# Patient Record
Sex: Male | Born: 1965 | Race: White | Hispanic: No | Marital: Married | Smoking: Never smoker
Health system: Southern US, Community
[De-identification: ages and names within clinical notes are randomized; demographics above are authoritative.]

## PROBLEM LIST (undated history)

## (undated) DIAGNOSIS — C801 Malignant (primary) neoplasm, unspecified: Secondary | ICD-10-CM

## (undated) DIAGNOSIS — I10 Essential (primary) hypertension: Secondary | ICD-10-CM

## (undated) DIAGNOSIS — Z923 Personal history of irradiation: Secondary | ICD-10-CM

## (undated) HISTORY — DX: Essential (primary) hypertension: I10

## (undated) HISTORY — PX: BACK SURGERY: SHX140

---

## 1998-04-29 ENCOUNTER — Ambulatory Visit (HOSPITAL_COMMUNITY): Admission: RE | Admit: 1998-04-29 | Discharge: 1998-04-29 | Payer: Self-pay | Admitting: Pulmonary Disease

## 1998-05-26 ENCOUNTER — Observation Stay (HOSPITAL_COMMUNITY): Admission: RE | Admit: 1998-05-26 | Discharge: 1998-05-26 | Payer: Self-pay | Admitting: Neurosurgery

## 1998-05-26 ENCOUNTER — Encounter: Payer: Self-pay | Admitting: Neurosurgery

## 2003-05-11 ENCOUNTER — Encounter: Payer: Self-pay | Admitting: Emergency Medicine

## 2003-05-11 ENCOUNTER — Emergency Department (HOSPITAL_COMMUNITY): Admission: EM | Admit: 2003-05-11 | Discharge: 2003-05-11 | Payer: Self-pay | Admitting: Emergency Medicine

## 2010-04-29 ENCOUNTER — Emergency Department (HOSPITAL_COMMUNITY): Admission: EM | Admit: 2010-04-29 | Discharge: 2010-04-29 | Payer: Self-pay | Admitting: Emergency Medicine

## 2010-10-28 LAB — URINALYSIS, ROUTINE W REFLEX MICROSCOPIC
Bilirubin Urine: NEGATIVE
Glucose, UA: NEGATIVE mg/dL
Ketones, ur: NEGATIVE mg/dL
Leukocytes, UA: NEGATIVE
Nitrite: NEGATIVE
Protein, ur: NEGATIVE mg/dL
Specific Gravity, Urine: >1.03 — ABNORMAL HIGH (ref 1.005–1.030)
Urobilinogen, UA: 0.2 mg/dL (ref 0.0–1.0)
pH: 5.5 (ref 5.0–8.0)

## 2010-10-28 LAB — URINE MICROSCOPIC-ADD ON

## 2015-09-30 ENCOUNTER — Ambulatory Visit (INDEPENDENT_AMBULATORY_CARE_PROVIDER_SITE_OTHER): Payer: 59 | Admitting: Orthopaedic Surgery

## 2015-09-30 ENCOUNTER — Ambulatory Visit: Payer: Self-pay | Admitting: Orthopaedic Surgery

## 2015-09-30 ENCOUNTER — Encounter: Payer: Self-pay | Admitting: Orthopaedic Surgery

## 2015-09-30 ENCOUNTER — Ambulatory Visit (INDEPENDENT_AMBULATORY_CARE_PROVIDER_SITE_OTHER): Payer: 59

## 2015-09-30 VITALS — BP 161/80 | HR 70 | Temp 97.9°F | Ht 74.0 in | Wt 267.4 lb

## 2015-09-30 DIAGNOSIS — M7750 Other enthesopathy of unspecified foot: Secondary | ICD-10-CM | POA: Insufficient documentation

## 2015-09-30 DIAGNOSIS — M79672 Pain in left foot: Secondary | ICD-10-CM

## 2015-09-30 DIAGNOSIS — M7752 Other enthesopathy of left foot: Secondary | ICD-10-CM

## 2015-09-30 MED ORDER — DICLOFENAC SODIUM 1 % TD GEL
4.0000 g | Freq: Four times a day (QID) | TRANSDERMAL | Status: DC
Start: 2015-09-30 — End: 2018-01-26

## 2015-09-30 NOTE — Progress Notes (Signed)
Patient Travis Mcdonald, male DOB:1966-08-03, 50 y.o. ZX:[id number]  Chief Complaint  Patient presents with  . Ankle Pain    LEFT ANKLE AND HEEL PAIN    HPI  Travis Mcdonald is a 50 y.o. male who has had four month history of left heel pain that is at the Achilles tendon insertion.  He has been followed by Dr. Delphina Cahill.  He has tried ice, shoe wear change and has been on diclofenac 75 po bid pc.  These have not helped much.  He has good and bad days.  He has no redness.  He says the ice helps the most.  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  By the end of the day his heel is very painful at times.   HPI  Body mass index is 34.32 kg/(m^2).  Review of Systems  Patient does not have Diabetes Mellitus. Patient has hypertension. Patient does not have COPD or shortness of breath. Patient does not have BMI > 35. Patient does not have current smoking history.  Review of Systems  Cardiovascular:       Hypertension   Musculoskeletal: Positive for gait problem. Joint swelling: left heel pain causes him to limp.  All other systems reviewed and are negative.   Past Medical History  Diagnosis Date  . Hypertension     Past Surgical History  Procedure Laterality Date  . Back surgery      History reviewed. No pertinent family history.  Social History Social History  Substance Use Topics  . Smoking status: Never Smoker   . Smokeless tobacco: Never Used  . Alcohol Use: No    No Known Allergies  Current Outpatient Prescriptions  Medication Sig Dispense Refill  . enalapril-hydrochlorothiazide (VASERETIC) 10-25 MG tablet     . diclofenac (VOLTAREN) 75 MG EC tablet Reported on 09/30/2015    . diclofenac sodium (VOLTAREN) 1 % GEL Apply 4 g topically 4 (four) times daily. 5 Tube 5   No current facility-administered medications for this visit.     Physical Exam  Blood pressure 161/80, pulse 70, temperature 97.9  F (36.6 C), height 6\' 2"  (1.88 m), weight 267 lb 6.4 oz (121.292 kg).  Constitutional: overall normal hygiene, normal nutrition, well developed, normal grooming, normal body habitus. Assistive device:none  Musculoskeletal: gait and station Limp slightly left, muscle tone and strength are normal, no tremors or atrophy is present.  .  Neurological: coordination overall normal.  Deep tendon reflex/nerve stretch intact.  Sensation normal.  Cranial nerves II-XII intact.   Skin:normal overall no scars, lesions, ulcers or rashes. No psoriasis.  Psychiatric: Alert and oriented x 3.  Recent memory intact, remote memory unclear.  Normal mood and affect. Well groomed.  Good eye contact.  Cardiovascular: overall no swelling, no varicosities, no edema bilaterally, normal temperatures of the legs and arms, no clubbing, cyanosis and good capillary refill.  Lymphatic: palpation is normal.   Extremities:his right foot and heel and ankle is normal.  He has tenderness of the left heel at the Achilles insertion with no redness or swelling. Inspection tender over the insertion of the Achilles tendon posterior and superiorly. Strength and tone normal Range of motion full and normal  Additional services performed: x-rays of the foot was done.  He has abnormal calcium prominence of the posterior superior area of the calcaneus and has no fracture.  I reviewed the x-rays with him and explained the findings.  I have recommended Voltaren Gel and use of ice to the area.    PLAN Call if any problems.  Precautions discussed.  Continue current medications.   Return to clinic 2 wks  I told him this will not be an easy thing to resolve and it will take some time.

## 2015-09-30 NOTE — Patient Instructions (Signed)
Get voltaren Gel and use as directed Use ice to the heel area

## 2015-11-11 ENCOUNTER — Ambulatory Visit: Payer: 59 | Admitting: Orthopaedic Surgery

## 2017-11-02 ENCOUNTER — Other Ambulatory Visit (HOSPITAL_COMMUNITY): Payer: Self-pay | Admitting: Internal Medicine

## 2017-11-02 DIAGNOSIS — R599 Enlarged lymph nodes, unspecified: Secondary | ICD-10-CM

## 2017-11-10 DIAGNOSIS — R221 Localized swelling, mass and lump, neck: Secondary | ICD-10-CM | POA: Insufficient documentation

## 2017-11-10 DIAGNOSIS — R42 Dizziness and giddiness: Secondary | ICD-10-CM | POA: Insufficient documentation

## 2017-11-15 ENCOUNTER — Ambulatory Visit (HOSPITAL_COMMUNITY): Payer: 59

## 2017-12-13 ENCOUNTER — Ambulatory Visit (HOSPITAL_COMMUNITY): Payer: PRIVATE HEALTH INSURANCE

## 2017-12-20 ENCOUNTER — Ambulatory Visit (HOSPITAL_COMMUNITY): Payer: PRIVATE HEALTH INSURANCE

## 2017-12-20 ENCOUNTER — Ambulatory Visit (HOSPITAL_COMMUNITY)
Admission: RE | Admit: 2017-12-20 | Discharge: 2017-12-20 | Disposition: A | Discharge status: Home / Self Care | Payer: PRIVATE HEALTH INSURANCE | Source: Ambulatory Visit | Attending: Internal Medicine | Admitting: Internal Medicine

## 2017-12-20 DIAGNOSIS — R599 Enlarged lymph nodes, unspecified: Secondary | ICD-10-CM

## 2017-12-20 DIAGNOSIS — R590 Localized enlarged lymph nodes: Secondary | ICD-10-CM | POA: Insufficient documentation

## 2017-12-26 DIAGNOSIS — R590 Localized enlarged lymph nodes: Secondary | ICD-10-CM | POA: Insufficient documentation

## 2017-12-28 ENCOUNTER — Other Ambulatory Visit (HOSPITAL_COMMUNITY): Payer: Self-pay | Admitting: Otolaryngology

## 2017-12-28 DIAGNOSIS — C770 Secondary and unspecified malignant neoplasm of lymph nodes of head, face and neck: Secondary | ICD-10-CM

## 2018-01-04 ENCOUNTER — Encounter (HOSPITAL_COMMUNITY)
Admission: RE | Admit: 2018-01-04 | Discharge: 2018-01-04 | Disposition: A | Discharge status: Home / Self Care | Payer: PRIVATE HEALTH INSURANCE | Source: Ambulatory Visit | Attending: Otolaryngology | Admitting: Otolaryngology

## 2018-01-04 DIAGNOSIS — C770 Secondary and unspecified malignant neoplasm of lymph nodes of head, face and neck: Secondary | ICD-10-CM

## 2018-01-04 LAB — GLUCOSE, CAPILLARY: GLUCOSE-CAPILLARY: 80 mg/dL (ref 65–99)

## 2018-01-04 MED ORDER — FLUDEOXYGLUCOSE F - 18 (FDG) INJECTION
14.1000 | Freq: Once | INTRAVENOUS | Status: AC | PRN
  Administered 2018-01-04: 14.1 via INTRAVENOUS

## 2018-01-10 ENCOUNTER — Telehealth: Payer: Self-pay | Admitting: *Deleted

## 2018-01-10 DIAGNOSIS — C099 Malignant neoplasm of tonsil, unspecified: Secondary | ICD-10-CM

## 2018-01-10 NOTE — Telephone Encounter (Signed)
Oncology Nurse Navigator Documentation  Placed introductory call to new referral patient.  Introduced myself as the H&N oncology nurse navigator that works with Dr. Isidore Moos to whom he has been referred by Dr. Lucia Gaskins.  He confirmed understanding of referral; he confirmed availability for 6/3 0730 NE/0800 Consult. I noted a Medical Oncology appt is pending.  I briefly explained my role as his navigator, indicated I would be joining him during his appt next week.  Answered his questions re tmt.  I explained the purpose of a dental evaluation prior to starting RT, indicated he wd be contacted by [facility] to arrange an appt within a day or so of his appt with Dr. Isidore Moos.  I confirmed his understanding of [place] location, explained arrival and RadOnc registration process for appt.  I provided my contact information, encouraged him to call with questions/concerns before next week.  He verbalized understanding of information provided, expressed appreciation for my call.  [name], RN, BSN Head & Neck Oncology Nurse [place] at [place] [phone]

## 2018-01-10 NOTE — Progress Notes (Signed)
Head and Neck Cancer Location of Tumor / Histology:  12/27/17 Left neck anterior node revealed malignant cells consistent with poorly differentiated carcinoma.   Patient presented with symptoms of: He presented to the doctor with an enlarging "knot" to his left neck. He first noted it late February/ early March.   Biopsies of Left neck anterior lymph node revealed: Malignant cells consistent with poorly differentiated carcinoma.   Nutrition Status Yes No Comments  Weight changes? []  [x]    Swallowing concerns? []  [x]    PEG? []  [x]     Referrals Yes No Comments  Social Work? []  [x]    Dentistry? [x]  []  Dr. Enrique Sack 01/15/18  Swallowing therapy? []  [x]    Nutrition? []  [x]    Med/Onc? [x]  []  Dr. Irene Limbo 01/24/18   Safety Issues Yes No Comments  Prior radiation? []  [x]    Pacemaker/ICD? []  [x]    Possible current pregnancy? []  [x]    Is the patient on methotrexate? []  [x]     Tobacco/Marijuana/Snuff/ETOH use: He has never smoked. He does not drink alcohol.   Past/Anticipated interventions by otolaryngology, if any:  Dr. Lucia Gaskins performed FNA 12/27/17  Past/Anticipated interventions by medical oncology, if any:  Dr. Irene Limbo 01/24/18   Current Complaints / other details:   01/04/18 PET scan IMPRESSION: 1. Extensive hypermetabolic activity in the left neck, index lesion maximum SUV 20.3. Looking for a primary lesion in the neck, there is some very faint asymmetry of the left palatine tonsillar pillar which could conceivably represent a small lesion. 2. No significant abnormal activity in the chest, abdomen/pelvis, or skeleton. 3. Other imaging findings of potential clinical significance: Aortic Atherosclerosis (ICD10-I70.0). Coronary atherosclerosis. Small mucous retention cyst in left maxillary sinus. Diffuse hepatic steatosis. Nonspecific but probably benign hypodense lesions in the lateral segment left hepatic lobe.  BP 135/89 (BP Location: Right Arm, Patient Position: Sitting, Cuff Size:  Large)   Pulse 66   Temp 97.7 F (36.5 C) (Oral)   Resp 18   Ht 6\' 2"  (1.88 m)   Wt 279 lb (126.6 kg)   SpO2 96%   BMI 35.82 kg/m    Wt Readings from Last 3 Encounters:  01/15/18 279 lb (126.6 kg)  09/30/15 267 lb 6.4 oz (121.3 kg)

## 2018-01-11 ENCOUNTER — Telehealth: Payer: Self-pay | Admitting: Hematology

## 2018-01-11 ENCOUNTER — Encounter: Payer: Self-pay | Admitting: Hematology

## 2018-01-11 NOTE — Telephone Encounter (Signed)
Appt has been scheduled for the pt to see Dr. Irene Limbo on 6/12 at 11am. Pt has agreed to the appt date and time. Letter mailed to the pt.

## 2018-01-15 ENCOUNTER — Other Ambulatory Visit: Payer: Self-pay

## 2018-01-15 ENCOUNTER — Ambulatory Visit
Admission: RE | Admit: 2018-01-15 | Discharge: 2018-01-15 | Disposition: A | Discharge status: Home / Self Care | Payer: PRIVATE HEALTH INSURANCE | Source: Ambulatory Visit | Attending: Radiation Oncology | Admitting: Radiation Oncology

## 2018-01-15 ENCOUNTER — Ambulatory Visit (HOSPITAL_COMMUNITY): Payer: Self-pay | Admitting: Dentistry

## 2018-01-15 ENCOUNTER — Encounter (HOSPITAL_COMMUNITY): Payer: Self-pay | Admitting: Dentistry

## 2018-01-15 ENCOUNTER — Encounter: Payer: Self-pay | Admitting: Radiation Oncology

## 2018-01-15 ENCOUNTER — Encounter: Payer: Self-pay | Admitting: *Deleted

## 2018-01-15 VITALS — BP 137/84 | HR 60 | Temp 98.1°F

## 2018-01-15 VITALS — BP 135/89 | HR 66 | Temp 97.7°F | Resp 18 | Ht 74.0 in | Wt 279.0 lb

## 2018-01-15 DIAGNOSIS — K08409 Partial loss of teeth, unspecified cause, unspecified class: Secondary | ICD-10-CM

## 2018-01-15 DIAGNOSIS — R21 Rash and other nonspecific skin eruption: Secondary | ICD-10-CM | POA: Insufficient documentation

## 2018-01-15 DIAGNOSIS — K036 Deposits [accretions] on teeth: Secondary | ICD-10-CM

## 2018-01-15 DIAGNOSIS — R51 Headache: Secondary | ICD-10-CM | POA: Diagnosis not present

## 2018-01-15 DIAGNOSIS — M2632 Excessive spacing of fully erupted teeth: Secondary | ICD-10-CM

## 2018-01-15 DIAGNOSIS — M270 Developmental disorders of jaws: Secondary | ICD-10-CM

## 2018-01-15 DIAGNOSIS — Z79899 Other long term (current) drug therapy: Secondary | ICD-10-CM | POA: Diagnosis not present

## 2018-01-15 DIAGNOSIS — K0889 Other specified disorders of teeth and supporting structures: Secondary | ICD-10-CM

## 2018-01-15 DIAGNOSIS — M264 Malocclusion, unspecified: Secondary | ICD-10-CM

## 2018-01-15 DIAGNOSIS — I10 Essential (primary) hypertension: Secondary | ICD-10-CM | POA: Diagnosis not present

## 2018-01-15 DIAGNOSIS — R599 Enlarged lymph nodes, unspecified: Secondary | ICD-10-CM | POA: Diagnosis not present

## 2018-01-15 DIAGNOSIS — F40240 Claustrophobia: Secondary | ICD-10-CM | POA: Insufficient documentation

## 2018-01-15 DIAGNOSIS — H9202 Otalgia, left ear: Secondary | ICD-10-CM | POA: Insufficient documentation

## 2018-01-15 DIAGNOSIS — K06010 Localized gingival recession, unspecified: Secondary | ICD-10-CM

## 2018-01-15 DIAGNOSIS — K0530 Chronic periodontitis, unspecified: Secondary | ICD-10-CM

## 2018-01-15 DIAGNOSIS — K031 Abrasion of teeth: Secondary | ICD-10-CM

## 2018-01-15 DIAGNOSIS — C090 Malignant neoplasm of tonsillar fossa: Secondary | ICD-10-CM | POA: Diagnosis present

## 2018-01-15 DIAGNOSIS — Z01818 Encounter for other preprocedural examination: Secondary | ICD-10-CM

## 2018-01-15 DIAGNOSIS — K030 Excessive attrition of teeth: Secondary | ICD-10-CM

## 2018-01-15 MED ORDER — LORAZEPAM 0.5 MG PO TABS: ORAL_TABLET | ORAL | 0 refills | Status: DC

## 2018-01-15 MED ORDER — SODIUM FLUORIDE 1.1 % DT GEL: DENTAL | 99 refills | Status: DC

## 2018-01-15 NOTE — Progress Notes (Signed)
Oncology Nurse Navigator Documentation  Met with Mr. Nielson during initial consult with Dr. Isidore Moos.  He was accompanied by his wife.   1. Further introduced myself as his Navigator, explained my role as a member of the Care Team.   2. Provided New Patient Information packet, discussed contents:  Contact information for physician(s), myself, other members of the Care Team.  Advance Directive information ([place] blue pamphlet with LCSW contact info); provided AD document.  Fall Prevention Patient Safety Plan  Appointment Guideline  Financial Assistance Information sheet  [place] with highlight of [place]  SLP information sheet 3. Provided introductory explanation of radiation treatment including SIM planning and purpose of Aquaplast head and shoulder mask, showed them example.  He stated he needed medication in order to complete PET d/t claustrophobia, would prefer open-face mask.  Dr. Isidore Moos Rxed Ativan for upcoming SIM, tmt. 4. Provided and discussed education handouts for PEG and PAC.  5. We discussed H&N MDC, provided tentative 6/25 attendance.  6. Of note:   Freight forwarder for [employer], salaried.  His manager aware of dx, very supportive, providing flexibility re work hours.  3 teen grandchildren live at home with him and his wife. 7. Escorted them to Dental Medicine for 0900 appt, delivered Anticipated [place] sheet. 8. I encouraged them to contact me with questions/concerns as treatments/procedures begin.  They verbalized understanding of information provided, understand I plan to join them for 6/12 consult with Dr. Irene Limbo.    [name], RN, BSN Head & Neck Oncology Nurse [place] at [place] [phone]

## 2018-01-15 NOTE — Progress Notes (Signed)
DENTAL CONSULTATION  Date of Consultation:  01/15/2018 Patient Name:   Travis Mcdonald Date of Birth:   [birth year] Medical Record Number: [id number]  VITALS: BP 137/84 (BP Location: Right Arm)   Pulse 60   Temp 98.1 F (36.7 C)   CHIEF COMPLAINT: Patient referred by Dr. Isidore Moos for a dental consultation.  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 Patient with anticipated chemoradiation therapy. Patient is now seen as part of a medically necessary pre-chemoradiation therapy dental protocol examination.  The patient currently denies having any acute toothaches, swellings, or abscesses. Patient was last seen by dentist 10-15 years ago to have an exam and cleaning. This was with Dr. Wynetta Emery in [place], [place]. Dr. Wynetta Emery has since retired. Patient denies having any partial dentures. 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  PROBLEM LIST: Patient Active Problem List   Diagnosis Date Noted  . Carcinoma of tonsillar fossa ([place]) 01/15/2018    Priority: High  . Retrocalcaneal bursitis (back of heel) 09/30/2015    PMH: Past Medical History:  Diagnosis Date  . Hypertension     PSH: Past Surgical History:  Procedure Laterality Date  . [place]   ruptured lower back disc surgery. L4 & L5 Lumber disc    ALLERGIES: No Known Allergies  MEDICATIONS: Current Outpatient Medications  Medication Sig Dispense Refill  . acetaminophen (TYLENOL) 325 MG tablet Take 650 mg by mouth every 6 (six) hours as needed.    . enalapril-hydrochlorothiazide (VASERETIC) 10-25 MG tablet     . ibuprofen (ADVIL,MOTRIN) 200 MG tablet Take 200 mg by mouth every 6 (six) hours as needed.    . diclofenac sodium (VOLTAREN) 1 % GEL Apply 4 g topically 4 (four) times daily. (Patient not taking: Reported on 01/15/2018) 5 Tube 5  . LORazepam (ATIVAN) 0.5 MG tablet Take 1-2 tablets PO 30 min before wearing RT  mask, PRN anxiety. (Patient not taking: Reported on 01/15/2018) 12 tablet 0   No current facility-administered medications for this visit.     LABS: No results found for: WBC, HGB, HCT, MCV, PLT No results found for: NA, K, CL, CO2, GLUCOSE, BUN, CREATININE, CALCIUM, GFRNONAA, GFRAA No results found for: INR, PROTIME No results found for: PTT  SOCIAL HISTORY: Social History   Socioeconomic History  . Marital status: Married    Spouse name: Not on file  . Number of children: Not on file  . Years of education: Not on file  . Highest education level: Not on file  Occupational History  . Not on file  Social Needs  . Financial resource strain: Not on file  . Food insecurity:    Worry: Not on file    Inability: Not on file  . Transportation needs:    Medical: Not on file    Non-medical: Not on file  Tobacco Use  . Smoking status: Never Smoker  . Smokeless tobacco: Never Used  Substance and Sexual Activity  . Alcohol use: No    Comment: no alcohol for 30 years.   . Drug use: No  . Sexual activity: Not on file  Lifestyle  . Physical activity:    Days per week: Not on file    Minutes per session: Not on file  . Stress: Not on file  Relationships  . Social connections:    Talks on phone: Not on file    Gets together: Not on file  Attends religious service: Not on file    Active member of club or organization: Not on file    Attends meetings of clubs or organizations: Not on file    Relationship status: Not on file  . Intimate partner violence:    Fear of current or ex partner: Not on file    Emotionally abused: Not on file    Physically abused: Not on file    Forced sexual activity: Not on file  Other Topics Concern  . Not on file  Social History Narrative  . Not on file    FAMILY HISTORY: History reviewed. No pertinent family history.  REVIEW OF SYSTEMS: Reviewed with the patient as per History of present illness. Psych: Patient does have a history of dental  phobia.  DENTAL HISTORY: CHIEF COMPLAINT: Patient referred by Dr. Isidore Moos for a dental consultation.  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 Patient with anticipated chemoradiation therapy. Patient is now seen as part of a medically necessary pre-chemoradiation therapy dental protocol examination.  The patient currently denies having any acute toothaches, swellings, or abscesses. Patient was last seen by dentist 10-15 years ago to have an exam and cleaning. This was with Dr. Wynetta Emery in [place], [place]. Dr. Wynetta Emery has since retired. Patient denies having any partial dentures. 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  DENTAL EXAMINATION: GENERAL:  The patient is a well-developed, well-nourished male in no acute distress. HEAD AND NECK:  The patient has left neck lymphadenopathy. This is difficult to palpate secondary to a very large and extensive beard. The patient denies acute TMJ symptoms. INTRAORAL EXAM: The patient has normal saliva. The patient has bilateral mandibular lingual tori. DENTITION:  The patient is missing tooth #4 only. Patient has all wisdom teeth that are erupted. Multiple flexure lesions are noted. There is evidence of incisal attrition. PERIODONTAL:  Patient has chronic periodontitis with plaque and calculus accumulations, selective areas of gingival recession, and mandibular anterior incipient tooth mobility. There is incipient to moderate bone loss noted. Radiographic calculus is noted. DENTAL CARIES/SUBOPTIMAL RESTORATIONS:  No obvious dental caries were noted. The patient has multiple flexure lesions. ENDODONTIC:  The patient currently denies acute pulpitis symptoms. There is no evidence of periapical pathology or radiolucency. CROWN AND BRIDGE:  There are no crown or bridge restorations. PROSTHODONTIC:  There are no partial dentures. OCCLUSION:  Patient has a poor occlusal  scheme secondary to missing tooth and lack of replacement of the missing teeth with dental prosthesis.  RADIOGRAPHIC INTERPRETATION: An orthopantogram was taken and supplemented with a full series of dental radiographs. There is a missing tooth #4. There is incipient to moderate bone loss. Radiographic calculus is noted. Multiple diastemas are noted. There is supra-eruption and drifting of the unopposed teeth into the edentulous areas. There are mandibular radiopacities consistent with bilateral mandibular lingual tori.   ASSESSMENTS: 1. 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 Pre-chemoradiation therapy dental protocol 3. Chronic periodontitis with bone loss 4. Accretions 5. Localize gingival recession 6. Incipient tooth mobility 7. Multiple flexure lesions 8. Maxillary mandibular incisal attrition 9. Missing tooth #4 10. Poor occlusal scheme but a stable occlusion. 11. History of dental phobia  PLAN/RECOMMENDATIONS: 1. I discussed the risks, benefits, and complications of various treatment options with the patient in relationship to his medical and dental conditions, anticipated chemoradiation therapy, and chemoradiation therapy side effects to include xerostomia, radiation caries, trismus, mucositis,taste changes, gum  and jawbone changes, and risk for infection and osteoradionecrosis. We discussed various treatment options to include no treatment, multiple extractions with alveoloplasty, pre-prosthetic surgery as indicated, periodontal therapy, dental restorations, root canal therapy, crown and bridge therapy, implant therapy, and replacement of missing teeth as indicated. We also discussed referral to an oral surgeon for extractions as indicated. We also discussed impressions today for the fabrication of fluoride trays and scatter protection devices.The patient currently wishes to proceed with referral to an oral surgeon for evaluation for extraction of tooth numbers 1, 16, 17,  and 32. This is scheduled with Dr. Frederik Schmidt for Tuesday, 01/16/2018 at 3:15 PM.  Patient also agreed to impressions today for the future fabrication of fluoride trays and scatter protection devices.  We will provide initial gross debridement procedure as well as insertion of fluoride trays and scatter protection devices.  A prescription for FluoriSHIELD was sent to [facility] with refills for one year with normal directions or use of fluoride tray.  2. Discussion of findings with medical team and coordination of future medical and dental care as needed.  I spent in excess of  120 minutes during the conduct of this consultation and >50% of this time involved direct face-to-face encounter for counseling and/or coordination of the patient's care.    Lenn Cal, DDS

## 2018-01-15 NOTE — Patient Instructions (Signed)

## 2018-01-16 NOTE — Progress Notes (Addendum)
Radiation Oncology         [phone] ________________________________  Initial outpatient Consultation  Name: Travis Mcdonald MRN: [id number]  Date: 01/15/2018  DOB: 01-Oct-1965  [id number], Edwinna Areola, MD  Rozetta Nunnery, *   REFERRING PHYSICIAN: Rozetta Nunnery, *  DIAGNOSIS:    ICD-10-CM   1. 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 male who presented with a mass adjacent to his left jaw at the beginning of this year. He initially tried sinus medication, which did not help. He ultimately presented to Dr. Nevada Crane who referred him to Dr. Lucia Gaskins of ENT.   Patient also developed thick saliva in his throat and left-sided ear pain and some scratchyness in his mid-throat. He does acknowledge that he has ear pain due to recurrent ear infections in the past.   Patient reports that his dominant left neck mass was the size of a tennis ball when he saw Dr. Lucia Gaskins. Dr. Lucia Gaskins aspirated the mass on 12/27/2017, and this showed malignant cells associated with poorly differentiated carcinoma. This favorited squamous cell carcinoma. No p16 status could be determined based on the specimen.   The patient also endorses a rash on his right leg, as well as left sinus headaches that are not severe, occasional kidney stones, and claustrophobia.   He sees dentistry today and medical oncology on 01/24/2018. He is a non-drinker and has never smoked. He denies weight changes or swallowing concerns. No prior radiation or cancer. He has a long beard, which he understands will need to be shaved before radiation planning.     Pertinent imaging thus far includes a CT soft tissue neck without contrast performed on 12/21/2017 revealing numerous enlarged lymph nodes throughout the left jugular chain and posterior triangle. The left jugulodigastric node measures up to 4 cm and is cavitary. Pattern is primarily concerning for metastatic squamous cell carcinoma. No primary lesion is seen.   He also had a PETperformed on 01/04/2018 revealing extensive hypermetabolic activity in the left neck, index lesion maximum SUV 20.3. Looking for a primary lesion in the neck, there is some very faint asymmetry of the left palatine tonsillar pillar which could conceivably represent a small lesion. No significant abnormal activity in the chest, abdomen/pelvis, or skeleton. Other imaging findings of potential clinical significance: Aortic Atherosclerosis (ICD10-I70.0). Coronary atherosclerosis. Small mucous retention cyst in left maxillary sinus. Diffuse hepatic steatosis. Nonspecific but probably benign hypodense lesions in the lateral segment left hepatic lobe.  I have reviewed his images  PREVIOUS RADIATION THERAPY: No  PAST MEDICAL HISTORY:  has a past medical history of Hypertension.    PAST SURGICAL HISTORY: Past Surgical History:  Procedure Laterality Date  . [place]   ruptured lower back disc surgery. L4 & L5 Lumber disc    FAMILY HISTORY: family history is not on file.  SOCIAL HISTORY:  reports that he has never smoked. He has never used smokeless tobacco. He reports that he does not drink alcohol or use drugs.  ALLERGIES: Patient has no known allergies.  MEDICATIONS:  Current Outpatient Medications  Medication Sig Dispense Refill  .  acetaminophen (TYLENOL) 325 MG tablet Take 650 mg by mouth every 6 (six) hours as needed.    . enalapril-hydrochlorothiazide (VASERETIC) 10-25 MG tablet     . ibuprofen (ADVIL,MOTRIN) 200 MG tablet Take 200 mg by mouth every 6 (six) hours as needed.    . diclofenac sodium  (VOLTAREN) 1 % GEL Apply 4 g topically 4 (four) times daily. (Patient not taking: Reported on 01/15/2018) 5 Tube 5  . LORazepam (ATIVAN) 0.5 MG tablet Take 1-2 tablets PO 30 min before wearing RT mask, PRN anxiety. (Patient not taking: Reported on 01/15/2018) 12 tablet 0  . sodium fluoride (FLUORISHIELD) 1.1 % GEL dental gel Instill one drop of gel per tooth space of fluoride tray. Place over teeth for 5 minutes. Remove. Spit out excess. Repeat nightly. 120 mL prn   No current facility-administered medications for this encounter.     REVIEW OF SYSTEMS:  A 10+ POINT REVIEW OF SYSTEMS WAS OBTAINED including neurology, dermatology, psychiatry, cardiac, respiratory, lymph, extremities, GI, GU, Musculoskeletal, constitutional, breasts, reproductive, HEENT.  All pertinent positives are noted in the HPI.  All others are negative.    PHYSICAL EXAM:  height is 6\' 2"  (1.88 m) and weight is 279 lb (126.6 kg). His oral temperature is 97.7 F (36.5 C). His blood pressure is 135/89 and his pulse is 66. His respiration is 18 and oxygen saturation is 96%.   General: Alert and oriented, in no acute distress HEENT: Head is normocephalic. Extraocular movements are intact. Oropharynx is notable for asymmetric enlargement of left tonsil Neck: Neck is notable for dominant left upper cervical neck mass, approx 4 cm, with adjacent masses suspicious for nodal involvement.  Long beard inhibits skin / neck exam. Heart: Regular in rate and rhythm with no murmurs, rubs, or gallops. Chest: Clear to auscultation bilaterally, with no rhonchi, wheezes, or rales. Abdomen: Soft, nontender, nondistended, with no rigidity or guarding. Extremities: No cyanosis or edema. Lymphatics: see Neck Exam Skin: red rash, lower right leg Musculoskeletal: symmetric strength and muscle tone throughout. Neurologic: Cranial nerves II through XII are grossly intact. No obvious focalities. Speech is fluent. Coordination is intact. Psychiatric:  Judgment and insight are intact. Affect is appropriate.   ECOG = 0  0 - Asymptomatic (Fully active, able to carry on all predisease activities without restriction)  1 - Symptomatic but completely ambulatory (Restricted in physically strenuous activity but ambulatory and able to carry out work of a light or sedentary nature. For example, light housework, office work)  2 - Symptomatic, <50% in bed during the day (Ambulatory and capable of all self care but unable to carry out any work activities. Up and about more than 50% of waking hours)  3 - Symptomatic, >50% in bed, but not bedbound (Capable of only limited self-care, confined to bed or chair 50% or more of waking hours)  4 - Bedbound (Completely disabled. Cannot carry on any self-care. Totally confined to bed or chair)  5 - Death   Eustace Pen MM, Creech RH, Tormey DC, et al. [phone]). "Toxicity and response criteria of the [facility]". [place] Oncol. 5 (6): 649-55   LABORATORY DATA:  No results found for: WBC, HGB, HCT, MCV, PLT CMP  No results found for: NA, K, CL, CO2, GLUCOSE, BUN, CREATININE, CALCIUM, PROT, ALBUMIN, AST, ALT, ALKPHOS, BILITOT, GFRNONAA, GFRAA    No results found for: TSH    RADIOGRAPHY: I have personally reviewed the patients imaging and studies.   Ct Soft Tissue Neck  Wo Contrast  Result Date: 12/21/2017 CLINICAL DATA:  Swollen lymph nodes on the left for 6 months. EXAM: CT NECK WITHOUT CONTRAST TECHNIQUE: Multidetector CT imaging of the neck was performed following the standard protocol without intravenous contrast. COMPARISON:  None. FINDINGS: Pharynx and larynx: No detected mass or asymmetry. Salivary glands: No inflammation, mass, or stone. Thyroid: Normal. Lymph nodes: Numerous enlarged lymph nodes throughout the left jugular chain, reaching the posterior triangle. The largest is the jugulodigastric node which extends inferiorly to the level of the hyoid and has a cystic component, 4.4  cm in maximal span. No contralateral adenopathy. Vascular: Mild atherosclerotic calcification. Limited intracranial: Negative Visualized orbits: Negative Mastoids and visualized paranasal sinuses: Clear Skeleton: None enlarged or abnormal density Upper chest: Negative These results will be called to the ordering clinician or representative by the Radiologist Assistant, and communication documented in the PACS or zVision Dashboard. IMPRESSION: Numerous enlarged lymph nodes throughout the left jugular chain and posterior triangle. The left jugulodigastric node measures up to 4 cm and is cavitary. Pattern is primarily concerning for metastatic squamous cell carcinoma. No primary lesion is seen. Recommend ENT referral. Electronically Signed   By: Monte Fantasia M.D.   On: 12/21/2017 07:56   Nm Pet Image Initial (pi) Skull Base To Thigh  Result Date: 01/04/2018 CLINICAL DATA:  Initial treatment strategy for malignant left cervical lymph node. EXAM: NUCLEAR MEDICINE PET SKULL BASE TO THIGH TECHNIQUE: 14.1 mCi F-18 FDG was injected intravenously. Full-ring PET imaging was performed from the skull base to thigh after the radiotracer. CT data was obtained and used for attenuation correction and anatomic localization. Fasting blood glucose: 80 mg/dl COMPARISON:  CT neck 12/20/2017 FINDINGS: Mediastinal blood pool activity: SUV max 2.3 NECK: Enlarged and hypermetabolic adenopathy in the left neck including levels IIa, IIb, III, V, VI, and IV. The dominant level IIa ill-defined nodal cluster measures 2.8 by 2.9 cm on image 33/4 and has a maximum SUV of 20.3 Just below this there is a photopenic cystic structure measuring 3.6 by 2.8 cm. With regard to a primary lesion, there is not a highly obvious structure. There is some faintly asymmetric activity in the left palatine tonsillar pillar, maximum SUV 5.3, contralateral side 3.8. Incidental CT findings: Small mucous retention cyst in the left maxillary sinus. CHEST: No  significant abnormal hypermetabolic activity in this region. Incidental CT findings: Coronary and aortic atherosclerotic calcification. Mild calcification of the aortic valve. ABDOMEN/PELVIS: Physiologic activity in bowel. Incidental CT findings: Diffuse hepatic steatosis with fatty sparing of the parenchymal along the gallbladder fossa. Several hypodense lesions in the lateral segment left hepatic lobe are not hypermetabolic and accordingly most likely to be benign. Periampullary duodenal diverticulum. Aortoiliac atherosclerotic vascular disease. SKELETON: No significant abnormal hypermetabolic activity in this region. Incidental CT findings: Lower lumbar spondylosis and degenerative disc disease. IMPRESSION: 1. Extensive hypermetabolic activity in the left neck, index lesion maximum SUV 20.3. Looking for a primary lesion in the neck, there is some very faint asymmetry of the left palatine tonsillar pillar which could conceivably represent a small lesion. 2. No significant abnormal activity in the chest, abdomen/pelvis, or skeleton. 3. Other imaging findings of potential clinical significance: Aortic Atherosclerosis (ICD10-I70.0). Coronary atherosclerosis. Small mucous retention cyst in left maxillary sinus. Diffuse hepatic steatosis. Nonspecific but probably benign hypodense lesions in the lateral segment left hepatic lobe. Electronically Signed   By: Van Clines M.D.   On: 01/04/2018 16:39      IMPRESSION/PLAN: This is a delightful patient with head  and neck cancer. I  recommend radiotherapy for this patient.  (After consult, ENT tumor board discussed his case on 01-17-18 - this is almost certainly a left tonsil cancer based on the imaging.  Highly favored to be HPV positive based on lack of smoking history or ETOH history.  Pathologist confident of squamous cell histology).  We discussed the potential risks, benefits, and side effects of radiotherapy. We talked in detail about acute and late effects.  We discussed that some of the most bothersome acute effects may be mucositis, dysgeusia, salivary changes, skin irritation, hair loss, dehydration, weight loss and fatigue. We talked about late effects which include but are not necessarily limited to dysphagia, hypothyroidism, nerve injury, spinal cord injury, xerostomia, trismus, and neck edema. No guarantees of treatment were given. A consent form was signed and placed in the patient's medical record. The patient is enthusiastic about proceeding with treatment. I look forward to participating in the patient's care.    Simulation (treatment planning) will take place after dentistry clearance  We also discussed that the treatment of head and neck cancer is a multidisciplinary process to maximize treatment outcomes and quality of life. For this reason the following referrals have been or will be made:   Medical oncology to discuss chemotherapy - highly favor chemotherapy with RT.  Numerous positive nodes are worrisome in neck   Dentistry for dental evaluation, possible extractions in the radiation fields, and /or advice on reducing risk of cavities, osteoradionecrosis, or other oral issues.   Nutritionist for nutrition support during and after treatment.   Speech language pathology for swallowing and/or speech therapy.   Social work for social support.    Physical therapy due to risk of lymphedema in neck and deconditioning.   Baseline labs including TSH.   Ativan Rx'd for mask tolerance during RT - claustrophobic __________________________________________   Eppie Gibson, MD

## 2018-01-17 ENCOUNTER — Other Ambulatory Visit: Payer: Self-pay | Admitting: Radiation Oncology

## 2018-01-17 ENCOUNTER — Encounter: Payer: Self-pay | Admitting: Radiation Oncology

## 2018-01-17 DIAGNOSIS — Z1329 Encounter for screening for other suspected endocrine disorder: Secondary | ICD-10-CM

## 2018-01-17 DIAGNOSIS — C090 Malignant neoplasm of tonsillar fossa: Secondary | ICD-10-CM

## 2018-01-18 ENCOUNTER — Encounter: Payer: Self-pay | Admitting: General Practice

## 2018-01-18 NOTE — Progress Notes (Signed)
[facility] CSW Progress Notes  Referral received from Dr Isidore Moos for "social support" for patient.  Patient is new diagnosis of head/neck cancer.  CSW attempted to reach patient by phone at home/mobile number.  Left VM requesting call back, will assess for needs at that time.  Edwyna Shell, LCSW Clinical Social Worker Phone:  [phone]

## 2018-01-19 ENCOUNTER — Ambulatory Visit: Payer: PRIVATE HEALTH INSURANCE | Admitting: Radiation Oncology

## 2018-01-19 ENCOUNTER — Telehealth: Payer: Self-pay | Admitting: General Practice

## 2018-01-19 NOTE — Telephone Encounter (Signed)
[facility] CSW Progress Note  Spoke w patient's daughter who was driving patient home after dental procedure, patient unable to speak. Per daughter, "I think we are doing pretty well", no concerns/needs at this time.  Reviewed options for help w [place], will mail information packet w details of opportunities and contact information.  Encouraged to contact as needed.  Edwyna Shell, LCSW Clinical Social Worker Phone:  [phone]

## 2018-01-22 ENCOUNTER — Encounter: Payer: Self-pay | Admitting: *Deleted

## 2018-01-22 ENCOUNTER — Telehealth: Payer: Self-pay | Admitting: *Deleted

## 2018-01-22 ENCOUNTER — Encounter (HOSPITAL_COMMUNITY): Payer: Self-pay | Admitting: Dentistry

## 2018-01-22 ENCOUNTER — Ambulatory Visit (HOSPITAL_COMMUNITY): Payer: Medicaid - Dental | Admitting: Dentistry

## 2018-01-22 VITALS — BP 131/84 | HR 68 | Temp 98.2°F

## 2018-01-22 DIAGNOSIS — C090 Malignant neoplasm of tonsillar fossa: Secondary | ICD-10-CM

## 2018-01-22 DIAGNOSIS — Z463 Encounter for fitting and adjustment of dental prosthetic device: Secondary | ICD-10-CM

## 2018-01-22 DIAGNOSIS — Z01818 Encounter for other preprocedural examination: Secondary | ICD-10-CM

## 2018-01-22 DIAGNOSIS — K08199 Complete loss of teeth due to other specified cause, unspecified class: Secondary | ICD-10-CM

## 2018-01-22 NOTE — Progress Notes (Signed)
Oncology Nurse Navigator Documentation  Met with patient s/p Dental Medicine appt. Provided Epic appt calendar, reviewed currently scheduled appts, particularly those scheduled for this Wed including CT SIM.   He voiced understanding of information provided.  [name], RN, BSN Head & Neck Oncology Nurse [place] [facility] [place] [phone]

## 2018-01-22 NOTE — Telephone Encounter (Signed)
A user error has taken place: encounter opened in error, closed for administrative reasons.

## 2018-01-22 NOTE — Telephone Encounter (Signed)
Oncology Nurse Navigator Documentation  Spoke to [facility] with oral surgeon Dr. Sheran Fava office.  She indicated Travis Mcdonald has 6/17 appt for follow-up for 6/7 extractions and per her conversation with Dr. Benson Norway, he is expected to be released to start RT at that visit.  [name], RN, BSN Head & Neck Oncology Nurse [place] [facility] [place] [phone]

## 2018-01-22 NOTE — Progress Notes (Signed)
01/22/2018  Patient Name:   Travis Mcdonald Date of Birth:   1965-10-26 Medical Record Number: [id number]  BP 131/84 (BP Location: Right Arm)   Pulse 68   Temp 98.2 F (36.8 C)   TIARA BARTOLI now presents for insertion of upper and lower fluoride trays and scatter protection devices. Patient had his wisdom teeth numbers 1, 16, 17, and 32 extracted by the oral surgeon on 01/19/2018. Patient with anticipated start of radiation therapy no earlier than 02/05/2018.  Dr. Benson Norway to provide clearance for start of chemoradiation therapy.  SUBJECTIVE: Patient is complaining of some soreness for the lower left extraction site. Patient has contacted Dr. Raynelle Dick office and was informed that the lower left extraction was more difficult and the pain from that areas consistent with that type of dental extraction.  OBJECTIVE: There is no sign of infection, heme, or ooze from dental extraction sites. Sutures are intact. Extraction sites appear to be healing in well.  ASSESSMENT: Loss of teeth due to extraction of oral surgeon Patient appears to be healing in well from dental extraction sites  PROCEDURE: Appliances were tried in and adjusted as needed. [place] Trismus device was previously fabricated at 50 mm using 25 sticks. Postop instructions were provided and a written and verbal format concerning the use and care of appliances. Prescription for FluoriSHIELD has been sent to [facility] with refills for one year. All questions were answered. Patient to return to clinic for periodic oral examination in approximately 3-4 weeks during radiation therapy. We'll attempt to schedule gross debridement procedure prior to start of chemoradiation therapy as time and space permits.  The patient currently refuses to have gross debridement procedure-today. Patient to call if questions or problems arise before then.  Lenn Cal, DDS

## 2018-01-22 NOTE — Patient Instructions (Signed)
1. Brush teeth after meals and at bedtime. Floss at bedtime. 2. Use fluoride trays with fluoride at bedtime as instructed. 3. Perform trismus exercises daily. 4. We will attempt to schedule for gross treatment procedure as time and space permits prior to start of chemoradiation therapy. 5. We will need to see patient for an oral examination during Radiation therapy after 2-3 weeks of chemoradiation therapy. 6. Follow-up with Dr. Raynelle Dick as scheduled. Call if problems arise before then.   Dr. Enrique Sack

## 2018-01-22 NOTE — Progress Notes (Signed)
Has armband been applied?  Yes  Does patient have an allergy to IV contrast dye?: No   Has patient ever received premedication for IV contrast dye?: N/A  Does patient take metformin?: No  If patient does take metformin when was the last dose: N/A  Date of lab work: 01/24/18 (today) BUN: 16 CR: 0.87 EGFR: greater than  60  IV site: Right AC  Has IV site been added to flowsheet?  Yes

## 2018-01-23 ENCOUNTER — Other Ambulatory Visit: Payer: Self-pay | Admitting: Radiation Oncology

## 2018-01-23 NOTE — Progress Notes (Signed)
HEMATOLOGY/ONCOLOGY CONSULTATION NOTE  Date of Service: 01/24/2018  Patient Care Team: Celene Squibb, MD as PCP - General (Internal Medicine) Rozetta Nunnery, MD as Consulting Physician (Otolaryngology) Eppie Gibson, MD as Attending Physician (Radiation Oncology) Brunetta Genera, MD as Consulting Physician (Hematology) Leota Sauers, RN as Oncology Nurse Navigator Karie Mainland, RD as Dietitian (Nutrition) Valentino Saxon [place], CCC-SLP as Speech Language Pathologist (Speech Pathology) Wynelle Beckmann, Melodie Bouillon, PT as Physical Therapist (Physical Therapy) Kennith Center, LCSW as Social Worker  CHIEF COMPLAINTS/PURPOSE OF CONSULTATION:  Tonsillar carcinoma   HISTORY OF PRESENTING ILLNESS:   Travis Mcdonald is a wonderful 52 y.o. 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 He is accompanied today by his wife. The pt reports that he is doing well overall.   The pt reports first noticing an enlargement of his left neck around January 2019. He believed these to be sinus related since he took sinus medications which decreased the swelling. He notes aggravating pain but denies significant pain or problems swallowing. He reports left ear and jaw pain as well.   Four teeth extraction on 01/19/18 with radiation planning this afternoon and tentative radiation beginning 02/05/18.   He denies any ear infection concerns after his audiogram. He denies recurrent ear infections but notes that he had two ear infections last year.    The pt notes that he will try to continue work each day.  He adds that he has a skin rash on his right upper leg which is recurrent in the summers. He denies seeing a dermatologist in the past and treats the associated itching with cortisone and benadryl.   He notes that he does snore at night and his wife notes that he does not snore when he sleeps on his side.   Of note prior to the patient's visit  today, pt has had PET/CT completed on 01/04/18 with results revealing Extensive hypermetabolic activity in the left neck, index lesion maximum SUV 20.3. Looking for a primary lesion in the neck, there is some very faint asymmetry of the left palatine tonsillar pillar which could conceivably represent a small lesion. 2. No significant abnormal activity in the chest, abdomen/pelvis, or skeleton. 3. Other imaging findings of potential clinical significance: Aortic Atherosclerosis (ICD10-I70.0). Coronary atherosclerosis. Small mucous retention cyst in left maxillary sinus. Diffuse hepatic steatosis. Nonspecific but probably benign hypodense lesions in the lateral segment left hepatic lobe.   Most recent lab results (01/24/18) of CBC w/diff, and CMP  is as follows: all values are WNL except glucose at 162. Magnesium 01/24/18 is WNL at 1.8 Phosphorus 01/24/18 is WNL at 2.7  On review of systems, pt reports left ear pain, left neck discomfort, left jaw pain, upper right leg rash, and denies abdominal pains, other skin rashes, and any other symptoms.   On PMHx the pt reports 1998 and 1999 back surgeries for ruptured disks with microdiscectomy, high blood pressure, denies diabetes, heart, lung or kidney problems. 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 Denies ever smoking cigarettes. Denies ETOH consumption in 30 years. Reports infrequent marijuana smoking 30 years ago.  On Family Hx the pt reports maternal cousin with brain cancer, maternal cousin with pancreatic cancer, maternal uncle with cancer. Denies much paternal knowledge.    MEDICAL HISTORY:  Past Medical History:  Diagnosis Date  . Hypertension     SURGICAL HISTORY: Past Surgical History:  Procedure Laterality Date  . [place]   ruptured lower back disc surgery. L4 & L5 Lumber disc    SOCIAL HISTORY: Social History   Socioeconomic History  . Marital status: Married    Spouse name:  Not on file  . Number of children: Not on file  . Years of education: Not on file  . Highest education level: Not on file  Occupational History  . Not on file  Social Needs  . Financial resource strain: Not on file  . Food insecurity:    Worry: Not on file    Inability: Not on file  . Transportation needs:    Medical: Not on file    Non-medical: Not on file  Tobacco Use  . Smoking status: Never Smoker  . Smokeless tobacco: Never Used  Substance and Sexual Activity  . Alcohol use: No    Comment: no alcohol for 30 years.   . Drug use: No  . Sexual activity: Not on file  Lifestyle  . Physical activity:    Days per week: Not on file    Minutes per session: Not on file  . Stress: Not on file  Relationships  . Social connections:    Talks on phone: Not on file    Gets together: Not on file    Attends religious service: Not on file    Active member of club or organization: Not on file    Attends meetings of clubs or organizations: Not on file    Relationship status: Not on file  . Intimate partner violence:    Fear of current or ex partner: Not on file    Emotionally abused: Not on file    Physically abused: Not on file    Forced sexual activity: Not on file  Other Topics Concern  . Not on file  Social History Narrative  . Not on file    FAMILY HISTORY: No family history on file.  ALLERGIES:  has No Known Allergies.  MEDICATIONS:  Current Outpatient Medications  Medication Sig Dispense Refill  . acetaminophen (TYLENOL) 325 MG tablet Take 650 mg by mouth every 6 (six) hours as needed.    . clotrimazole (LOTRIMIN AF) 1 % cream Apply 1 application topically 2 (two) times daily. To rash on rt leg 60 g 0  . diclofenac sodium (VOLTAREN) 1 % GEL Apply 4 g topically 4 (four) times daily. (Patient not taking: Reported on 01/15/2018) 5 Tube 5  . enalapril-hydrochlorothiazide (VASERETIC) 10-25 MG tablet     . fluconazole (DIFLUCAN) 100 MG tablet Take 1 tablet (100 mg total) by  mouth daily. 200mg  po on 1st day then 100mg  po daily for 10 days 11 tablet 0  . ibuprofen (ADVIL,MOTRIN) 200 MG tablet Take 200 mg by mouth every 6 (six) hours as needed.    Marland Kitchen LORazepam (ATIVAN) 0.5 MG tablet Take 1-2 tablets PO 30 min before wearing RT mask, PRN anxiety. (Patient not taking: Reported on 01/15/2018) 12 tablet 0  . sodium fluoride (FLUORISHIELD) 1.1 % GEL dental gel Instill one drop of gel per tooth space of fluoride tray. Place over teeth for 5 minutes. Remove. Spit out excess. Repeat nightly. 120 mL prn   No current facility-administered medications for this visit.     REVIEW OF SYSTEMS:    10 Point review of Systems was done is negative except as noted above.  PHYSICAL EXAMINATION: ECOG PERFORMANCE STATUS: 1 - Symptomatic but completely ambulatory  . Vitals:   01/24/18  1056  BP: 137/81  Pulse: 80  Resp: 18  Temp: 98.4 F (36.9 C)  SpO2: 99%   Filed Weights   01/24/18 1056  Weight: 278 lb 6.4 oz (126.3 kg)   .Body mass index is 35.74 kg/m.  GENERAL:alert, in no acute distress and comfortable SKIN: no significant lesions, rash on upper right leg EYES: conjunctiva are pink and non-injected, sclera anicteric OROPHARYNX: MMM, no exudates, no oropharyngeal erythema or ulceration NECK: supple, no JVD LYMPH:  no palpable lymphadenopathy in the cervical, axillary or inguinal regions LUNGS: clear to auscultation b/l with normal respiratory effort HEART: regular rate & rhythm ABDOMEN:  normoactive bowel sounds , non tender, not distended. Extremity: no pedal edema PSYCH: alert & oriented x 3 with fluent speech NEURO: no focal motor/sensory deficits  LABORATORY DATA:  I have reviewed the data as listed  . CBC Latest Ref Rng & Units 01/24/2018  WBC 4.0 - 10.3 K/uL 5.5  Hemoglobin 13.0 - 17.1 g/dL 16.4  Hematocrit 38.4 - 49.9 % 47.7  Platelets 140 - 400 K/uL 181    . CMP Latest Ref Rng & Units 01/24/2018  Glucose 70 - 140 mg/dL 162(H)  BUN 7 - 26 mg/dL 16    Creatinine 0.70 - 1.30 mg/dL 0.87  Sodium 136 - 145 mmol/L 141  Potassium 3.5 - 5.1 mmol/L 3.7  Chloride 98 - 109 mmol/L 104  CO2 22 - 29 mmol/L 27  Calcium 8.4 - 10.4 mg/dL 9.7  Total Protein 6.4 - 8.3 g/dL 7.2  Total Bilirubin 0.2 - 1.2 mg/dL 0.4  Alkaline Phos 40 - 150 U/L 74  AST 5 - 34 U/L 16  ALT 0 - 55 U/L 31    12/27/17 Surgical Pathology:    RADIOGRAPHIC STUDIES: I have personally reviewed the radiological images as listed and agreed with the findings in the report. Nm Pet Image Initial (pi) Skull Base To Thigh  Result Date: 01/04/2018 CLINICAL DATA:  Initial treatment strategy for malignant left cervical lymph node. EXAM: NUCLEAR MEDICINE PET SKULL BASE TO THIGH TECHNIQUE: 14.1 mCi F-18 FDG was injected intravenously. Full-ring PET imaging was performed from the skull base to thigh after the radiotracer. CT data was obtained and used for attenuation correction and anatomic localization. Fasting blood glucose: 80 mg/dl COMPARISON:  CT neck 12/20/2017 FINDINGS: Mediastinal blood pool activity: SUV max 2.3 NECK: Enlarged and hypermetabolic adenopathy in the left neck including levels IIa, IIb, III, V, VI, and IV. The dominant level IIa ill-defined nodal cluster measures 2.8 by 2.9 cm on image 33/4 and has a maximum SUV of 20.3 Just below this there is a photopenic cystic structure measuring 3.6 by 2.8 cm. With regard to a primary lesion, there is not a highly obvious structure. There is some faintly asymmetric activity in the left palatine tonsillar pillar, maximum SUV 5.3, contralateral side 3.8. Incidental CT findings: Small mucous retention cyst in the left maxillary sinus. CHEST: No significant abnormal hypermetabolic activity in this region. Incidental CT findings: Coronary and aortic atherosclerotic calcification. Mild calcification of the aortic valve. ABDOMEN/PELVIS: Physiologic activity in bowel. Incidental CT findings: Diffuse hepatic steatosis with fatty sparing of the  parenchymal along the gallbladder fossa. Several hypodense lesions in the lateral segment left hepatic lobe are not hypermetabolic and accordingly most likely to be benign. Periampullary duodenal diverticulum. Aortoiliac atherosclerotic vascular disease. SKELETON: No significant abnormal hypermetabolic activity in this region. Incidental CT findings: Lower lumbar spondylosis and degenerative disc disease. IMPRESSION: 1. Extensive hypermetabolic activity in the left neck,  index lesion maximum SUV 20.3. Looking for a primary lesion in the neck, there is some very faint asymmetry of the left palatine tonsillar pillar which could conceivably represent a small lesion. 2. No significant abnormal activity in the chest, abdomen/pelvis, or skeleton. 3. Other imaging findings of potential clinical significance: Aortic Atherosclerosis (ICD10-I70.0). Coronary atherosclerosis. Small mucous retention cyst in left maxillary sinus. Diffuse hepatic steatosis. Nonspecific but probably benign hypodense lesions in the lateral segment left hepatic lobe. Electronically Signed   By: Van Clines M.D.   On: 01/04/2018 16:39    ASSESSMENT & PLAN:  52 y.o. 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  -Discussed 01/04/18 PET which revealed Extensive hypermetabolic activity in the left neck, index lesion maximum SUV 20.3 -Assuming HPV driven etiology of tonsillar carcinoma without any cigarette or alcohol history -Discussed that standard treatment involves concurrent chemotherapy (Cisplatin) and radiation -Baseline audiogram results pending -Discussed that the toxicity profile for this treatment is increased and will necessitate repeat audiograms  -Stressed the importance of maintaining hydration and nutritional status -Recommend that PEG tube be  placed-patient agreeable -Continue radiation follow up with Dr Eppie Gibson -Will refer pt to chemotherapy counseling -Will order port placement as well - patient agreeable -Provided supplemental information regarding the new diagnosis of tonsillar cancer, staging, natural history, treatment consideration and potential treatment toxicities. -If labs and audiogram stable will consider Cisplatin q3weeks x 3 doses -Will continue to watch kidney numbers and electrolyte balance as well as blood counts -Will order nausea meds as needed   2. Eczematoid skin rash on his lower extremities RT>left Plan -Will refer pt to dermatologist regarding skin rash to rule out fungal infection that chemotherapy could cause to flare up -Will order anti-fungal skin cream empirically for rash and PO anti-fungal as well  -Recommend Sarna moisturizer OTC   IR Port a cath placement and G tube placement in 1 week Chemo-counseling for Cisplatin + RT in 1 week Dermatology referral for left lower rash Plan to start Cisplatin q3weeks on 02/05/2018 RTC with Dr Irene Limbo on C1D1 of Cisplatin  All of the patients questions were answered with apparent satisfaction. The patient knows to call the clinic with any problems, questions or concerns.  The toal time spent in the appt was 65 minutes and more than 50% was on counseling and direct patient cares.    Sullivan Lone MD MS AAHIVMS [facility] Hematology/Oncology Physician [facility]  (Office):       [phone] (Work cell):  [phone] (Fax):           [phone]  01/24/2018 12:41 PM  I, Baldwin Jamaica, am acting as a Education administrator for Dr Irene Limbo.   .I have reviewed the above documentation for accuracy and completeness, and I agree with the above. Brunetta Genera MD

## 2018-01-24 ENCOUNTER — Telehealth: Payer: Self-pay | Admitting: Hematology

## 2018-01-24 ENCOUNTER — Encounter: Payer: Self-pay | Admitting: Radiation Oncology

## 2018-01-24 ENCOUNTER — Ambulatory Visit
Admission: RE | Admit: 2018-01-24 | Discharge: 2018-01-24 | Disposition: A | Discharge status: Home / Self Care | Payer: PRIVATE HEALTH INSURANCE | Source: Ambulatory Visit | Attending: Hematology | Admitting: Hematology

## 2018-01-24 ENCOUNTER — Encounter: Payer: Self-pay | Admitting: *Deleted

## 2018-01-24 ENCOUNTER — Ambulatory Visit
Admission: RE | Admit: 2018-01-24 | Discharge: 2018-01-24 | Disposition: A | Discharge status: Home / Self Care | Payer: PRIVATE HEALTH INSURANCE | Source: Ambulatory Visit | Attending: Radiation Oncology | Admitting: Radiation Oncology

## 2018-01-24 ENCOUNTER — Inpatient Hospital Stay: Payer: PRIVATE HEALTH INSURANCE | Attending: Hematology | Admitting: Hematology

## 2018-01-24 VITALS — BP 137/81 | HR 80 | Temp 98.4°F | Resp 18 | Ht 74.0 in | Wt 278.4 lb

## 2018-01-24 VITALS — BP 109/59 | HR 65 | Temp 98.1°F | Resp 20 | Ht 73.0 in | Wt 277.2 lb

## 2018-01-24 DIAGNOSIS — K5900 Constipation, unspecified: Secondary | ICD-10-CM | POA: Diagnosis not present

## 2018-01-24 DIAGNOSIS — C099 Malignant neoplasm of tonsil, unspecified: Secondary | ICD-10-CM | POA: Diagnosis not present

## 2018-01-24 DIAGNOSIS — C090 Malignant neoplasm of tonsillar fossa: Secondary | ICD-10-CM

## 2018-01-24 DIAGNOSIS — Z510 Encounter for antineoplastic radiation therapy: Secondary | ICD-10-CM | POA: Insufficient documentation

## 2018-01-24 DIAGNOSIS — Z5111 Encounter for antineoplastic chemotherapy: Secondary | ICD-10-CM | POA: Diagnosis not present

## 2018-01-24 DIAGNOSIS — Z1329 Encounter for screening for other suspected endocrine disorder: Secondary | ICD-10-CM

## 2018-01-24 DIAGNOSIS — B49 Unspecified mycosis: Secondary | ICD-10-CM

## 2018-01-24 DIAGNOSIS — C799 Secondary malignant neoplasm of unspecified site: Secondary | ICD-10-CM

## 2018-01-24 DIAGNOSIS — R21 Rash and other nonspecific skin eruption: Secondary | ICD-10-CM

## 2018-01-24 DIAGNOSIS — IMO0002 Reserved for concepts with insufficient information to code with codable children: Secondary | ICD-10-CM

## 2018-01-24 LAB — MAGNESIUM: MAGNESIUM: 1.8 mg/dL (ref 1.7–2.4)

## 2018-01-24 LAB — CBC WITH DIFFERENTIAL/PLATELET
BASOS PCT: 0 %
Basophils Absolute: 0 10*3/uL (ref 0.0–0.1)
EOS ABS: 0.2 10*3/uL (ref 0.0–0.5)
Eosinophils Relative: 3 %
HEMATOCRIT: 47.7 % (ref 38.4–49.9)
Hemoglobin: 16.4 g/dL (ref 13.0–17.1)
LYMPHS ABS: 2 10*3/uL (ref 0.9–3.3)
Lymphocytes Relative: 37 %
MCH: 28.8 pg (ref 27.2–33.4)
MCHC: 34.4 g/dL (ref 32.0–36.0)
MCV: 83.8 fL (ref 79.3–98.0)
MONOS PCT: 4 %
Monocytes Absolute: 0.2 10*3/uL (ref 0.1–0.9)
Neutro Abs: 3.1 10*3/uL (ref 1.5–6.5)
Neutrophils Relative %: 56 %
Platelets: 181 10*3/uL (ref 140–400)
RBC: 5.69 MIL/uL (ref 4.20–5.82)
RDW: 13 % (ref 11.0–14.6)
WBC: 5.5 10*3/uL (ref 4.0–10.3)

## 2018-01-24 LAB — COMPREHENSIVE METABOLIC PANEL
ALBUMIN: 4.1 g/dL (ref 3.5–5.0)
ALK PHOS: 74 U/L (ref 40–150)
ALT: 31 U/L (ref 0–55)
ANION GAP: 10 (ref 3–11)
AST: 16 U/L (ref 5–34)
BILIRUBIN TOTAL: 0.4 mg/dL (ref 0.2–1.2)
BUN: 16 mg/dL (ref 7–26)
CALCIUM: 9.7 mg/dL (ref 8.4–10.4)
CO2: 27 mmol/L (ref 22–29)
Chloride: 104 mmol/L (ref 98–109)
Creatinine, Ser: 0.87 mg/dL (ref 0.70–1.30)
GFR calc non Af Amer: >60 mL/min (ref >60)
GLUCOSE: 162 mg/dL — AB (ref 70–140)
POTASSIUM: 3.7 mmol/L (ref 3.5–5.1)
Sodium: 141 mmol/L (ref 136–145)
TOTAL PROTEIN: 7.2 g/dL (ref 6.4–8.3)

## 2018-01-24 LAB — PHOSPHORUS: PHOSPHORUS: 2.7 mg/dL (ref 2.5–4.6)

## 2018-01-24 LAB — TSH: TSH: 1.419 u[IU]/mL (ref 0.320–4.118)

## 2018-01-24 MED ORDER — CLOTRIMAZOLE 1 % EX CREA: 1.0000 "application " | TOPICAL_CREAM | Freq: Two times a day (BID) | CUTANEOUS | 0 refills | Status: DC

## 2018-01-24 MED ORDER — SODIUM CHLORIDE 0.9% FLUSH
10.0000 mL | Freq: Once | INTRAVENOUS | Status: AC
  Administered 2018-01-24: 10 mL via INTRAVENOUS

## 2018-01-24 MED ORDER — FLUCONAZOLE 100 MG PO TABS: 100.0000 mg | ORAL_TABLET | Freq: Every day | ORAL | 0 refills | Status: DC

## 2018-01-24 NOTE — Progress Notes (Signed)
Head and Neck Cancer Simulation, IMRT treatment planning, and Special treatment procedure note   Outpatient  Diagnosis:    ICD-10-CM   1. Carcinoma of tonsillar fossa ([place]) C09.0     The patient was taken to the CT simulator and laid in the supine position on the table. An Aquaplast head and shoulder mask was custom fitted to the patient's anatomy. High-resolution CT axial imaging was obtained of the head and neck with contrast. I verified that the quality of the imaging is good for treatment planning. 1 Medically Necessary Treatment Device was fabricated and supervised by me: Aquaplast mask.   Treatment planning note I plan to treat the patient with IMRT. I plan to treat the patient's tumor and bilateral neck nodes. I plan to treat to a total dose of 70 Gray in 35  fractions. Dose calculation was ordered from dosimetry.  IMRT planning Note  IMRT is medically necessary and an important modality to deliver adequate dose to the patient's at risk tissues while sparing the patient's normal structures, including the: esophagus, parotid tissue, mandible, brain stem, spinal cord, oral cavity, brachial plexus.  This justifies the use of IMRT in the patient's treatment.   Special Treatment Procedure Note:  The patient will be receiving chemotherapy concurrently. Chemotherapy heightens the risk of side effects. I have considered this during the patient's treatment planning process and will monitor the patient accordingly for side effects on a weekly basis. Concurrent chemotherapy increases the complexity of this patient's treatment and therefore this constitutes a special treatment procedure.  -----------------------------------  Eppie Gibson, MD

## 2018-01-24 NOTE — Telephone Encounter (Signed)
Appointments scheduled AVS/Calendar printed per 6/12 los °

## 2018-01-25 ENCOUNTER — Telehealth: Payer: Self-pay | Admitting: *Deleted

## 2018-01-25 NOTE — Telephone Encounter (Signed)
Oncology Nurse Navigator Documentation  Received call from pt's son, Quita Skye.  He provided additional guidance re documentation to support his request for miltary transfer.  In addition, he indicated he wishes to request emergency leave through [facility] so he can be present for his dad's PEG/PAC placement now rescheduled from 7/2 to 6/21.  He later e-mailed his contact information and guidance for [employer] request.  [name], RN, BSN Head & Neck Oncology Nurse [place] at [place] [phone]

## 2018-01-25 NOTE — Telephone Encounter (Signed)
A user error has taken place: encounter opened in error, closed for administrative reasons.

## 2018-01-25 NOTE — Progress Notes (Signed)
Oncology Nurse Navigator Documentation  To provide support, encouragement and care continuity, met with Travis Mcdonald during his CT SIM. He was accompanied by his wife.   He was fitted with open-face mask, tolerated procedure without difficulty.  He noted he had taken lorazepam prior.  Hedenied questions/concerns.   I toured them to [facility] 3 treatment area, explained procedures for lobby registration, arrival to Radiation Waiting, arrival to tmt area and preparation for tmt.  They voiced understanding.   I encouraged them to call me with questions as he moves forward with pre-chemo/radiation activities and 6/24 tmt initiations.  [name], RN, BSN Head & Neck Oncology Nurse [place] at [place] [phone]

## 2018-01-25 NOTE — Telephone Encounter (Signed)
Oncology Nurse Navigator Documentation  Called Mr. Sitzmann to explain need to repeat RT planning on Monday, informed him SLP appt changed from 8:00 to 12:30, CT SIM scheduled for 2:00 after which he can continue to his 4:15 follow-up with oral surgeon.  He voiced understanding.  He expressed need to have stronger anxiolytic for repeat planning and future treatments.  He explained he had  significant difficulty with yesterday's CT SIM though he did not communicate this at the time.  He noted the 1.0 mg lorazepam he had taken provided no benefit.  I indicated I would inform Dr. Isidore Moos, assured him his anxiety can be managed.    [name], RN, BSN Head & Neck Oncology Nurse [place] at [place] [phone]

## 2018-01-25 NOTE — Telephone Encounter (Signed)
Oncology Nurse Navigator Documentation  Per pt son Rondel Baton request, called [employer] [phone]) to request emergency assistance so he can be present for 6/21 PEG and PAC placement.  Provided information to Mendel Ryder who indicated Case # [id number].  I called Adam with update and Case #.  [name], RN, BSN Head & Neck Oncology Nurse [facility] [place] at [place] [phone]

## 2018-01-25 NOTE — Progress Notes (Addendum)
Oncology Nurse Navigator Documentation  Met with Mr. Travis Mcdonald and his wife during initial consult with Dr. Irene Limbo. Provided additional PEG and PAC education, showed example of PAC.  He agreed to device placements, voiced understanding I will provide PEG education prior to procedure. They voiced understanding of Dr. Grier Mitts recommendation/explanation of HD cisplatin in conjunction with RT. He indicated had audiogram late March, will request copy of report. He provided letter from son who is SSGT in [employer] requesting medical statement from MD to support his request for humanitarian reassignment/expedited transfer d/t his dad's medical condition.  He voiced understanding I will forward to Dr. Isidore Moos.  [name], RN, BSN Head & Neck Oncology Nurse [place] at [place] [phone]

## 2018-01-25 NOTE — Telephone Encounter (Signed)
Oncology Nurse Navigator Documentation  Called pt to inform of rescheduled PEG/PAC for 6/21 0730 [facility].  He agreed to meet for PEG education 6/20 1500 prior to 1600 Chemo Education.  He understands I will provide him barium sulfate at this time.  [name], RN, BSN Head & Neck Oncology Nurse [place] at [place] [phone]

## 2018-01-26 ENCOUNTER — Telehealth: Payer: Self-pay

## 2018-01-26 NOTE — Telephone Encounter (Signed)
I spoke with Travis Mcdonald at the request of Dr. Isidore Moos. Mr. Danford voiced difficulty completing his simulation appointment this past week. He requested an increased in the antianxiety medicine he used as it did not help him. Dr. Isidore Moos recommended he take 4 tablets of 0.5 mg ativan prescribed to total 2 mg instead of 2 tablets which totaled 1 mg for his re simulation 01/29/18 and his first radiation treatment on 02/05/18. I also encouraged him to voice his concerns to the radiation therapist if he has the same experience this coming Monday during his resimulation. He voiced his understanding and knows to call me if he has any further questions or concerns.

## 2018-01-29 ENCOUNTER — Encounter: Payer: Self-pay | Admitting: Radiation Oncology

## 2018-01-29 ENCOUNTER — Ambulatory Visit
Admission: RE | Admit: 2018-01-29 | Discharge: 2018-01-29 | Disposition: A | Discharge status: Home / Self Care | Payer: PRIVATE HEALTH INSURANCE | Source: Ambulatory Visit | Attending: Radiation Oncology | Admitting: Radiation Oncology

## 2018-01-29 ENCOUNTER — Telehealth: Payer: Self-pay | Admitting: Hematology

## 2018-01-29 ENCOUNTER — Encounter: Payer: Self-pay | Admitting: *Deleted

## 2018-01-29 ENCOUNTER — Telehealth: Payer: Self-pay | Admitting: *Deleted

## 2018-01-29 ENCOUNTER — Ambulatory Visit: Payer: PRIVATE HEALTH INSURANCE | Attending: Internal Medicine

## 2018-01-29 VITALS — BP 135/92 | HR 90 | Temp 98.4°F | Resp 20 | Ht 73.0 in | Wt 278.6 lb

## 2018-01-29 DIAGNOSIS — R293 Abnormal posture: Secondary | ICD-10-CM | POA: Insufficient documentation

## 2018-01-29 DIAGNOSIS — IMO0002 Reserved for concepts with insufficient information to code with codable children: Secondary | ICD-10-CM

## 2018-01-29 DIAGNOSIS — C799 Secondary malignant neoplasm of unspecified site: Secondary | ICD-10-CM | POA: Insufficient documentation

## 2018-01-29 DIAGNOSIS — C090 Malignant neoplasm of tonsillar fossa: Secondary | ICD-10-CM

## 2018-01-29 DIAGNOSIS — Z9189 Other specified personal risk factors, not elsewhere classified: Secondary | ICD-10-CM | POA: Insufficient documentation

## 2018-01-29 DIAGNOSIS — R2689 Other abnormalities of gait and mobility: Secondary | ICD-10-CM | POA: Diagnosis present

## 2018-01-29 DIAGNOSIS — I69991 Dysphagia following unspecified cerebrovascular disease: Secondary | ICD-10-CM | POA: Insufficient documentation

## 2018-01-29 DIAGNOSIS — Z510 Encounter for antineoplastic radiation therapy: Secondary | ICD-10-CM | POA: Diagnosis not present

## 2018-01-29 DIAGNOSIS — C099 Malignant neoplasm of tonsil, unspecified: Secondary | ICD-10-CM | POA: Insufficient documentation

## 2018-01-29 MED ORDER — ONDANSETRON HCL 8 MG PO TABS: 8.0000 mg | ORAL_TABLET | Freq: Two times a day (BID) | ORAL | 1 refills | Status: DC | PRN

## 2018-01-29 MED ORDER — DEXAMETHASONE 4 MG PO TABS: ORAL_TABLET | ORAL | 1 refills | Status: DC

## 2018-01-29 MED ORDER — LIDOCAINE-PRILOCAINE 2.5-2.5 % EX CREA: TOPICAL_CREAM | CUTANEOUS | 3 refills | Status: DC

## 2018-01-29 MED ORDER — SODIUM CHLORIDE 0.9% FLUSH
10.0000 mL | Freq: Once | INTRAVENOUS | Status: AC
  Administered 2018-01-29: 10 mL via INTRAVENOUS

## 2018-01-29 MED ORDER — PROCHLORPERAZINE MALEATE 10 MG PO TABS: 10.0000 mg | ORAL_TABLET | Freq: Four times a day (QID) | ORAL | 1 refills | Status: DC | PRN

## 2018-01-29 MED FILL — FLUORISHIELD 1.1% GEL: 1.1 % | 30 days supply | Qty: 114 | Fill #0

## 2018-01-29 NOTE — Telephone Encounter (Signed)
Oncology Nurse Navigator Documentation  LVMM for Travis Mcdonald, [facility], requested delivery of Start of Care [place] Feeding Kit to [facility] front desk by Wed noon in support of patient's 6/21 PEG placement at [facility].  Requested message receipt confirmation.   [name], RN, BSN Head & Neck Oncology Nurse [place] at [place] [phone]

## 2018-01-29 NOTE — Progress Notes (Deleted)
Has armband been applied?  Yes  Does patient have an allergy to IV contrast dye?: No   Has patient ever received premedication for IV contrast dye?: No  Does patient take metformin?: No  If patient does take metformin when was the last dose: {Time; dates multiple:N/A  Date of lab work: 01/24/2018 @ 1026 BUN: 16 CR:0.87  IV site: {iv locations:Right antecuvbital  Has IV site been added to flowsheet?  Yes  BP (!) 135/92 (BP Location: Right Arm, Patient Position: Sitting, Cuff Size: Normal)   Pulse 90   Temp 98.4 F (36.9 C) (Oral)   Resp 20   Ht 6\' 1"  (1.854 m)   Wt 278 lb 9.6 oz (126.4 kg)   SpO2 97%   BMI 36.76 kg/m

## 2018-01-29 NOTE — Progress Notes (Signed)
Oncology Nurse Navigator Documentation  Met with Mr. Hodsdon prior to repeat CT SIM.  He was accompanied by his dtr. He reported taking 2 mg Lorazepam orally ca 20 minutes prior to his arrival. He managed procedure with minimal distress, noted he plans to take same dosage on days of tmt.  I encouraged him to take sublingually for faster absorption.  He voiced understanding.  [name], RN, BSN Head & Neck Oncology Nurse [place] at [place] [phone]

## 2018-01-29 NOTE — Progress Notes (Addendum)
Has armband been applied?  Yes  Does patient have an allergy to IV contrast dye?: No   Has patient ever received premedication for IV contrast dye?: No  Does patient take metformin?: No  If patient does take metformin when was the last dose: {Time; dates multiple:N/A  Date of lab work: 01/24/2018 @ 1026 BUN: 16 CR:0.87  IV site: {iv locations:Right antecubital  Has IV site been added to flowsheet?  Yes 1300Took 2 mg of Ativan po for anxiety.  BP (!) 135/92 (BP Location: Right Arm, Patient Position: Sitting, Cuff Size: Normal)   Pulse 90   Temp 98.4 F (36.9 C) (Oral)   Resp 20   Ht 6\' 1"  (1.854 m)   Wt 278 lb 9.6 oz (126.4 kg)   SpO2 97%   BMI 36.76 kg/m

## 2018-01-29 NOTE — Progress Notes (Signed)
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

## 2018-01-29 NOTE — Telephone Encounter (Signed)
Appointments scheduled patient to pick up schedule from Chemo Edu class per 6/12 los

## 2018-01-29 NOTE — Patient Instructions (Signed)
SWALLOWING EXERCISES Do these until first week in February, then 2 times per week afterwards  1. Effortful Swallows - Press your tongue against the roof of your mouth for 3 seconds, then squeeze          the muscles in your neck while you swallow your saliva or a sip of water - Repeat 20 times, 2-3 times a day, and use whenever you eat or drink  2. Masako Swallow - swallow with your tongue sticking out - Stick tongue out past your teeth and gently bite tongue with your teeth - Swallow, while holding your tongue with your teeth - Repeat 20 times, 2-3 times a day *use a wet spoon if your mouth gets dry*  3. Pitch Raise  (optional) - Repeat "he", once per second in as high of a pitch as you can - Repeat 20 times, 2-3 times a day  4. Shaker Exercise - head lift - Lie flat on your back in your bed or on a couch without pillows - Raise your head and look at your feet - KEEP YOUR SHOULDERS DOWN - HOLD FOR 45-60 SECONDS, then lower your head back down - Repeat 3 times, 2-3 times a day  5. Mendelsohn Maneuver - "half swallow" exercise - Start to swallow, and keep your Adam's apple up by squeezing hard with the            muscles of the throat - Hold the squeeze for 5-7 seconds and then relax - Repeat 20 times, 2-3 times a day *use a wet spoon if your mouth gets dry*  6. Breath Hold - Say "HUH!" loudly, then hold your breath for 3 seconds at your voice box - Repeat 20 times, 2-3 times a day  7. Chin pushback - Open your mouth  - Place your fist UNDER your chin near your neck, and push back with your fist for 5 seconds - Repeat 10 times, 2-3 times a day

## 2018-01-29 NOTE — Therapy (Signed)
[facility] [address] [place], [place], [address] Phone: [phone]   Fax:  [phone]  Speech Language Pathology Evaluation  Patient Details  Name: Travis Mcdonald MRN: [id number] Date of Birth: [birth year] Referring Provider: Eppie Gibson, MD   Encounter Date: 01/29/2018  End of Session - 01/29/18 1653    Visit Number  1    Number of Visits  7    Date for SLP Re-Evaluation  08/03/18    SLP Start Time  3299    SLP Stop Time   1315    SLP Time Calculation (min)  40 min    Activity Tolerance  Patient tolerated treatment well       Past Medical History:  Diagnosis Date  . Hypertension     Past Surgical History:  Procedure Laterality Date  . [place]   ruptured lower back disc surgery. L4 & L5 Lumber disc    There were no vitals filed for this visit.  Subjective Assessment - 01/29/18 1225    Subjective  Pt endorses an increased awareness of posterior oral awareness, but denies overt s/s aspiration.    Patient is accompained by:  Family member daughter    Currently in Pain?  No/denies         SLP Evaluation [facility] - 01/29/18 0001      SLP Visit Information   SLP Received On  01/29/18    Referring Provider  Eppie Gibson, MD    Onset Date  early 2019    Medical Diagnosis  Cancer of Tonsillar Fossa      General Information   HPI  Pt to receive concurrent chemotherapy. PEG to be placed 02-02-18. SIM attempted last week however pt with anxiety, rescheduled to today.      Prior Functional Status   Cognitive/Linguistic Baseline  Within functional limits      Cognition   Overall Cognitive Status  Within Functional Limits for tasks assessed      Auditory Comprehension   Overall Auditory Comprehension  Appears within functional limits for tasks assessed      Verbal Expression   Overall Verbal Expression  Appears within functional limits for tasks assessed      Oral Motor/Sensory  Function   Overall Oral Motor/Sensory Function  Appears within functional limits for tasks assessed    Velum  Within Functional Limits      Motor Speech   Overall Motor Speech  Appears within functional limits for tasks assessed      Pt currently tolerates regular diet with thin liquids. POs: Pt appears today without overt s/s aspiration. Thyroid elevation appeared adequate, and swallows appeared timely. Oral residue noted as WNL. Pt's swallow deemed WNL at this time.   Because data states the risk for dysphagia during and after radiation treatment is high due to undergoing radiation tx, SLP taught pt about the possibility of reduced/limited ability for PO intake during rad tx. SLP encouraged pt to continue swallowing POs as far into rad tx as possible, even ingesting POs and/or completing HEP shortly after administration of pain meds.   SLP educated pt re: changes to swallowing musculature after rad tx, and why adherence to dysphagia HEP provided today and PO consumption was necessary to inhibit muscular disuse atrophy and to reduce muscle fibrosis following rad tx. Pt demonstrated understanding of these things to SLP.    SLP then developed a HEP for pt and pt was instructed how to perform exercises involving  lingual, vocal, and pharyngeal strengthening. SLP performed each exercise and pt return demonstrated each exercise. SLP ensured pt performance was correct prior to moving on to next exercise. Pt was instructed to complete this program 2 times a day, until 6 months after his or her last rad tx (first part of February 2020), then x2 a week after that.                   SLP Education - 01/29/18 1652    Education Details  HEP procedure, late effects head/neck radiation on swallowing    Person(s) Educated  Patient    Methods  Explanation;Demonstration;Verbal cues    Comprehension  Returned demonstration;Verbalized understanding;Need further instruction;Verbal cues required        SLP Short Term Goals - 01/29/18 1654      SLP SHORT TERM GOAL #1   Title  pt will complete HEP with rare min A over two sessions    Time  2    Period  -- ST sessions    Status  New      SLP SHORT TERM GOAL #2   Title  pt will tell SLP why he is completing HEP over two sessions    Time  2    Period  -- ST sessions    Status  New      SLP SHORT TERM GOAL #3   Title  pt will tell SLP how a food journal can assist with return to pt's safest diet    Time  2    Period  -- ST visits    Status  New       SLP Long Term Goals - 01/29/18 1656      SLP LONG TERM GOAL #1   Title  pt will complete HEP with modified independence over three sessions    Time  6    Period  -- ST sessions    Status  New      SLP LONG TERM GOAL #2   Title  pt will tell SLP 3 overt s/s of aspiration PNA with modified independence    Time  6    Period  -- ST visits    Status  New      SLP LONG TERM GOAL #3   Title  pt will tell SLP when HEP frequency can be reduced to x2-3/week    Time  4    Period  -- ST visits    Status  New       Plan - 01/29/18 1653    Clinical Impression Statement  Pt with oropharyngeal swallowing essentially WNL, however the probability of swallowing difficulty increases dramatically with the initiation of chemo and radiation therapy. Pt will need to be followed by SLP for regular assessment of accurate HEP completion as well as for safety with POs both during and following treatment/s.    Speech Therapy Frequency  -- approx once every four weeks    Duration  -- 7 total visits    Treatment/Interventions  Aspiration precaution training;Diet toleration management by SLP;Pharyngeal strengthening exercises;Group dysphagia treatment;Trials of upgraded texture/liquids;Internal/external aids;Patient/family education;Compensatory strategies;SLP instruction and feedback    Potential to Achieve Goals  Good    SLP Home Exercise Plan  provided today    Consulted and Agree with Plan of  Care  Patient       Patient will benefit from skilled therapeutic intervention in order to improve the following deficits and impairments:   Dysphagia following unspecified  cerebrovascular disease    Problem List Patient Active Problem List   Diagnosis Date Noted  . Metastatic squamous cell carcinoma ([place]) 01/29/2018  . 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 Retrocalcaneal bursitis (back of heel) 09/30/2015    [facility] 01/29/2018, 5:00 PM  [facility] [address] [place], [place], [address] Phone: [phone]   Fax:  [phone]  Name: Travis Mcdonald MRN: [id number] Date of Birth: 03/13/1966

## 2018-01-30 ENCOUNTER — Telehealth: Payer: Self-pay | Admitting: *Deleted

## 2018-01-30 NOTE — Telephone Encounter (Signed)
Oncology Nurse Navigator Documentation  Spoke with Travis Mcdonald re obtaining report for audiogram taken March of this year.  He recalled conducted at [facility].  I called same, LVMM requesting report be faxed to my attention, encouraged call-back with questions.  [name], RN, BSN Head & Neck Oncology Nurse [place] at [place] [phone]

## 2018-01-30 NOTE — Telephone Encounter (Signed)
Oncology Nurse Navigator Documentation  Received call from Sharen Counter, [place], re pt son's request for emergency assistance to be present with dad during 6/21 PEG placement.  Provided clarification of procedure, upcoming 6/24 start of chemoradiation, expected course for tmt SEs.  Provided her pt's mobile # for her follow-up as tmts proceed.  [name], RN, BSN Head & Neck Oncology Nurse [place] at [place] [phone]

## 2018-01-30 NOTE — Progress Notes (Addendum)
Patient was re-simulated today due to loss of mask. No professional charges. No change in plan. -----------------------------------  Eppie Gibson, MD

## 2018-01-30 NOTE — Telephone Encounter (Signed)
Oncology Nurse Navigator Documentation  Rec'd fax from oral surgeon Dr. Raynelle Dick office indicating pt is cleared to start radiation tmts. Dr. Isidore Moos and [facility] notified.  [name], RN, BSN Head & Neck Oncology Nurse [place] at [place] [phone]

## 2018-01-31 ENCOUNTER — Telehealth: Payer: Self-pay | Admitting: *Deleted

## 2018-01-31 ENCOUNTER — Other Ambulatory Visit: Payer: Self-pay | Admitting: Radiology

## 2018-01-31 ENCOUNTER — Encounter: Payer: Self-pay | Admitting: *Deleted

## 2018-01-31 NOTE — Telephone Encounter (Signed)
Oncology Nurse Navigator Documentation  Sent medical statement with supporting documentation via e-mail to patient's son in support of his reassignment during his dad's treatment.    [name], RN, BSN Head & Neck Oncology Nurse [place] at [place] [phone]

## 2018-01-31 NOTE — Telephone Encounter (Signed)
Oncology Nurse Navigator Documentation  Received copy of patient's 11/10/2017 audiogram report from [facility], delivered to Dr. Irene Limbo.  To be scanned and available in Media.  [name], RN, BSN Head & Neck Oncology Nurse [place] at [place] [phone]

## 2018-02-01 ENCOUNTER — Other Ambulatory Visit: Payer: Self-pay | Admitting: Student

## 2018-02-01 ENCOUNTER — Inpatient Hospital Stay: Payer: PRIVATE HEALTH INSURANCE

## 2018-02-01 ENCOUNTER — Encounter: Payer: Self-pay | Admitting: *Deleted

## 2018-02-01 NOTE — Progress Notes (Signed)
Oncology Nurse Navigator Documentation  Travis Mcdonald arrived for [facility] education in anticipation of tomorrow's placement.  Using  PEG teaching device   and Teach Back, provided education for PEG use and care, including: hand hygiene, gravity bolus administration of daily water flushes and nutritional supplement, fluids and medications; care of tube insertion site including daily dressing change and cleaning; S&S of infection.  Travis Mcdonald correctly verbalized dressing change and cleaning procedures, provided correct return demonstration of gravity administration of water and dressing change.  I provided written instructions for PEG flushing/dressing change in support of verbal instruction.  Described contents of Start of Care Bolus Feeding Kit provided by [facility].    He later arrived with his wife following Chemo Education for a brief review.   They understand I will be available for ongoing PEG support.  [name], RN, BSN Head & Neck Oncology Nurse [place] at [place] [phone]

## 2018-02-02 ENCOUNTER — Encounter (HOSPITAL_COMMUNITY): Payer: Self-pay

## 2018-02-02 ENCOUNTER — Ambulatory Visit (HOSPITAL_COMMUNITY)
Admission: RE | Admit: 2018-02-02 | Discharge: 2018-02-02 | Disposition: A | Discharge status: Home / Self Care | Payer: PRIVATE HEALTH INSURANCE | Source: Ambulatory Visit | Attending: Hematology | Admitting: Hematology

## 2018-02-02 DIAGNOSIS — I10 Essential (primary) hypertension: Secondary | ICD-10-CM | POA: Diagnosis not present

## 2018-02-02 DIAGNOSIS — C099 Malignant neoplasm of tonsil, unspecified: Secondary | ICD-10-CM

## 2018-02-02 DIAGNOSIS — C760 Malignant neoplasm of head, face and neck: Secondary | ICD-10-CM | POA: Insufficient documentation

## 2018-02-02 DIAGNOSIS — Z510 Encounter for antineoplastic radiation therapy: Secondary | ICD-10-CM | POA: Diagnosis not present

## 2018-02-02 HISTORY — PX: IR GASTROSTOMY TUBE MOD SED: IMG625

## 2018-02-02 HISTORY — PX: IR IMAGING GUIDED PORT INSERTION: IMG5740

## 2018-02-02 LAB — BASIC METABOLIC PANEL
ANION GAP: 6 (ref 5–15)
BUN: 17 mg/dL (ref 6–20)
CO2: 26 mmol/L (ref 22–32)
Calcium: 9 mg/dL (ref 8.9–10.3)
Chloride: 108 mmol/L (ref 101–111)
Creatinine, Ser: 0.91 mg/dL (ref 0.61–1.24)
GFR calc Af Amer: >60 mL/min (ref >60)
GFR calc non Af Amer: >60 mL/min (ref >60)
GLUCOSE: 117 mg/dL — AB (ref 65–99)
POTASSIUM: 4.1 mmol/L (ref 3.5–5.1)
Sodium: 140 mmol/L (ref 135–145)

## 2018-02-02 LAB — CBC
HEMATOCRIT: 47.3 % (ref 39.0–52.0)
HEMOGLOBIN: 15.9 g/dL (ref 13.0–17.0)
MCH: 28.2 pg (ref 26.0–34.0)
MCHC: 33.6 g/dL (ref 30.0–36.0)
MCV: 84 fL (ref 78.0–100.0)
Platelets: 184 10*3/uL (ref 150–400)
RBC: 5.63 MIL/uL (ref 4.22–5.81)
RDW: 12.4 % (ref 11.5–15.5)
WBC: 7.4 10*3/uL (ref 4.0–10.5)

## 2018-02-02 LAB — PROTIME-INR
INR: 1.01
Prothrombin Time: 13.2 seconds (ref 11.4–15.2)

## 2018-02-02 MED ORDER — HEPARIN SOD (PORK) LOCK FLUSH 100 UNIT/ML IV SOLN
INTRAVENOUS | Status: AC
  Administered 2018-02-02: 500 [IU]
  Filled 2018-02-02: qty 5

## 2018-02-02 MED ORDER — MIDAZOLAM HCL 2 MG/2ML IJ SOLN
INTRAMUSCULAR | Status: AC
  Filled 2018-02-02: qty 4

## 2018-02-02 MED ORDER — GLUCAGON HCL RDNA (DIAGNOSTIC) 1 MG IJ SOLR
INTRAMUSCULAR | Status: AC
  Filled 2018-02-02: qty 1

## 2018-02-02 MED ORDER — MIDAZOLAM HCL 2 MG/2ML IJ SOLN
INTRAMUSCULAR | Status: AC
  Filled 2018-02-02: qty 2

## 2018-02-02 MED ORDER — IOPAMIDOL (ISOVUE-300) INJECTION 61%
INTRAVENOUS | Status: AC
  Administered 2018-02-02: 25 mL
  Filled 2018-02-02: qty 50

## 2018-02-02 MED ORDER — LIDOCAINE HCL (PF) 1 % IJ SOLN
INTRAMUSCULAR | Status: AC | PRN
  Administered 2018-02-02: 10 mL
  Administered 2018-02-02: 30 mL

## 2018-02-02 MED ORDER — FENTANYL CITRATE (PF) 100 MCG/2ML IJ SOLN
INTRAMUSCULAR | Status: AC | PRN
  Administered 2018-02-02 (×2): 50 ug via INTRAVENOUS
  Administered 2018-02-02 (×2): 25 ug via INTRAVENOUS

## 2018-02-02 MED ORDER — ONDANSETRON HCL 4 MG/2ML IJ SOLN: 4.0000 mg | INTRAMUSCULAR | Status: DC | PRN

## 2018-02-02 MED ORDER — GLUCAGON HCL (RDNA) 1 MG IJ SOLR
INTRAMUSCULAR | Status: AC | PRN
  Administered 2018-02-02: 1 mg via INTRAVENOUS

## 2018-02-02 MED ORDER — HYDROMORPHONE HCL 1 MG/ML IJ SOLN
INTRAMUSCULAR | Status: AC
  Filled 2018-02-02: qty 1

## 2018-02-02 MED ORDER — CEFAZOLIN SODIUM-DEXTROSE 2-4 GM/100ML-% IV SOLN
2.0000 g | INTRAVENOUS | Status: AC
  Administered 2018-02-02: 2 g via INTRAVENOUS

## 2018-02-02 MED ORDER — HYDROMORPHONE HCL 1 MG/ML IJ SOLN
1.0000 mg | INTRAMUSCULAR | Status: DC | PRN
  Administered 2018-02-02: 1 mg via INTRAVENOUS

## 2018-02-02 MED ORDER — HYDROCODONE-ACETAMINOPHEN 5-325 MG PO TABS: 1.0000 | ORAL_TABLET | ORAL | Status: DC | PRN

## 2018-02-02 MED ORDER — LIDOCAINE-EPINEPHRINE (PF) 1 %-1:200000 IJ SOLN
INTRAMUSCULAR | Status: AC
  Filled 2018-02-02: qty 30

## 2018-02-02 MED ORDER — FENTANYL CITRATE (PF) 100 MCG/2ML IJ SOLN
INTRAMUSCULAR | Status: AC
  Filled 2018-02-02: qty 4

## 2018-02-02 MED ORDER — CEFAZOLIN SODIUM-DEXTROSE 2-4 GM/100ML-% IV SOLN
INTRAVENOUS | Status: AC
  Filled 2018-02-02: qty 100

## 2018-02-02 MED ORDER — MIDAZOLAM HCL 2 MG/2ML IJ SOLN
INTRAMUSCULAR | Status: AC | PRN
  Administered 2018-02-02 (×2): 0.5 mg via INTRAVENOUS
  Administered 2018-02-02: 1 mg via INTRAVENOUS
  Administered 2018-02-02: 0.5 mg via INTRAVENOUS
  Administered 2018-02-02: 1 mg via INTRAVENOUS

## 2018-02-02 MED ORDER — FENTANYL CITRATE (PF) 100 MCG/2ML IJ SOLN
INTRAMUSCULAR | Status: AC
  Filled 2018-02-02: qty 2

## 2018-02-02 MED ORDER — SODIUM CHLORIDE 0.9 % IV SOLN: INTRAVENOUS | Status: DC

## 2018-02-02 NOTE — Sedation Documentation (Signed)
Port-a-cath complete. Starting G- tube.

## 2018-02-02 NOTE — Procedures (Signed)
Pre Procedure Dx: Head & Neck Cancer Post Procedural Dx: Same  Successful placement of right IJ approach port-a-cath with tip at the superior caval atrial junction. The catheter is ready for immediate use.  Successful fluoroscopic guided insertion of gastrostomy tube.   The gastrostomy tube may be used immediately for medications.   Tube feeds may be initiated in 24 hours as per the primary team.    EBL: Minimal  Complications: None immediate  Ronny Bacon, MD Pager #: [phone]

## 2018-02-02 NOTE — Progress Notes (Signed)
HEMATOLOGY/ONCOLOGY CLINIC NOTE  Date of Service: 02/05/2018  Patient Care Team: Celene Squibb, MD as PCP - General (Internal Medicine) Rozetta Nunnery, MD as Consulting Physician (Otolaryngology) Eppie Gibson, MD as Attending Physician (Radiation Oncology) Brunetta Genera, MD as Consulting Physician (Hematology) Leota Sauers, RN as Oncology Nurse Navigator Karie Mainland, RD as Dietitian (Nutrition) Valentino Saxon [place], CCC-SLP as Speech Language Pathologist (Speech Pathology) Wynelle Beckmann, Melodie Bouillon, PT as Physical Therapist (Physical Therapy) Kennith Center, LCSW as Social Worker  CHIEF COMPLAINTS/PURPOSE OF CONSULTATION:  Tonsillar carcinoma   HISTORY OF PRESENTING ILLNESS:   Travis Mcdonald is a wonderful 52 y.o. 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 He is accompanied today by his wife. The pt reports that he is doing well overall.   The pt reports first noticing an enlargement of his left neck around January 2019. He believed these to be sinus related since he took sinus medications which decreased the swelling. He notes aggravating pain but denies significant pain or problems swallowing. He reports left ear and jaw pain as well.   Four teeth extraction on 01/19/18 with radiation planning this afternoon and tentative radiation beginning 02/05/18.   He denies any ear infection concerns after his audiogram. He denies recurrent ear infections but notes that he had two ear infections last year.    The pt notes that he will try to continue work each day.  He adds that he has a skin rash on his right upper leg which is recurrent in the summers. He denies seeing a dermatologist in the past and treats the associated itching with cortisone and benadryl.   He notes that he does snore at night and his wife notes that he does not snore when he sleeps on his side.   Of note prior to the patient's visit today,  pt has had PET/CT completed on 01/04/18 with results revealing Extensive hypermetabolic activity in the left neck, index lesion maximum SUV 20.3. Looking for a primary lesion in the neck, there is some very faint asymmetry of the left palatine tonsillar pillar which could conceivably represent a small lesion. 2. No significant abnormal activity in the chest, abdomen/pelvis, or skeleton. 3. Other imaging findings of potential clinical significance: Aortic Atherosclerosis (ICD10-I70.0). Coronary atherosclerosis. Small mucous retention cyst in left maxillary sinus. Diffuse hepatic steatosis. Nonspecific but probably benign hypodense lesions in the lateral segment left hepatic lobe.   Most recent lab results (01/24/18) of CBC w/diff, and CMP  is as follows: all values are WNL except glucose at 162. Magnesium 01/24/18 is WNL at 1.8 Phosphorus 01/24/18 is WNL at 2.7  On review of systems, pt reports left ear pain, left neck discomfort, left jaw pain, upper right leg rash, and denies abdominal pains, other skin rashes, and any other symptoms.   On PMHx the pt reports 1998 and 1999 back surgeries for ruptured disks with microdiscectomy, high blood pressure, denies diabetes, heart, lung or kidney problems. 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 Denies ever smoking cigarettes. Denies ETOH consumption in 30 years. Reports infrequent marijuana smoking 30 years ago.  On Family Hx the pt reports maternal cousin with brain cancer, maternal cousin with pancreatic cancer, maternal uncle with cancer. Denies much paternal knowledge.   Interval History:   Travis Mcdonald returns today regarding his tonsillar carcinoma. The patient's last visit with [place] was on 01/24/18.  He is accompanied today by his wife and our nurse navigator [name]. The pt reports that he is doing well overall.   The pt reports that he has had some pain related to his PEG tube placement. He prefers not to take  pain medication for this and believes that his pain has resolved quite a bit.   He hasn't had a bowel movement in 3 days after taking pain medication for his PEG tube placement. He has used 3 stool softeners and magnesium citrate two nights ago, and has not been able to have a bowel movement.  He notes that in March, he did have tinnitus symptoms at the time of his 11/10/17 audiogram. He denies having problems hearing people speak at this time but his wife adds that he needs to listen to the television at a high volume.   The pt began taking Fluconazole for his bilateral skin rash which has cleared up his rash. He has not used topical anti-fungal medication yet.   Lab results today (02/05/18) of CBC is as follows: all values are WNL. CMP 02/05/18 reviewed  Magnesium 02/05/18 is wnl Phosphorous 02/05/18 is wnl  On review of systems, pt reports resolving pain related to PEG tube, constipation, and denies leg swelling, jaw/mouth/neck pain, and any other symptoms.   MEDICAL HISTORY:  Past Medical History:  Diagnosis Date  . Hypertension     SURGICAL HISTORY: Past Surgical History:  Procedure Laterality Date  . Harvey   ruptured lower back disc surgery. L4 & L5 Lumber disc  . IR GASTROSTOMY TUBE MOD SED  02/02/2018  . IR IMAGING GUIDED PORT INSERTION  02/02/2018    SOCIAL HISTORY: Social History   Socioeconomic History  . Marital status: Married    Spouse name: Not on file  . Number of children: Not on file  . Years of education: Not on file  . Highest education level: Not on file  Occupational History  . Not on file  Social Needs  . Financial resource strain: Not on file  . Food insecurity:    Worry: Not on file    Inability: Not on file  . Transportation needs:    Medical: Not on file    Non-medical: Not on file  Tobacco Use  . Smoking status: Never Smoker  . Smokeless tobacco: Never Used  Substance and Sexual Activity  . Alcohol use: No    Comment: no  alcohol for 30 years.   . Drug use: No  . Sexual activity: Not on file  Lifestyle  . Physical activity:    Days per week: Not on file    Minutes per session: Not on file  . Stress: Not on file  Relationships  . Social connections:    Talks on phone: Not on file    Gets together: Not on file    Attends religious service: Not on file    Active member of club or organization: Not on file    Attends meetings of clubs or organizations: Not on file    Relationship status: Not on file  . Intimate partner violence:    Fear of current or ex partner: Not on file    Emotionally abused: Not on file    Physically abused: Not on file    Forced sexual activity: Not on file  Other Topics Concern  . Not on file  Social History Narrative  . Not on file    FAMILY HISTORY: History reviewed. No pertinent  family history.  ALLERGIES:  has No Known Allergies.  MEDICATIONS:  Current Outpatient Medications  Medication Sig Dispense Refill  . clotrimazole (LOTRIMIN AF) 1 % cream Apply 1 application topically 2 (two) times daily. To rash on rt leg 60 g 0  . dexamethasone (DECADRON) 4 MG tablet Take 2 tablets by mouth once a day on the day after chemotherapy and then take 2 tablets two times a day for 2 days. Take with food. 30 tablet 1  . enalapril-hydrochlorothiazide (VASERETIC) 10-25 MG tablet Take 1 tablet by mouth daily.     Marland Kitchen ibuprofen (ADVIL,MOTRIN) 200 MG tablet Take 200 mg by mouth every 6 (six) hours as needed.    . lidocaine-prilocaine (EMLA) cream Apply to affected area once 30 g 3  . LORazepam (ATIVAN) 0.5 MG tablet Take 1-2 tablets PO 30 min before wearing RT mask, PRN anxiety. (Patient taking differently: Take 0.5-1 mg by mouth See admin instructions. Take 1-2 tablets PO 30 min before wearing RT mask, PRN anxiety.) 12 tablet 0  . ondansetron (ZOFRAN) 8 MG tablet Take 1 tablet (8 mg total) by mouth 2 (two) times daily as needed. Start on the third day after chemotherapy. 30 tablet 1  .  prochlorperazine (COMPAZINE) 10 MG tablet Take 1 tablet (10 mg total) by mouth every 6 (six) hours as needed (Nausea or vomiting). 30 tablet 1  . sodium fluoride (FLUORISHIELD) 1.1 % GEL dental gel Instill one drop of gel per tooth space of fluoride tray. Place over teeth for 5 minutes. Remove. Spit out excess. Repeat nightly. 120 mL prn   No current facility-administered medications for this visit.     REVIEW OF SYSTEMS:    A 10+ POINT REVIEW OF SYSTEMS WAS OBTAINED including neurology, dermatology, psychiatry, cardiac, respiratory, lymph, extremities, GI, GU, Musculoskeletal, constitutional, breasts, reproductive, HEENT.  All pertinent positives are noted in the HPI.  All others are negative.   PHYSICAL EXAMINATION: ECOG PERFORMANCE STATUS: 1 - Symptomatic but completely ambulatory  . Vitals:   02/05/18 0918  BP: 108/74  Pulse: 97  Resp: 18  Temp: 98.2 F (36.8 C)  SpO2: 97%   Filed Weights   02/05/18 0918  Weight: 271 lb (122.9 kg)   .Body mass index is 34.79 kg/m.  GENERAL:alert, in no acute distress and comfortable SKIN: no significant lesions, rash on upper right leg EYES: conjunctiva are pink and non-injected, sclera anicteric OROPHARYNX: MMM, no exudates, no oropharyngeal erythema or ulceration NECK: supple, no JVD LYMPH:  no palpable lymphadenopathy in the cervical, axillary or inguinal regions LUNGS: clear to auscultation b/l with normal respiratory effort, port a cath in situ. HEART: regular rate & rhythm ABDOMEN:  normoactive bowel sounds , non tender, not distended. g tube in situ Extremity: no pedal edema PSYCH: alert & oriented x 3 with fluent speech NEURO: no focal motor/sensory deficits  LABORATORY DATA:  I have reviewed the data as listed  . CBC Latest Ref Rng & Units 02/05/2018 02/02/2018 01/24/2018  WBC 4.0 - 10.3 K/uL 7.3 7.4 5.5  Hemoglobin 13.0 - 17.1 g/dL 16.6 15.9 16.4  Hematocrit 38.4 - 49.9 % 48.3 47.3 47.7  Platelets 140 - 400 K/uL 191 184  181    . CMP Latest Ref Rng & Units 02/05/2018 02/02/2018 01/24/2018  Glucose 70 - 140 mg/dL 189(H) 117(H) 162(H)  BUN 7 - 26 mg/dL 13 17 16   Creatinine 0.70 - 1.30 mg/dL 0.92 0.91 0.87  Sodium 136 - 145 mmol/L 137 140 141  Potassium 3.5 -  5.1 mmol/L 3.4(L) 4.1 3.7  Chloride 98 - 109 mmol/L 99 108 104  CO2 22 - 29 mmol/L 26 26 27   Calcium 8.4 - 10.4 mg/dL 10.0 9.0 9.7  Total Protein 6.4 - 8.3 g/dL 7.6 - 7.2  Total Bilirubin 0.2 - 1.2 mg/dL 0.5 - 0.4  Alkaline Phos 40 - 150 U/L 80 - 74  AST 5 - 34 U/L 14 - 16  ALT 0 - 55 U/L 26 - 31   11/10/17 Comprehensive Hearing Test:    12/27/17 Surgical Pathology:    RADIOGRAPHIC STUDIES: I have personally reviewed the radiological images as listed and agreed with the findings in the report. Ir Gastrostomy Tube  Result Date: 02/02/2018 INDICATION: History of head neck cancer - please place percutaneous gastrostomy tube for enteric nutrition supplementation purposes during impending chemoradiation. EXAM: PULL TROUGH GASTROSTOMY TUBE PLACEMENT COMPARISON:  PET-CT - 01/04/2018 MEDICATIONS: Ancef 2 g IV; Antibiotics were administered within 1 hour of the procedure. Glucagon 1 mg IV CONTRAST:  25 cc Isovue 300 administered into the gastric lumen. ANESTHESIA/SEDATION: Moderate (conscious) sedation was employed during this procedure. A total of Versed 1 mg and Fentanyl 25 mcg was administered intravenously. Moderate Sedation Time: 12 minutes. The patient's level of consciousness and vital signs were monitored continuously by radiology nursing throughout the procedure under my direct supervision. FLUOROSCOPY TIME:  42 seconds (5 mGy) COMPLICATIONS: None immediate. PROCEDURE: Informed written consent was obtained from the patient following explanation of the procedure, risks, benefits and alternatives. A time out was performed prior to the initiation of the procedure. Ultrasound scanning was performed to demarcate the edge of the left lobe of the liver. Maximal  barrier sterile technique utilized including caps, mask, sterile gowns, sterile gloves, large sterile drape, hand hygiene and Betadine prep. The left upper quadrant was sterilely prepped and draped. An oral gastric catheter was inserted into the stomach under fluoroscopy. The existing nasogastric feeding tube was removed. The left costal margin and air opacified transverse colon were identified and avoided. Air was injected into the stomach for insufflation and visualization under fluoroscopy. Under sterile conditions a 17 gauge trocar needle was utilized to access the stomach percutaneously beneath the left subcostal margin after the overlying soft tissues were anesthetized with 1% Lidocaine with epinephrine. Needle position was confirmed within the stomach with aspiration of air and injection of small amount of contrast. A single T tack was deployed for gastropexy. Over an Amplatz guide wire, a 9-French sheath was inserted into the stomach. A snare device was utilized to capture the oral gastric catheter. The snare device was pulled retrograde from the stomach up the esophagus and out the oropharynx. The 20-French pull-through gastrostomy was connected to the snare device and pulled antegrade through the oropharynx down the esophagus into the stomach and then through the percutaneous tract external to the patient. The gastrostomy was assembled externally. Contrast injection confirms position in the stomach. Several spot radiographic images were obtained in various obliquities for documentation. The patient tolerated procedure well without immediate post procedural complication. FINDINGS: After successful fluoroscopic guided placement, the gastrostomy tube is appropriately positioned with internal disc against the ventral aspect of the gastric lumen. IMPRESSION: Successful fluoroscopic insertion of a 20-French pull-through gastrostomy tube. The gastrostomy may be used immediately for medication administration and  in 24 hrs for the initiation of feeds. Electronically Signed   By: Sandi Mariscal M.D.   On: 02/02/2018 16:25   Ir Imaging Guided Port Insertion  Result Date: 02/02/2018 INDICATION: History of  head neck cancer, in need of durable intravenous access for chemotherapy administration. EXAM: IMPLANTED PORT A CATH PLACEMENT WITH ULTRASOUND AND FLUOROSCOPIC GUIDANCE COMPARISON:  PET-CT - 01/04/2018; contrast-enhanced neck CT - 12/20/2017 MEDICATIONS: Ancef 2 gm IV; The antibiotic was administered within an appropriate time interval prior to skin puncture. ANESTHESIA/SEDATION: Moderate (conscious) sedation was employed during this procedure. A total of Versed 2.5 mg and Fentanyl 125 mcg was administered intravenously. Moderate Sedation Time: 29 minutes. The patient's level of consciousness and vital signs were monitored continuously by radiology nursing throughout the procedure under my direct supervision. CONTRAST:  None FLUOROSCOPY TIME:  36 seconds (5 mGy) COMPLICATIONS: None immediate. PROCEDURE: The procedure, risks, benefits, and alternatives were explained to the patient. Questions regarding the procedure were encouraged and answered. The patient understands and consents to the procedure. The right neck and chest were prepped with chlorhexidine in a sterile fashion, and a sterile drape was applied covering the operative field. Maximum barrier sterile technique with sterile gowns and gloves were used for the procedure. A timeout was performed prior to the initiation of the procedure. Local anesthesia was provided with 1% lidocaine with epinephrine. After creating a small venotomy incision, a micropuncture kit was utilized to access the internal jugular vein. Real-time ultrasound guidance was utilized for vascular access including the acquisition of a permanent ultrasound image documenting patency of the accessed vessel. The microwire was utilized to measure appropriate catheter length. A subcutaneous port pocket was  then created along the upper chest wall utilizing a combination of sharp and blunt dissection. The pocket was irrigated with sterile saline. A single lumen thin power injectable port was chosen for placement. The 8 Fr catheter was tunneled from the port pocket site to the venotomy incision. The port was placed in the pocket. The external catheter was trimmed to appropriate length. At the venotomy, an 8 Fr peel-away sheath was placed over a guidewire under fluoroscopic guidance. The catheter was then placed through the sheath and the sheath was removed. Final catheter positioning was confirmed and documented with a fluoroscopic spot radiograph. The port was accessed with a Huber needle, aspirated and flushed with heparinized saline. The venotomy site was closed with an interrupted 4-0 Vicryl suture. The port pocket incision was closed with interrupted 2-0 Vicryl suture and the skin was opposed with a running subcuticular 4-0 Vicryl suture. Dermabond and Steri-strips were applied to both incisions. Dressings were placed. The patient tolerated the procedure well without immediate post procedural complication. FINDINGS: After catheter placement, the tip lies within the superior cavoatrial junction. The catheter aspirates and flushes normally and is ready for immediate use. IMPRESSION: Successful placement of a right internal jugular approach power injectable Port-A-Cath. The catheter is ready for immediate use. Electronically Signed   By: Sandi Mariscal M.D.   On: 02/02/2018 16:23    ASSESSMENT & PLAN:   52 y.o. 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  -Discussed that one time glycerin enema would be okay to initiate bowel movements -Discussed the importance of keeping the pt regular and staying proactive about this -Discussed that Cisplatin split dose is  recommended, given the cumulative effects of Cisplatin on his baseline hearing difficulties -Continue 64 oz of water each day -Stay physically active, walking at least 20-30 minutes each day -Will move pt to weekly Cisplatin of 40m/m2  2. Eczematoid skin rash on his lower extremities RT>left. Resolved with anti fungal - fluconazole. Plan -Have referred pt to dermatologist  -completed po fluconazole with near resolution of rash -conitnue topical anti-fungals . -might need to place on prophylactic po antifungals if rash flares with chemotherapy. -Recommend Sarna moisturizer OTC. -Continue moisturizing previously fungal-affected skin areas on bilateral LE, use colloidal silver soap for anti-fungal prevention   -Changing Cisplatin to weekly dosing  -weekly labs with each treatment -f/u with BErnestene Kielin 1 week for fluid and nutritional assessment for tube feeding. -RTC with Dr KIrene Limboin 1 week for toxicity check    All of the patients questions were answered with apparent satisfaction. The patient knows to call the clinic with any problems, questions or concerns.  The toal time spent in the appt was 30 minutes and more than 50% was on counseling and direct patient cares, patient education and co-ordination of cares.    GSullivan LoneMD MS AAHIVMS SSan [facility] LPCSparrow Specialty HospitalHematology/Oncology Physician CAdventhealth Daytona Beach (Office):       [phone](Work cell):  [phone](Fax):           [phone] 02/05/2018 10:00 AM  I, SBaldwin Jamaica am acting as a sEducation administratorfor Dr KIrene Limbo   .I have reviewed the above documentation for accuracy and completeness, and I agree with the above. .Brunetta GeneraMD

## 2018-02-02 NOTE — H&P (Signed)
Chief Complaint: Patient was seen in consultation today for percutaneous gastric tube placement and Port a cath placement at the request of Brunetta Genera  Referring Physician(s): Brunetta Genera  Supervising Physician: Sandi Mariscal  Patient Status: [facility] - Out-pt  History of Present Illness: Travis Mcdonald is a 52 y.o. 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 Hypertension     Past Surgical History:  Procedure Laterality Date  . [place]   ruptured lower back disc surgery. L4 & L5 Lumber disc    Allergies: Patient has no known allergies.  Medications: Prior to Admission medications   Medication Sig Start Date End Date Taking? Authorizing Provider  clotrimazole (LOTRIMIN AF) 1 % cream Apply 1 application topically 2 (two) times daily. To rash on rt leg 01/24/18  Yes Brunetta Genera, MD  enalapril-hydrochlorothiazide (VASERETIC) 10-25 MG tablet Take 1 tablet by mouth daily.  09/25/15  Yes [provider]  ibuprofen (ADVIL,MOTRIN) 200 MG tablet Take 200 mg by mouth every 6 (six) hours as needed.   Yes [provider]  LORazepam (ATIVAN) 0.5 MG tablet Take 1-2 tablets PO 30 min before wearing RT mask, PRN anxiety. Patient taking differently: Take 0.5-1 mg by mouth See admin instructions. Take 1-2 tablets PO 30 min before wearing RT mask, PRN anxiety. 01/15/18  Yes Eppie Gibson, MD  sodium fluoride (FLUORISHIELD) 1.1 % GEL dental gel Instill one drop of gel per tooth space of fluoride tray. Place over teeth for 5 minutes. Remove. Spit out excess. Repeat nightly. 01/15/18  Yes Lenn Cal, DDS  dexamethasone (DECADRON) 4 MG tablet Take 2 tablets by mouth once a day on the day after chemotherapy and then take 2 tablets two  times a day for 2 days. Take with food. 01/29/18   Brunetta Genera, MD  lidocaine-prilocaine (EMLA) cream Apply to affected area once 01/29/18   Brunetta Genera, MD  ondansetron (ZOFRAN) 8 MG tablet Take 1 tablet (8 mg total) by mouth 2 (two) times daily as needed. Start on the third day after chemotherapy. 01/29/18   Brunetta Genera, MD  prochlorperazine (COMPAZINE) 10 MG tablet Take 1 tablet (10 mg total) by mouth every 6 (six) hours as needed (Nausea or vomiting). 01/29/18   Brunetta Genera, MD     History reviewed. No pertinent family history.  Social History   Socioeconomic History  . Marital status: Married    Spouse name: Not on file  . Number of children: Not on file  . Years of education: Not on file  . Highest education level: Not on file  Occupational History  . Not on file  Social Needs  . Financial resource strain: Not on file  . Food insecurity:    Worry: Not on file    Inability: Not on file  . Transportation needs:    Medical: Not on file    Non-medical: Not on file  Tobacco Use  . Smoking status: Never Smoker  . Smokeless tobacco: Never Used  Substance and Sexual Activity  . Alcohol use: No    Comment: no alcohol for 30 years.   . Drug use: No  . Sexual activity: Not on file  Lifestyle  . Physical activity:    Days per week: Not  on file    Minutes per session: Not on file  . Stress: Not on file  Relationships  . Social connections:    Talks on phone: Not on file    Gets together: Not on file    Attends religious service: Not on file    Active member of club or organization: Not on file    Attends meetings of clubs or organizations: Not on file    Relationship status: Not on file  Other Topics Concern  . Not on file  Social History Narrative  . Not on file    Review of Systems: A 12 point ROS discussed and pertinent positives are indicated in the HPI above.  All other systems are negative.  Review of Systems  Constitutional:  Negative for activity change, fatigue and fever.  HENT: Negative for trouble swallowing and voice change.   Respiratory: Negative for cough and shortness of breath.   Cardiovascular: Negative for chest pain.  Musculoskeletal: Negative for back pain and gait problem.  Neurological: Negative for weakness.  Psychiatric/Behavioral: Negative for behavioral problems and confusion.    Vital Signs: BP (P) 138/89   Pulse (P) 66   Temp (P) 97.9 F (36.6 C)   Resp (P) 18   Ht (P) 6\' 2"  (1.88 m)   Wt (P) 278 lb (126.1 kg)   SpO2 (P) 97%   BMI (P) 35.69 kg/m   Physical Exam  Constitutional: He is oriented to person, place, and time. He appears well-nourished.  Cardiovascular: Normal rate, regular rhythm and normal heart sounds.  Pulmonary/Chest: Effort normal.  Abdominal: Soft. Bowel sounds are normal. There is no tenderness.  Musculoskeletal: Normal range of motion.  Neurological: He is alert and oriented to person, place, and time.  Skin: Skin is warm and dry.  Psychiatric: He has a normal mood and affect. His behavior is normal. Judgment and thought content normal.  Nursing note and vitals reviewed.   Imaging: Nm Pet Image Initial (pi) Skull Base To Thigh  Addendum Date: 01/31/2018   ADDENDUM REPORT: 01/31/2018 08:51 ADDENDUM: Original report by Dr. Janeece Fitting. Addendum by Dr. Jeralyn Ruths on 01/31/2018 at 8:45 a.m.: The study was reviewed in ENT Oncology Conference this morning with regards to radiation treatment planning. Left neck lymphadenopathy involves levels II-V. No abnormal level VI nodes are identified. Electronically Signed   By: Logan Bores M.D.   On: 01/31/2018 08:51   Result Date: 01/31/2018 CLINICAL DATA:  Initial treatment strategy for malignant left cervical lymph node. EXAM: NUCLEAR MEDICINE PET SKULL BASE TO THIGH TECHNIQUE: 14.1 mCi F-18 FDG was injected intravenously. Full-ring PET imaging was performed from the skull base to thigh after the radiotracer. CT data was  obtained and used for attenuation correction and anatomic localization. Fasting blood glucose: 80 mg/dl COMPARISON:  CT neck 12/20/2017 FINDINGS: Mediastinal blood pool activity: SUV max 2.3 NECK: Enlarged and hypermetabolic adenopathy in the left neck including levels IIa, IIb, III, V, VI, and IV. The dominant level IIa ill-defined nodal cluster measures 2.8 by 2.9 cm on image 33/4 and has a maximum SUV of 20.3 Just below this there is a photopenic cystic structure measuring 3.6 by 2.8 cm. With regard to a primary lesion, there is not a highly obvious structure. There is some faintly asymmetric activity in the left palatine tonsillar pillar, maximum SUV 5.3, contralateral side 3.8. Incidental CT findings: Small mucous retention cyst in the left maxillary sinus. CHEST: No significant abnormal hypermetabolic activity in this region. Incidental CT findings: Coronary and  aortic atherosclerotic calcification. Mild calcification of the aortic valve. ABDOMEN/PELVIS: Physiologic activity in bowel. Incidental CT findings: Diffuse hepatic steatosis with fatty sparing of the parenchymal along the gallbladder fossa. Several hypodense lesions in the lateral segment left hepatic lobe are not hypermetabolic and accordingly most likely to be benign. Periampullary duodenal diverticulum. Aortoiliac atherosclerotic vascular disease. SKELETON: No significant abnormal hypermetabolic activity in this region. Incidental CT findings: Lower lumbar spondylosis and degenerative disc disease. IMPRESSION: 1. Extensive hypermetabolic activity in the left neck, index lesion maximum SUV 20.3. Looking for a primary lesion in the neck, there is some very faint asymmetry of the left palatine tonsillar pillar which could conceivably represent a small lesion. 2. No significant abnormal activity in the chest, abdomen/pelvis, or skeleton. 3. Other imaging findings of potential clinical significance: Aortic Atherosclerosis (ICD10-I70.0). Coronary  atherosclerosis. Small mucous retention cyst in left maxillary sinus. Diffuse hepatic steatosis. Nonspecific but probably benign hypodense lesions in the lateral segment left hepatic lobe. Electronically Signed: By: Van Clines M.D. On: 01/04/2018 16:39    Labs:  CBC: Recent Labs    01/24/18 1026 02/02/18 0800  WBC 5.5 7.4  HGB 16.4 15.9  HCT 47.7 47.3  PLT 181 184    COAGS: No results for input(s): INR, APTT in the last 8760 hours.  BMP: Recent Labs    01/24/18 1026 02/02/18 0800  NA 141 140  K 3.7 4.1  CL 104 108  CO2 27 26  GLUCOSE 162* 117*  BUN 16 17  CALCIUM 9.7 9.0  CREATININE 0.87 0.91  GFRNONAA >60 >60  GFRAA >60 >60    LIVER FUNCTION TESTS: Recent Labs    01/24/18 1026  BILITOT 0.4  AST 16  ALT 31  ALKPHOS 74  PROT 7.2  ALBUMIN 4.1    TUMOR MARKERS: No results for input(s): AFPTM, CEA, CA199, CHROMGRNA in the last 8760 hours.  Assessment and Plan:  New dx head/neck tonsillar cancer To start chemo Mon Scheduled for Port a cath placement and percutaneous gastric tube placement  Risks and benefits of image guided port-a-catheter placement was discussed with the patient including, but not limited to bleeding, infection, pneumothorax, or fibrin sheath development and need for additional procedures. All of the patient's questions were answered, patient is agreeable to proceed. Consent signed and in chart.  Risks and benefits discussed with the patient including, but not limited to the need for a barium enema during the procedure, bleeding, infection, peritonitis, or damage to adjacent structures. All of the patient's questions were answered, patient is agreeable to proceed. Consent signed and in chart.   Thank you for this interesting consult.  I greatly enjoyed meeting Travis Mcdonald and look forward to participating in their care.  A copy of this report was sent to the requesting provider on this date.  Electronically  Signed: Lavonia Drafts, PA-C 02/02/2018, 8:46 AM   I spent a total of  30 Minutes   in face to face in clinical consultation, greater than 50% of which was counseling/coordinating care for [facility] and G tube placement

## 2018-02-02 NOTE — Discharge Instructions (Addendum)
**Note Travis Mcdonald-Identified via Obfuscation** [employer], Adult A central line is a thin, flexible tube (catheter) that is put in your vein. It can be used to: Take blood for lab tests. Give you medicine. Give you food and nutrients.  The procedure may vary among doctors and hospitals. Follow these instructions at home: Caring for the tube Follow instructions from your doctor about: Flushing the tube with saline solution. Cleaning the tube and the area around it. Only flush with clean (sterile) supplies. The supplies should be from your doctor, a pharmacy, or another place that your doctor recommends. Before you flush the tube or clean the area around the tube: Wash your hands with soap and water. If you cannot use soap and water, use hand sanitizer. Clean the central line hub with rubbing alcohol. Caring for your skin Keep the area where the tube was put in clean and dry. Every day, and when changing the bandage, check the skin around the central line for: Redness, swelling, or pain. Fluid or blood. Warmth. Pus. A bad smell. General instructions Keep the tube clamped, unless it is being used. Keep your supplies in a clean, dry location. If you or someone else accidentally pulls on the tube, make sure: The bandage (dressing) is okay. There is no bleeding. The tube has not been pulled out. Do not use scissors or sharp objects near the tube. Do not swim or let the tube soak in a tub. Ask your doctor what activities are safe for you. Your doctor may tell you not to lift anything or move your arm too much. Take over-the-counter and prescription medicines only as told by your doctor. Change bandages as told by your doctor. Keep your bandage dry. If a bandage gets wet, have it changed right away. Keep all follow-up visits as told by your doctor. This is important. Throwing away supplies Throw away any syringes in a trash (disposal) container that is only for sharp items (sharps container). You can buy a sharps container from a  pharmacy, or you can make one by using an empty hard plastic bottle with a cover. Place any used bandages or infusion bags into a plastic bag. Throw that bag in the trash. Contact a doctor if: You have any of these where the tube was put in: Redness, swelling, or pain. Fluid or blood. A warm feeling. Pus or a bad smell. Get help right away if: You have: A fever. Chills. Trouble getting enough air (shortness of breath). Trouble breathing. Pain in your chest. Swelling in your neck, face, chest, or arm. You are coughing. You feel your heart beating fast or skipping beats. You feel dizzy or you pass out (faint). There are red lines coming from where the tube was put in. The area where the tube was put in is bleeding and the bleeding will not stop. Your tube is hard to flush. You do not get a blood return from the tube. The tube gets loose or comes out. The tube has a hole or a tear. The tube leaks. Summary A central line is a thin, flexible tube (catheter) that is put in your vein. It can be used to take blood for lab tests or to give you medicine. Follow instructions from your doctor about flushing and cleaning the tube. Keep the area where the tube was put in clean and dry. Ask your doctor what activities are safe for you. This information is not intended to replace advice given to you by your health care provider. Make sure you discuss **Note Travis Mcdonald-Identified via Obfuscation** any questions you have with your health care provider. Document Released: 07/18/2012 Document Revised: 08/18/2016 Document Reviewed: 08/18/2016 Elsevier Interactive Patient Education  2017 [place]. Gastrostomy Tube Replacement, Care After Refer to this sheet in the next few weeks. These instructions provide you with information on caring for yourself after your procedure. Your health care provider may also give you more specific instructions. Your treatment has been planned according to current medical practices, but problems sometimes occur.  Call your health care provider if you have any problems or questions after your procedure. What can I expect after the procedure? After your procedure, it is typical to have the following:  Mild abdominal pain.  A small amount of blood-tinged fluid leaking from the replacement site.  Follow these instructions at home:  You may resume your normal level of activity.  You may resume your normal feedings.  Care for your gastrostomy tube as you did before, or as directed by your health care provider. Contact a health care provider if:  You have a fever or chills.  You have redness or irritation near the insertion site.  You continue to have abdominal pain or leaking around your gastrostomy tube. Get help right away if:  You develop bleeding or significant discharge around the tube.  You have severe abdominal pain.  Your new tube does not seem to be working properly.  You are unable to get feedings into the tube.  Your tube comes out for any reason. This information is not intended to replace advice given to you by your health care provider. Make sure you discuss any questions you have with your health care provider. Document Released: 02/26/2014 Document Revised: 01/07/2016 Document Reviewed: 12/11/2013 Elsevier Interactive Patient Education  2018 [place] Insertion, Care After This sheet gives you information about how to care for yourself after your procedure. Your health care provider may also give you more specific instructions. If you have problems or questions, contact your health care provider. What can I expect after the procedure? After your procedure, it is common to have:  Discomfort at the port insertion site.  Bruising on the skin over the port. This should improve over 3-4 days.  Follow these instructions at home: [facility] care  After your port is placed, you will get a manufacturer's information card. The card has information about your port.  Keep this card with you at all times.  Take care of the port as told by your health care provider. Ask your health care provider if you or a family member can get training for taking care of the port at home. A home health care nurse may also take care of the port.  Make sure to remember what type of port you have. Incision care  Follow instructions from your health care provider about how to take care of your port insertion site. Make sure you: ? Wash your hands with soap and water before you change your bandage (dressing). If soap and water are not available, use hand sanitizer. ? Change your dressing as told by your health care provider. ? Leave stitches (sutures), skin glue, or adhesive strips in place. These skin closures may need to stay in place for 2 weeks or longer. If adhesive strip edges start to loosen and curl up, you may trim the loose edges. Do not remove adhesive strips completely unless your health care provider tells you to do that.  Check your port insertion site every day for signs of infection. Check for: ? **Note Travis Mcdonald-Identified via Obfuscation** More redness, swelling, or pain. ? More fluid or blood. ? Warmth. ? Pus or a bad smell. General instructions  Do not take baths, swim, or use a hot tub until your health care provider approves.  Do not lift anything that is heavier than 10 lb (4.5 kg) for a week, or as told by your health care provider.  Ask your health care provider when it is okay to: ? Return to work or school. ? Resume usual physical activities or sports.  Do not drive for 24 hours if you were given a medicine to help you relax (sedative).  Take over-the-counter and prescription medicines only as told by your health care provider.  Wear a medical alert bracelet in case of an emergency. This will tell any health care providers that you have a port.  Keep all follow-up visits as told by your health care provider. This is important. Contact a health care provider if:  You cannot flush your  port with saline as directed, or you cannot draw blood from the port.  You have a fever or chills.  You have more redness, swelling, or pain around your port insertion site.  You have more fluid or blood coming from your port insertion site.  Your port insertion site feels warm to the touch.  You have pus or a bad smell coming from the port insertion site. Get help right away if:  You have chest pain or shortness of breath.  You have bleeding from your port that you cannot control. Summary  Take care of the port as told by your health care provider.  Change your dressing as told by your health care provider.  Keep all follow-up visits as told by your health care provider. This information is not intended to replace advice given to you by your health care provider. Make sure you discuss any questions you have with your health care provider. Document Released: 05/22/2013 Document Revised: 06/22/2016 Document Reviewed: 06/22/2016 Elsevier Interactive Patient Education  2017 [place]. Moderate Conscious Sedation, Adult, Care After These instructions provide you with information about caring for yourself after your procedure. Your health care provider may also give you more specific instructions. Your treatment has been planned according to current medical practices, but problems sometimes occur. Call your health care provider if you have any problems or questions after your procedure. What can I expect after the procedure? After your procedure, it is common:  To feel sleepy for several hours.  To feel clumsy and have poor balance for several hours.  To have poor judgment for several hours.  To vomit if you eat too soon.  Follow these instructions at home: For at least 24 hours after the procedure:   Do not: ? Participate in activities where you could fall or become injured. ? Drive. ? Use heavy machinery. ? Drink alcohol. ? Take sleeping pills or medicines that cause  drowsiness. ? Make important decisions or sign legal documents. ? Take care of children on your own.  Rest. Eating and drinking  Follow the diet recommended by your health care provider.  If you vomit: ? Drink water, juice, or soup when you can drink without vomiting. ? Make sure you have little or no nausea before eating solid foods. General instructions  Have a responsible adult stay with you until you are awake and alert.  Take over-the-counter and prescription medicines only as told by your health care provider.  If you smoke, do not smoke without supervision.  Keep **Note Travis Mcdonald-Identified via Obfuscation** all follow-up visits as told by your health care provider. This is important. Contact a health care provider if:  You keep feeling nauseous or you keep vomiting.  You feel light-headed.  You develop a rash.  You have a fever. Get help right away if:  You have trouble breathing. This information is not intended to replace advice given to you by your health care provider. Make sure you discuss any questions you have with your health care provider. Document Released: 05/22/2013 Document Revised: 01/04/2016 Document Reviewed: 11/21/2015 Elsevier Interactive Patient Education  [employer].

## 2018-02-05 ENCOUNTER — Encounter: Payer: Self-pay | Admitting: *Deleted

## 2018-02-05 ENCOUNTER — Inpatient Hospital Stay (HOSPITAL_BASED_OUTPATIENT_CLINIC_OR_DEPARTMENT_OTHER): Payer: PRIVATE HEALTH INSURANCE | Admitting: Hematology

## 2018-02-05 ENCOUNTER — Telehealth: Payer: Self-pay

## 2018-02-05 ENCOUNTER — Inpatient Hospital Stay: Payer: PRIVATE HEALTH INSURANCE

## 2018-02-05 ENCOUNTER — Ambulatory Visit
Admission: RE | Admit: 2018-02-05 | Discharge: 2018-02-05 | Disposition: A | Discharge status: Home / Self Care | Payer: PRIVATE HEALTH INSURANCE | Source: Ambulatory Visit | Attending: Radiation Oncology | Admitting: Radiation Oncology

## 2018-02-05 ENCOUNTER — Other Ambulatory Visit: Payer: Self-pay | Admitting: Radiation Oncology

## 2018-02-05 ENCOUNTER — Encounter: Payer: Self-pay | Admitting: Hematology

## 2018-02-05 VITALS — BP 108/74 | HR 97 | Temp 98.2°F | Resp 18 | Ht 74.0 in | Wt 271.0 lb

## 2018-02-05 DIAGNOSIS — C090 Malignant neoplasm of tonsillar fossa: Secondary | ICD-10-CM

## 2018-02-05 DIAGNOSIS — Z95828 Presence of other vascular implants and grafts: Secondary | ICD-10-CM

## 2018-02-05 DIAGNOSIS — B49 Unspecified mycosis: Secondary | ICD-10-CM

## 2018-02-05 DIAGNOSIS — C099 Malignant neoplasm of tonsil, unspecified: Secondary | ICD-10-CM

## 2018-02-05 DIAGNOSIS — IMO0002 Reserved for concepts with insufficient information to code with codable children: Secondary | ICD-10-CM

## 2018-02-05 DIAGNOSIS — R21 Rash and other nonspecific skin eruption: Secondary | ICD-10-CM | POA: Diagnosis not present

## 2018-02-05 DIAGNOSIS — Z510 Encounter for antineoplastic radiation therapy: Secondary | ICD-10-CM | POA: Diagnosis not present

## 2018-02-05 DIAGNOSIS — Z5111 Encounter for antineoplastic chemotherapy: Secondary | ICD-10-CM | POA: Diagnosis not present

## 2018-02-05 DIAGNOSIS — K5900 Constipation, unspecified: Secondary | ICD-10-CM

## 2018-02-05 DIAGNOSIS — C799 Secondary malignant neoplasm of unspecified site: Secondary | ICD-10-CM

## 2018-02-05 LAB — CMP (CANCER CENTER ONLY)
ALT: 26 U/L (ref 0–55)
AST: 14 U/L (ref 5–34)
Albumin: 3.9 g/dL (ref 3.5–5.0)
Alkaline Phosphatase: 80 U/L (ref 40–150)
Anion gap: 12 — ABNORMAL HIGH (ref 3–11)
BUN: 13 mg/dL (ref 7–26)
CALCIUM: 10 mg/dL (ref 8.4–10.4)
CHLORIDE: 99 mmol/L (ref 98–109)
CO2: 26 mmol/L (ref 22–29)
CREATININE: 0.92 mg/dL (ref 0.70–1.30)
GFR, Est AFR Am: >60 mL/min (ref >60)
GFR, Estimated: >60 mL/min (ref >60)
Glucose, Bld: 189 mg/dL — ABNORMAL HIGH (ref 70–140)
Potassium: 3.4 mmol/L — ABNORMAL LOW (ref 3.5–5.1)
Sodium: 137 mmol/L (ref 136–145)
TOTAL PROTEIN: 7.6 g/dL (ref 6.4–8.3)
Total Bilirubin: 0.5 mg/dL (ref 0.2–1.2)

## 2018-02-05 LAB — CBC WITH DIFFERENTIAL/PLATELET
Basophils Absolute: 0 10*3/uL (ref 0.0–0.1)
Basophils Relative: 0 %
EOS ABS: 0.1 10*3/uL (ref 0.0–0.5)
EOS PCT: 2 %
HCT: 48.3 % (ref 38.4–49.9)
Hemoglobin: 16.6 g/dL (ref 13.0–17.1)
LYMPHS ABS: 2 10*3/uL (ref 0.9–3.3)
LYMPHS PCT: 27 %
MCH: 29 pg (ref 27.2–33.4)
MCHC: 34.4 g/dL (ref 32.0–36.0)
MCV: 84.3 fL (ref 79.3–98.0)
MONOS PCT: 6 %
Monocytes Absolute: 0.4 10*3/uL (ref 0.1–0.9)
Neutro Abs: 4.8 10*3/uL (ref 1.5–6.5)
Neutrophils Relative %: 65 %
PLATELETS: 191 10*3/uL (ref 140–400)
RBC: 5.73 MIL/uL (ref 4.20–5.82)
RDW: 13.1 % (ref 11.0–14.6)
WBC: 7.3 10*3/uL (ref 4.0–10.3)

## 2018-02-05 LAB — PHOSPHORUS: Phosphorus: 2.8 mg/dL (ref 2.5–4.6)

## 2018-02-05 LAB — MAGNESIUM: Magnesium: 1.9 mg/dL (ref 1.7–2.4)

## 2018-02-05 MED ORDER — LORAZEPAM 1 MG PO TABS: ORAL_TABLET | ORAL | 0 refills | Status: DC

## 2018-02-05 MED ORDER — PALONOSETRON HCL INJECTION 0.25 MG/5ML
0.2500 mg | Freq: Once | INTRAVENOUS | Status: AC
  Administered 2018-02-05: 0.25 mg via INTRAVENOUS

## 2018-02-05 MED ORDER — DEXAMETHASONE SODIUM PHOSPHATE 10 MG/ML IJ SOLN
INTRAMUSCULAR | Status: AC
  Filled 2018-02-05: qty 1

## 2018-02-05 MED ORDER — PALONOSETRON HCL INJECTION 0.25 MG/5ML
INTRAVENOUS | Status: AC
  Filled 2018-02-05: qty 5

## 2018-02-05 MED ORDER — SODIUM CHLORIDE 0.9 % IV SOLN
Freq: Once | INTRAVENOUS | Status: AC
  Administered 2018-02-05: 13:00:00 via INTRAVENOUS
  Filled 2018-02-05: qty 5

## 2018-02-05 MED ORDER — SODIUM CHLORIDE 0.9 % IV SOLN
39.5000 mg/m2 | Freq: Once | INTRAVENOUS | Status: AC
  Administered 2018-02-05: 100 mg via INTRAVENOUS
  Filled 2018-02-05: qty 100

## 2018-02-05 MED ORDER — SODIUM CHLORIDE 0.9% FLUSH
10.0000 mL | INTRAVENOUS | Status: DC | PRN
  Administered 2018-02-05: 10 mL via INTRAVENOUS
  Filled 2018-02-05: qty 10

## 2018-02-05 MED ORDER — POTASSIUM CHLORIDE 2 MEQ/ML IV SOLN
Freq: Once | INTRAVENOUS | Status: AC
  Administered 2018-02-05: 12:00:00 via INTRAVENOUS
  Filled 2018-02-05: qty 10

## 2018-02-05 MED ORDER — SODIUM CHLORIDE 0.9% FLUSH
10.0000 mL | INTRAVENOUS | Status: DC | PRN
  Administered 2018-02-05: 10 mL
  Filled 2018-02-05: qty 10

## 2018-02-05 MED ORDER — HEPARIN SOD (PORK) LOCK FLUSH 100 UNIT/ML IV SOLN
500.0000 [IU] | Freq: Once | INTRAVENOUS | Status: AC | PRN
  Administered 2018-02-05: 500 [IU]
  Filled 2018-02-05: qty 5

## 2018-02-05 MED ORDER — SONAFINE EX EMUL
1.0000 "application " | Freq: Once | CUTANEOUS | Status: AC
  Administered 2018-02-05: 1 via TOPICAL

## 2018-02-05 NOTE — Progress Notes (Signed)
MD reviewed audiology report from March 2019. Will change Cisplatin to weekly due to the results.  Hardie Pulley, PharmD, BCPS, BCOP

## 2018-02-05 NOTE — Patient Instructions (Signed)
[facility] Discharge Instructions for Patients Receiving Chemotherapy  Today you received the following chemotherapy agents Cisplatin  To help prevent nausea and vomiting after your treatment, we encourage you to take your nausea medication as prescribed.   If you develop nausea and vomiting that is not controlled by your nausea medication, call the clinic.   BELOW ARE SYMPTOMS THAT SHOULD BE REPORTED IMMEDIATELY:  *FEVER GREATER THAN 100.5 F  *CHILLS WITH OR WITHOUT FEVER  NAUSEA AND VOMITING THAT IS NOT CONTROLLED WITH YOUR NAUSEA MEDICATION  *UNUSUAL SHORTNESS OF BREATH  *UNUSUAL BRUISING OR BLEEDING  TENDERNESS IN MOUTH AND THROAT WITH OR WITHOUT PRESENCE OF ULCERS  *URINARY PROBLEMS  *BOWEL PROBLEMS  UNUSUAL RASH Items with * indicate a potential emergency and should be followed up as soon as possible.  Feel free to call the clinic should you have any questions or concerns. The clinic phone number is [phone].  Please show the CHEMO ALERT CARD at check-in to the Emergency Department and triage nurse.  Cisplatin injection What is this medicine? CISPLATIN (SIS pla tin) is a chemotherapy drug. It targets fast dividing cells, like cancer cells, and causes these cells to die. This medicine is used to treat many types of cancer like bladder, ovarian, and testicular cancers. This medicine may be used for other purposes; ask your health care provider or pharmacist if you have questions. COMMON BRAND NAME(S): Platinol, Platinol -AQ What should I tell my health care provider before I take this medicine? They need to know if you have any of these conditions: -blood disorders -hearing problems -kidney disease -recent or ongoing radiation therapy -an unusual or allergic reaction to cisplatin, carboplatin, other chemotherapy, other medicines, foods, dyes, or preservatives -pregnant or trying to get pregnant -breast-feeding How should I use this  medicine? This drug is given as an infusion into a vein. It is administered in a hospital or clinic by a specially trained health care professional. Talk to your pediatrician regarding the use of this medicine in children. Special care may be needed. Overdosage: If you think you have taken too much of this medicine contact a poison control center or emergency room at once. NOTE: This medicine is only for you. Do not share this medicine with others. What if I miss a dose? It is important not to miss a dose. Call your doctor or health care professional if you are unable to keep an appointment. What may interact with this medicine? -dofetilide -foscarnet -medicines for seizures -medicines to increase blood counts like filgrastim, pegfilgrastim, sargramostim -probenecid -pyridoxine used with altretamine -rituximab -some antibiotics like amikacin, gentamicin, neomycin, polymyxin B, streptomycin, tobramycin -sulfinpyrazone -vaccines -zalcitabine Talk to your doctor or health care professional before taking any of these medicines: -acetaminophen -aspirin -ibuprofen -ketoprofen -naproxen This list may not describe all possible interactions. Give your health care provider a list of all the medicines, herbs, non-prescription drugs, or dietary supplements you use. Also tell them if you smoke, drink alcohol, or use illegal drugs. Some items may interact with your medicine. What should I watch for while using this medicine? Your condition will be monitored carefully while you are receiving this medicine. You will need important blood work done while you are taking this medicine. This drug may make you feel generally unwell. This is not uncommon, as chemotherapy can affect healthy cells as well as cancer cells. Report any side effects. Continue your course of treatment even though you feel ill unless your doctor tells you to stop.   In some cases, you may be given additional medicines to help with side  effects. Follow all directions for their use. Call your doctor or health care professional for advice if you get a fever, chills or sore throat, or other symptoms of a cold or flu. Do not treat yourself. This drug decreases your body's ability to fight infections. Try to avoid being around people who are sick. This medicine may increase your risk to bruise or bleed. Call your doctor or health care professional if you notice any unusual bleeding. Be careful brushing and flossing your teeth or using a toothpick because you may get an infection or bleed more easily. If you have any dental work done, tell your dentist you are receiving this medicine. Avoid taking products that contain aspirin, acetaminophen, ibuprofen, naproxen, or ketoprofen unless instructed by your doctor. These medicines may hide a fever. Do not become pregnant while taking this medicine. Women should inform their doctor if they wish to become pregnant or think they might be pregnant. There is a potential for serious side effects to an unborn child. Talk to your health care professional or pharmacist for more information. Do not breast-feed an infant while taking this medicine. Drink fluids as directed while you are taking this medicine. This will help protect your kidneys. Call your doctor or health care professional if you get diarrhea. Do not treat yourself. What side effects may I notice from receiving this medicine? Side effects that you should report to your doctor or health care professional as soon as possible: -allergic reactions like skin rash, itching or hives, swelling of the face, lips, or tongue -signs of infection - fever or chills, cough, sore throat, pain or difficulty passing urine -signs of decreased platelets or bleeding - bruising, pinpoint red spots on the skin, black, tarry stools, nosebleeds -signs of decreased red blood cells - unusually weak or tired, fainting spells, lightheadedness -breathing  problems -changes in hearing -gout pain -low blood counts - This drug may decrease the number of white blood cells, red blood cells and platelets. You may be at increased risk for infections and bleeding. -nausea and vomiting -pain, swelling, redness or irritation at the injection site -pain, tingling, numbness in the hands or feet -problems with balance, movement -trouble passing urine or change in the amount of urine Side effects that usually do not require medical attention (report to your doctor or health care professional if they continue or are bothersome): -changes in vision -loss of appetite -metallic taste in the mouth or changes in taste This list may not describe all possible side effects. Call your doctor for medical advice about side effects. You may report side effects to FDA at 1-800-FDA-1088. Where should I keep my medicine? This drug is given in a hospital or clinic and will not be stored at home. NOTE: This sheet is a summary. It may not cover all possible information. If you have questions about this medicine, talk to your doctor, pharmacist, or health care provider.  2018 Elsevier/Gold Standard (2007-11-06 14:40:54)  

## 2018-02-05 NOTE — Progress Notes (Signed)

## 2018-02-05 NOTE — Patient Instructions (Signed)

## 2018-02-05 NOTE — Progress Notes (Signed)
Oncology Nurse Navigator Documentation  To provide support, encouragement and care continuity, met with Mr. Kimberlin and his wife during Est. Pt. appt with Dr. Irene Limbo.  He stated last Friday's PEG/PAC placement completed without difficulty.  He is changing dsg and flushing without issue.    He voiced understanding cisplatin regime beginning with today's initial cycle will be adjusted from HD q21d to LD weekly d/t to indication of hearing loss on 3/29 audiogram.  I accompanied him to [facility] for initial tmt.  I reviewed the 2-step treatment process, answered questions.   His wife observed the treatment, RTT Candace explained process.  Mrs. Eldridge expressed appreciation for the learning opportunity.  Mr. Rademaker completed treatment without difficulty, denied questions/concerns.  He had taken 2 mg SL Ativan about 40 minutes prior.  I reviewed the registration/arrival procedure for subsequent treatments.  I escorted him to Infusion for first cisplatin.  [name], RN, BSN Head & Neck Oncology Nurse [place] at [place] [phone]

## 2018-02-05 NOTE — Telephone Encounter (Signed)
Per 6/24 los to change schedule To weekly. Added patient name in book for approval for 7/1 or to be scheduled on 7/2 and patient is aware. Patient is already scheduled for NUT.on 6/25. Spoke with RN Lanelle Bal) and no other NUT appointment is needed if so Cordella Register will request it to be scheduled

## 2018-02-05 NOTE — Progress Notes (Signed)
Due to multiple appointments today - [facility] and pharmacy agreed to run the prehydration and posthydration fluids over one hour each. Nurse Melia aware this is the plan for today.

## 2018-02-05 NOTE — Progress Notes (Signed)
Oncology Nurse Navigator Documentation  Visited Mr. Beckom and his wife in Infusion.  He concerns re first cycle cisplatin, proceeding without difficulty. We reviewed, again, registration/arrival procedures for tomorrow's H&N [facility], arrival time 0800 to Radiation Waiting.  [name], RN, BSN Head & Neck Oncology Nurse [place] at [place] [phone]

## 2018-02-06 ENCOUNTER — Ambulatory Visit
Admission: RE | Admit: 2018-02-06 | Discharge: 2018-02-06 | Disposition: A | Discharge status: Home / Self Care | Payer: PRIVATE HEALTH INSURANCE | Source: Ambulatory Visit | Attending: Radiation Oncology | Admitting: Radiation Oncology

## 2018-02-06 ENCOUNTER — Ambulatory Visit: Payer: PRIVATE HEALTH INSURANCE | Admitting: Physical Therapy

## 2018-02-06 ENCOUNTER — Other Ambulatory Visit: Payer: Self-pay

## 2018-02-06 ENCOUNTER — Inpatient Hospital Stay: Payer: PRIVATE HEALTH INSURANCE | Admitting: Nutrition

## 2018-02-06 ENCOUNTER — Encounter: Payer: Self-pay | Admitting: Hematology

## 2018-02-06 VITALS — BP 127/74 | HR 95 | Temp 98.0°F | Resp 20 | Ht 74.0 in | Wt 275.2 lb

## 2018-02-06 DIAGNOSIS — C099 Malignant neoplasm of tonsil, unspecified: Secondary | ICD-10-CM

## 2018-02-06 DIAGNOSIS — Z510 Encounter for antineoplastic radiation therapy: Secondary | ICD-10-CM | POA: Diagnosis not present

## 2018-02-06 DIAGNOSIS — I69991 Dysphagia following unspecified cerebrovascular disease: Secondary | ICD-10-CM | POA: Diagnosis not present

## 2018-02-06 DIAGNOSIS — C090 Malignant neoplasm of tonsillar fossa: Secondary | ICD-10-CM

## 2018-02-06 DIAGNOSIS — Z9189 Other specified personal risk factors, not elsewhere classified: Secondary | ICD-10-CM

## 2018-02-06 DIAGNOSIS — R2689 Other abnormalities of gait and mobility: Secondary | ICD-10-CM

## 2018-02-06 DIAGNOSIS — R293 Abnormal posture: Secondary | ICD-10-CM

## 2018-02-06 NOTE — Therapy (Addendum)
[place], [place], [address] Phone: [phone]   Fax:  [phone]  Physical Therapy Evaluation  Patient Details  Name: HRISTOPHER MISSILDINE MRN: [id number] Date of Birth: 1966/07/25 Referring Provider: Dr. Eppie Gibson   Encounter Date: 02/06/2018  PT End of Session - 02/06/18 1047    Visit Number  1    Number of Visits  1    PT Start Time  0826    PT Stop Time  0858    PT Time Calculation (min)  32 min    Activity Tolerance  Patient tolerated treatment well    Behavior During Therapy  [facility] for tasks assessed/performed       Past Medical History:  Diagnosis Date  . Hypertension     Past Surgical History:  Procedure Laterality Date  . [place]   ruptured lower back disc surgery. L4 & L5 L[place] disc  . IR GASTROSTOMY TUBE MOD SED  02/02/2018  . IR IMAGING GUIDED PORT INSERTION  02/02/2018    There were no vitals filed for this visit.   Subjective Assessment - 02/06/18 1034    Subjective  "I put my hand up to scratch and noticed the lump, but I thought it was sinus trouble, and it went down when I took sinus medications." "I had this tube (PEG) put in on Friday. It's getting better, but it still hurts."    Patient is accompained by:  Family member spouse    Pertinent History  Diagnosis is left tonsil squamous cell carcinoma, p16+. Concurrent chemoradiation treatment was started 02/05/18 with RT to tumor and bilateral neck nodes. PEG, PAC placed 02/02/18. h/o back surgery 1998, 1999 at L4-5. HTN.    Patient Stated Goals  get info from all clinic providers    Currently in Pain?  Yes    Pain Score  4     Pain Location  Abdomen at PEG site    Pain Orientation  Upper;Left    Pain Descriptors / Indicators  Sore    Pain Type  Surgical pain    Pain Onset  In the past 7 days    Aggravating Factors   moving from sit to stand    Pain Relieving Factors  sitting still         [facility] PT  Assessment - 02/06/18 0001      Assessment   Medical Diagnosis  left tonsil squamous cell carcinoma    Referring Provider  Dr. Eppie Gibson    Onset Date/Surgical Date  07/29/17 approx.    Hand Dominance  Right    Prior Therapy  none      Precautions   Precautions  Other (comment)    Precaution Comments  active cancer      Restrictions   Weight Bearing Restrictions  No      Balance Screen   Has the patient fallen in the past 6 months  No    Has the patient had a decrease in activity level because of a fear of falling?   No    Is the patient reluctant to leave their home because of a fear of falling?   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 is able to stand from sitting without pushing off from his hands; he was not asked to do 30 second sit to stand today because of pain caused by this movement since his PEG tube was just placed a few days ago      Posture/Postural Control   Posture/Postural Control  Postural limitations    Postural Limitations  Forward head      ROM /  Strength   AROM / PROM / Strength  AROM      AROM   Overall AROM Comments  Shoulder AROM WFL bilat.    AROM Assessment Site  Cervical    Cervical Flexion  WFL    Cervical Extension  WFL    Cervical - Right Side Bend  25% limited, and pulls on left neck mass area    Cervical - Left Side Bend  WFL    Cervical - Right Rotation  25% limited, and pulls on left neck mass area    Cervical - Left Rotation  WFL      Palpation   Palpation comment  mass visible and palpable at left neck      Ambulation/Gait   Ambulation/Gait  Yes    Ambulation/Gait Assistance  7: Independent    Gait Comments  slight abnormality caused by discomfort of recent PEG tube placement pain        LYMPHEDEMA/ONCOLOGY QUESTIONNAIRE - 02/06/18 1056      Type   Cancer Type  left tonsil squamous cell      Treatment   Active Chemotherapy Treatment  Yes    Date  02/05/18    Active Radiation Treatment  Yes    Date  02/05/18    Body Site  bilat. neck and tumor      Lymphedema Assessments   Lymphedema Assessments  Head and Neck      Head and Neck   4 cm superior to sternal notch around neck  47.9 cm    6 cm superior to sternal notch around neck  48.9 cm    8 cm superior to sternal notch around neck  50.2 cm             Objective measurements completed on examination: See above findings.              PT Education - 02/06/18 1046    Education Details  neck ROM, posture, breathing, walking, CURE article on staying active during treatment, "Why exercise?" flyer, lymphedema and PT info    Person(s) Educated  Patient;Spouse    Methods  Explanation;Handout    Comprehension  Verbalized understanding  Head and Neck Clinic Goals - 02/06/18 1054      Patient will be able to verbalize understanding of a home exercise program for cervical range of motion, posture, and walking.    Status  Achieved      Patient will be able to verbalize understanding of proper sitting and  standing posture.    Status  Achieved      Patient will be able to verbalize understanding of lymphedema risk and availability of treatment for this condition.    Status  Achieved         Plan - 02/06/18 1047    Clinical Impression Statement  Pleasant gentleman with diagnosis of p16+ left tonsil squamous cell carcinoma. He just started concurrent chemoradiation to tumor and bilateral neck. He does have limited neck ROM due to the mass at left neck, with is visible and palpable. He does not currently exercise. 30 second sit to stand was not asked of him due to pain on sit to stand from recent PEG tube placement. He has a high risk for lymphedema.    Clinical Decision Making  Low    Rehab Potential  Excellent    PT Frequency  One time visit    PT Treatment/Interventions  Patient/family education    PT Next Visit Plan  No follow-up planned at this time. However, patient is at high risk for lymphedema and also for deconditioning, so he may benefit from therapy should these issues develop with his chemoradiation.    PT Home Exercise Plan  neck ROM, posture, walking    Consulted and Agree with Plan of Care  Patient       Patient will benefit from skilled therapeutic intervention in order to improve the following deficits and impairments:  Decreased range of motion, Decreased mobility, Decreased knowledge of precautions  Visit Diagnosis: Other abnormalities of gait and mobility - Plan: PT plan of care cert/re-cert  Abnormal posture - Plan: PT plan of care cert/re-cert  At risk for lymphedema - Plan: PT plan of care cert/re-cert  Squamous cell carcinoma of tonsil ([place]) - Plan: PT plan of care cert/re-cert     Problem List Patient Active Problem List   Diagnosis Date Noted  . Metastatic squamous cell carcinoma ([place]) 01/29/2018  . Carcinoma of tonsillar fossa ([place]) 01/15/2018  . Retrocalcaneal bursitis (back of heel) 09/30/2015    Johanny Segers 02/06/2018, 10:58  AM  [place], [place], [address] Phone: [phone]   Fax:  [phone]  Name: DAMIRE REMEDIOS MRN: [id number] Date of Birth: 09-26-1965  Serafina Royals, PT 02/06/18 10:58 AM   04/24/18:  Patient was seen briefly today at the suggestion of Garald Balding, speech pathologist, who noted possible neck lymphedema. Neck measurements were retaken and compared to his pre-treatment measurements. All measurements have reduced, though patient has lost approx. 50 lbs.  He may have some developing lymphedema, but he himself has not noticed a change.  There is palpable and visible soft fullness. We took a picture of him with his wife's camera today and I suggested he keep tabs on the area to see if he notices any increase in neck fullness; then he should request therapy follow-up if so.   Measurements today were as follows, around neck in cm.:  At 4 cm. superior to sternal notch: 44.5 At 6 cm. superior to sternal notch: 45.2 At 8 cm. superior to sternal notch: 46.5 At 10 cm. Superior: [address], [place] 04/24/18  10:26 AM

## 2018-02-06 NOTE — Progress Notes (Signed)
Called patient to introduce myself as Arboriculturist and to see if patient interested in applying for one-time $400 [employer] and [employer]. Left voicemail with my contact name and number.

## 2018-02-06 NOTE — Progress Notes (Signed)
Patient was seen during head and neck clinic.  Patient is a 52 year old male with P 16+ tonsil cancer to receive concurrent radiation and chemotherapy.  Past medical history includes hypertension.   He had a PEG placed on June 21.   Wisdom teeth were extracted on June 7.  Medications include Ativan.  Labs were reviewed.  Height: 6 feet 2 inches. Weight: 275.2 pounds. Usual body weight: 270-280 pounds. BMI: 35.33.  Estimated nutrition needs: 2400-2600 cal, 135-150 g protein, 2.7 L fluid.  Patient will be receiving low-dose cisplatin 1 time a week. Patient currently does not have nutrition impact symptoms.  Nutrition diagnosis:  Predicted suboptimal energy intake related to new diagnosis of tonsil cancer and associated treatments as evidenced by history of presence of a condition for which research shows an increased incidence of suboptimal energy intake.  Intervention: Patient was educated to consume smaller more frequent meals and snacks with adequate calories and protein for weight maintenance. Educated patient on sources of protein. Educated patient and wife on altering textures of foods as swallowing difficulty becomes an issue. Discouraged weight loss. Brief education provided on feeding tube feedings. Fact sheets were provided.  Contact information given.  Questions were answered and teach back method was used  Monitoring, evaluation, goals: Patient will tolerate adequate calories and protein to minimize weight loss throughout treatment.  Next visit: Tuesday, July 2 during infusion.  **Disclaimer: This note was dictated with voice recognition software. Similar sounding words can inadvertently be transcribed and this note may contain transcription errors which may not have been corrected upon publication of note.**

## 2018-02-07 ENCOUNTER — Ambulatory Visit
Admission: RE | Admit: 2018-02-07 | Discharge: 2018-02-07 | Disposition: A | Discharge status: Home / Self Care | Payer: PRIVATE HEALTH INSURANCE | Source: Ambulatory Visit | Attending: Radiation Oncology | Admitting: Radiation Oncology

## 2018-02-07 DIAGNOSIS — Z510 Encounter for antineoplastic radiation therapy: Secondary | ICD-10-CM | POA: Diagnosis not present

## 2018-02-08 ENCOUNTER — Encounter: Payer: Self-pay | Admitting: *Deleted

## 2018-02-08 ENCOUNTER — Ambulatory Visit
Admission: RE | Admit: 2018-02-08 | Discharge: 2018-02-08 | Disposition: A | Discharge status: Home / Self Care | Payer: PRIVATE HEALTH INSURANCE | Source: Ambulatory Visit | Attending: Radiation Oncology | Admitting: Radiation Oncology

## 2018-02-08 ENCOUNTER — Other Ambulatory Visit: Payer: Self-pay

## 2018-02-08 DIAGNOSIS — Z510 Encounter for antineoplastic radiation therapy: Secondary | ICD-10-CM | POA: Diagnosis not present

## 2018-02-08 DIAGNOSIS — C099 Malignant neoplasm of tonsil, unspecified: Secondary | ICD-10-CM

## 2018-02-08 NOTE — Progress Notes (Signed)
Oncology Nurse Navigator Documentation  Met with Mr. Carton s/p today's RT (4th of 43) to check on his well-being.  He was accompanied by his dtr. He reported:  Managing tmts without issue, continuing to take 2 mg lorazepam SL prior to tmt.  Plans to taper to 1 mg starting next week to "see how I do"..  Flushing PEG, changing dsg daily wo/ concern.  PEG post-placement abdominal discomfort resolving, he is able to sleep on side as before. I printed/reviewed schedule for next week. He agreed to call me with questions/concerns.  [name], RN, BSN Head & Neck Oncology Nurse [place] at [place] [phone]

## 2018-02-09 ENCOUNTER — Ambulatory Visit
Admission: RE | Admit: 2018-02-09 | Discharge: 2018-02-09 | Disposition: A | Discharge status: Home / Self Care | Payer: PRIVATE HEALTH INSURANCE | Source: Ambulatory Visit | Attending: Radiation Oncology | Admitting: Radiation Oncology

## 2018-02-09 ENCOUNTER — Encounter: Payer: Self-pay | Admitting: *Deleted

## 2018-02-09 DIAGNOSIS — Z510 Encounter for antineoplastic radiation therapy: Secondary | ICD-10-CM | POA: Diagnosis not present

## 2018-02-09 NOTE — Progress Notes (Signed)
Oncology Nurse Navigator Documentation  Met with Travis Mcdonald upon his arrival for H&N [place].  He was accompanied by his wife.  Provided verbal and written overview of [place], the clinicians who will be seeing him, encouraged him to ask questions during his time with them.  He was seen by Nutrition, PT, and SW.  Spoke with him at end of [facility], addressed questions.  He proceeded to RT. I encouraged him to call me with needs/concerns.  [name], RN, BSN Head & Neck Oncology [place] at [place] [phone]

## 2018-02-12 ENCOUNTER — Encounter: Payer: Self-pay | Admitting: *Deleted

## 2018-02-12 ENCOUNTER — Ambulatory Visit
Admission: RE | Admit: 2018-02-12 | Discharge: 2018-02-12 | Disposition: A | Discharge status: Home / Self Care | Payer: PRIVATE HEALTH INSURANCE | Source: Ambulatory Visit | Attending: Radiation Oncology | Admitting: Radiation Oncology

## 2018-02-12 ENCOUNTER — Encounter: Payer: Self-pay | Admitting: Hematology

## 2018-02-12 ENCOUNTER — Inpatient Hospital Stay: Payer: PRIVATE HEALTH INSURANCE

## 2018-02-12 ENCOUNTER — Other Ambulatory Visit: Payer: Self-pay | Admitting: Radiation Oncology

## 2018-02-12 ENCOUNTER — Inpatient Hospital Stay: Payer: PRIVATE HEALTH INSURANCE | Attending: Hematology | Admitting: Hematology

## 2018-02-12 VITALS — BP 112/79 | HR 92 | Temp 98.7°F | Resp 21 | Ht 74.0 in | Wt 269.3 lb

## 2018-02-12 DIAGNOSIS — Z5111 Encounter for antineoplastic chemotherapy: Secondary | ICD-10-CM | POA: Diagnosis present

## 2018-02-12 DIAGNOSIS — K5900 Constipation, unspecified: Secondary | ICD-10-CM

## 2018-02-12 DIAGNOSIS — Z510 Encounter for antineoplastic radiation therapy: Secondary | ICD-10-CM | POA: Insufficient documentation

## 2018-02-12 DIAGNOSIS — C099 Malignant neoplasm of tonsil, unspecified: Secondary | ICD-10-CM

## 2018-02-12 DIAGNOSIS — Z931 Gastrostomy status: Secondary | ICD-10-CM | POA: Diagnosis not present

## 2018-02-12 DIAGNOSIS — R21 Rash and other nonspecific skin eruption: Secondary | ICD-10-CM | POA: Insufficient documentation

## 2018-02-12 DIAGNOSIS — C090 Malignant neoplasm of tonsillar fossa: Secondary | ICD-10-CM | POA: Insufficient documentation

## 2018-02-12 DIAGNOSIS — IMO0002 Reserved for concepts with insufficient information to code with codable children: Secondary | ICD-10-CM

## 2018-02-12 DIAGNOSIS — Z7189 Other specified counseling: Secondary | ICD-10-CM

## 2018-02-12 DIAGNOSIS — Z79899 Other long term (current) drug therapy: Secondary | ICD-10-CM

## 2018-02-12 DIAGNOSIS — C799 Secondary malignant neoplasm of unspecified site: Secondary | ICD-10-CM

## 2018-02-12 DIAGNOSIS — J069 Acute upper respiratory infection, unspecified: Secondary | ICD-10-CM | POA: Diagnosis not present

## 2018-02-12 LAB — CBC WITH DIFFERENTIAL (CANCER CENTER ONLY)
BASOS ABS: 0 10*3/uL (ref 0.0–0.1)
Basophils Relative: 0 %
Eosinophils Absolute: 0.3 10*3/uL (ref 0.0–0.5)
Eosinophils Relative: 3 %
HEMATOCRIT: 49.7 % (ref 38.4–49.9)
HEMOGLOBIN: 16.6 g/dL (ref 13.0–17.1)
LYMPHS PCT: 20 %
Lymphs Abs: 1.8 10*3/uL (ref 0.9–3.3)
MCH: 28.1 pg (ref 27.2–33.4)
MCHC: 33.4 g/dL (ref 32.0–36.0)
MCV: 84.1 fL (ref 79.3–98.0)
MONO ABS: 0.7 10*3/uL (ref 0.1–0.9)
MONOS PCT: 7 %
NEUTROS ABS: 6.1 10*3/uL (ref 1.5–6.5)
Neutrophils Relative %: 70 %
Platelet Count: 250 10*3/uL (ref 140–400)
RBC: 5.91 MIL/uL — ABNORMAL HIGH (ref 4.20–5.82)
RDW: 13.1 % (ref 11.0–14.6)
WBC Count: 8.9 10*3/uL (ref 4.0–10.3)

## 2018-02-12 LAB — CMP (CANCER CENTER ONLY)
ALT: 39 U/L (ref 0–44)
ANION GAP: 8 (ref 5–15)
AST: 14 U/L — AB (ref 15–41)
Albumin: 3.7 g/dL (ref 3.5–5.0)
Alkaline Phosphatase: 88 U/L (ref 38–126)
BILIRUBIN TOTAL: 0.3 mg/dL (ref 0.3–1.2)
BUN: 15 mg/dL (ref 6–20)
CO2: 27 mmol/L (ref 22–32)
Calcium: 9.6 mg/dL (ref 8.9–10.3)
Chloride: 101 mmol/L (ref 98–111)
Creatinine: 0.95 mg/dL (ref 0.61–1.24)
GFR, Est AFR Am: >60 mL/min (ref >60)
GFR, Estimated: >60 mL/min (ref >60)
GLUCOSE: 130 mg/dL — AB (ref 70–99)
POTASSIUM: 3.8 mmol/L (ref 3.5–5.1)
Sodium: 136 mmol/L (ref 135–145)
Total Protein: 7 g/dL (ref 6.5–8.1)

## 2018-02-12 LAB — MAGNESIUM: Magnesium: 1.9 mg/dL (ref 1.7–2.4)

## 2018-02-12 LAB — PHOSPHORUS: Phosphorus: 3.5 mg/dL (ref 2.5–4.6)

## 2018-02-12 MED ORDER — LIDOCAINE VISCOUS HCL 2 % MT SOLN: OROMUCOSAL | 5 refills | Status: DC

## 2018-02-12 NOTE — Progress Notes (Signed)
HEMATOLOGY/ONCOLOGY CLINIC NOTE  Date of Service: 02/12/2018  Patient Care Team: Celene Squibb, MD as PCP - General (Internal Medicine) Rozetta Nunnery, MD as Consulting Physician (Otolaryngology) Eppie Gibson, MD as Attending Physician (Radiation Oncology) Brunetta Genera, MD as Consulting Physician (Hematology) Leota Sauers, RN as Oncology Nurse Navigator Karie Mainland, RD as Dietitian (Nutrition) Valentino Saxon [place], CCC-SLP as Speech Language Pathologist (Speech Pathology) Wynelle Beckmann, Melodie Bouillon, PT as Physical Therapist (Physical Therapy) Kennith Center, LCSW as Social Worker  CHIEF COMPLAINTS/PURPOSE OF CONSULTATION:  F/u for Tonsillar carcinoma   HISTORY OF PRESENTING ILLNESS:   Travis Mcdonald is a wonderful 51 y.o. 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 He is accompanied today by his wife. The pt reports that he is doing well overall.   The pt reports first noticing an enlargement of his left neck around January 2019. He believed these to be sinus related since he took sinus medications which decreased the swelling. He notes aggravating pain but denies significant pain or problems swallowing. He reports left ear and jaw pain as well.   Four teeth extraction on 01/19/18 with radiation planning this afternoon and tentative radiation beginning 02/05/18.   He denies any ear infection concerns after his audiogram. He denies recurrent ear infections but notes that he had two ear infections last year.    The pt notes that he will try to continue work each day.  He adds that he has a skin rash on his right upper leg which is recurrent in the summers. He denies seeing a dermatologist in the past and treats the associated itching with cortisone and benadryl.   He notes that he does snore at night and his wife notes that he does not snore when he sleeps on his side.   Of note prior to the patient's visit  today, pt has had PET/CT completed on 01/04/18 with results revealing Extensive hypermetabolic activity in the left neck, index lesion maximum SUV 20.3. Looking for a primary lesion in the neck, there is some very faint asymmetry of the left palatine tonsillar pillar which could conceivably represent a small lesion. 2. No significant abnormal activity in the chest, abdomen/pelvis, or skeleton. 3. Other imaging findings of potential clinical significance: Aortic Atherosclerosis (ICD10-I70.0). Coronary atherosclerosis. Small mucous retention cyst in left maxillary sinus. Diffuse hepatic steatosis. Nonspecific but probably benign hypodense lesions in the lateral segment left hepatic lobe.   Most recent lab results (01/24/18) of CBC w/diff, and CMP  is as follows: all values are WNL except glucose at 162. Magnesium 01/24/18 is WNL at 1.8 Phosphorus 01/24/18 is WNL at 2.7  On review of systems, pt reports left ear pain, left neck discomfort, left jaw pain, upper right leg rash, and denies abdominal pains, other skin rashes, and any other symptoms.   On PMHx the pt reports 1998 and 1999 back surgeries for ruptured disks with microdiscectomy, high blood pressure, denies diabetes, heart, lung or kidney problems. 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 Denies ever smoking cigarettes. Denies ETOH consumption in 30 years. Reports infrequent marijuana smoking 30 years ago.  On Family Hx the pt reports maternal cousin with brain cancer, maternal cousin with pancreatic cancer, maternal uncle with cancer. Denies much paternal knowledge.   Interval History:   Travis Mcdonald returns today regarding his tonsillar carcinoma. The patient's last visit with [place] was  on 01/24/18. The patient's last visit with [place] was on 02/05/18. He is accompanied today by his son. The pt reports that he is doing well overall.   The pt reports that he has been feeling tired and fatigued but denies this  interrupting his ability to function or maintain his activities of daily life . He notes that his PEG tube has been somewhat positionally painful, which he describes as a deep/achy pain. He denies any pain when he flushes the tube. He has been nauseous one time which he associates with what he ate. He is continuing to flush his PEG tube though is eating 100% PO at this time at about 80% of his normal amount of food. He is using salt/baking soda mouth washes. He denies any changes in hearing or skin discomfort.   He denies being able to walk 20-30 minutes each day.   Lab results today (02/12/18) of CBC w/diff, CMP, and Reticulocytes is as follows: all values are WNL except for RBC at 5.91, Glucose at 130, AST at 14. Magnesium 02/12/18 is WNL at 1.9 Phosphorous 02/12/18 is 3.5  On review of systems, pt reports PEG tube discomfort, fatigue, resolving leg rash, and denies skin discomfort, persistent nausea, hearing changes, discharge around his PEG tube, leg swelling, and any other symptoms.   MEDICAL HISTORY:  Past Medical History:  Diagnosis Date  . Hypertension     SURGICAL HISTORY: Past Surgical History:  Procedure Laterality Date  . [place]   ruptured lower back disc surgery. L4 & L5 Lumber disc  . IR GASTROSTOMY TUBE MOD SED  02/02/2018  . IR IMAGING GUIDED PORT INSERTION  02/02/2018    SOCIAL HISTORY: Social History   Socioeconomic History  . Marital status: Married    Spouse name: Not on file  . Number of children: Not on file  . Years of education: Not on file  . Highest education level: Not on file  Occupational History  . Not on file  Social Needs  . Financial resource strain: Not on file  . Food insecurity:    Worry: Not on file    Inability: Not on file  . Transportation needs:    Medical: Not on file    Non-medical: Not on file  Tobacco Use  . Smoking status: Never Smoker  . Smokeless tobacco: Never Used  Substance and Sexual Activity  . Alcohol use:  No    Comment: no alcohol for 30 years.   . Drug use: No  . Sexual activity: Not on file  Lifestyle  . Physical activity:    Days per week: Not on file    Minutes per session: Not on file  . Stress: Not on file  Relationships  . Social connections:    Talks on phone: Not on file    Gets together: Not on file    Attends religious service: Not on file    Active member of club or organization: Not on file    Attends meetings of clubs or organizations: Not on file    Relationship status: Not on file  . Intimate partner violence:    Fear of current or ex partner: Not on file    Emotionally abused: Not on file    Physically abused: Not on file    Forced sexual activity: Not on file  Other Topics Concern  . Not on file  Social History Narrative  . Not on file    FAMILY HISTORY: No family history  on file.  ALLERGIES:  has No Known Allergies.  MEDICATIONS:  Current Outpatient Medications  Medication Sig Dispense Refill  . clotrimazole (LOTRIMIN AF) 1 % cream Apply 1 application topically 2 (two) times daily. To rash on rt leg 60 g 0  . dexamethasone (DECADRON) 4 MG tablet Take 2 tablets by mouth once a day on the day after chemotherapy and then take 2 tablets two times a day for 2 days. Take with food. 30 tablet 1  . enalapril-hydrochlorothiazide (VASERETIC) 10-25 MG tablet Take 1 tablet by mouth daily.     Marland Kitchen ibuprofen (ADVIL,MOTRIN) 200 MG tablet Take 200 mg by mouth every 6 (six) hours as needed.    . lidocaine-prilocaine (EMLA) cream Apply to affected area once 30 g 3  . LORazepam (ATIVAN) 1 MG tablet Take 1-2 tablets sublingually 30 min before radiotherapy. 40 tablet 0  . ondansetron (ZOFRAN) 8 MG tablet Take 1 tablet (8 mg total) by mouth 2 (two) times daily as needed. Start on the third day after chemotherapy. 30 tablet 1  . prochlorperazine (COMPAZINE) 10 MG tablet Take 1 tablet (10 mg total) by mouth every 6 (six) hours as needed (Nausea or vomiting). 30 tablet 1  . sodium  fluoride (FLUORISHIELD) 1.1 % GEL dental gel Instill one drop of gel per tooth space of fluoride tray. Place over teeth for 5 minutes. Remove. Spit out excess. Repeat nightly. 120 mL prn   No current facility-administered medications for this visit.     REVIEW OF SYSTEMS:    A 10+ POINT REVIEW OF SYSTEMS WAS OBTAINED including neurology, dermatology, psychiatry, cardiac, respiratory, lymph, extremities, GI, GU, Musculoskeletal, constitutional, breasts, reproductive, HEENT.  All pertinent positives are noted in the HPI.  All others are negative.    PHYSICAL EXAMINATION: ECOG PERFORMANCE STATUS: 1 - Symptomatic but completely ambulatory  Vitals:   02/12/18 1439  BP: 112/79  Pulse: 92  Resp: (!) 21  Temp: 98.7 F (37.1 C)  SpO2: 97%   Filed Weights   02/12/18 1439  Weight: 269 lb 4.8 oz (122.2 kg)   .Body mass index is 34.58 kg/m.  GENERAL:alert, in no acute distress and comfortable SKIN: no significant lesions, rash on upper right leg EYES: conjunctiva are pink and non-injected, sclera anicteric OROPHARYNX: MMM, no exudates, no oropharyngeal erythema or ulceration NECK: supple, no JVD LYMPH:  no palpable lymphadenopathy in the cervical, axillary or inguinal regions LUNGS: clear to auscultation b/l with normal respiratory effort, port a cath in situ HEART: regular rate & rhythm ABDOMEN:  normoactive bowel sounds , non tender, not distended, g tube in situ. Extremity: no pedal edema PSYCH: alert & oriented x 3 with fluent speech NEURO: no focal motor/sensory deficits   LABORATORY DATA:  I have reviewed the data as listed  . CBC Latest Ref Rng & Units 02/12/2018 02/05/2018 02/02/2018  WBC 4.0 - 10.3 K/uL 8.9 7.3 7.4  Hemoglobin 13.0 - 17.1 g/dL 16.6 16.6 15.9  Hematocrit 38.4 - 49.9 % 49.7 48.3 47.3  Platelets 140 - 400 K/uL 250 191 184    . CMP Latest Ref Rng & Units 02/12/2018 02/05/2018 02/02/2018  Glucose 70 - 99 mg/dL 130(H) 189(H) 117(H)  BUN 6 - 20 mg/dL 15 13 17     Creatinine 0.61 - 1.24 mg/dL 0.95 0.92 0.91  Sodium 135 - 145 mmol/L 136 137 140  Potassium 3.5 - 5.1 mmol/L 3.8 3.4(L) 4.1  Chloride 98 - 111 mmol/L 101 99 108  CO2 22 - 32 mmol/L 27 26  26  Calcium 8.9 - 10.3 mg/dL 9.6 10.0 9.0  Total Protein 6.5 - 8.1 g/dL 7.0 7.6 -  Total Bilirubin 0.3 - 1.2 mg/dL 0.3 0.5 -  Alkaline Phos 38 - 126 U/L 88 80 -  AST 15 - 41 U/L 14(L) 14 -  ALT 0 - 44 U/L 39 26 -   11/10/17 Comprehensive Hearing Test:    12/27/17 Surgical Pathology:    RADIOGRAPHIC STUDIES: I have personally reviewed the radiological images as listed and agreed with the findings in the report. Ir Gastrostomy Tube  Result Date: 02/02/2018 INDICATION: History of head neck cancer - please place percutaneous gastrostomy tube for enteric nutrition supplementation purposes during impending chemoradiation. EXAM: PULL TROUGH GASTROSTOMY TUBE PLACEMENT COMPARISON:  PET-CT - 01/04/2018 MEDICATIONS: Ancef 2 g IV; Antibiotics were administered within 1 hour of the procedure. Glucagon 1 mg IV CONTRAST:  25 cc Isovue 300 administered into the gastric lumen. ANESTHESIA/SEDATION: Moderate (conscious) sedation was employed during this procedure. A total of Versed 1 mg and Fentanyl 25 mcg was administered intravenously. Moderate Sedation Time: 12 minutes. The patient's level of consciousness and vital signs were monitored continuously by radiology nursing throughout the procedure under my direct supervision. FLUOROSCOPY TIME:  42 seconds (5 mGy) COMPLICATIONS: None immediate. PROCEDURE: Informed written consent was obtained from the patient following explanation of the procedure, risks, benefits and alternatives. A time out was performed prior to the initiation of the procedure. Ultrasound scanning was performed to demarcate the edge of the left lobe of the liver. Maximal barrier sterile technique utilized including caps, mask, sterile gowns, sterile gloves, large sterile drape, hand hygiene and Betadine prep.  The left upper quadrant was sterilely prepped and draped. An oral gastric catheter was inserted into the stomach under fluoroscopy. The existing nasogastric feeding tube was removed. The left costal margin and air opacified transverse colon were identified and avoided. Air was injected into the stomach for insufflation and visualization under fluoroscopy. Under sterile conditions a 17 gauge trocar needle was utilized to access the stomach percutaneously beneath the left subcostal margin after the overlying soft tissues were anesthetized with 1% Lidocaine with epinephrine. Needle position was confirmed within the stomach with aspiration of air and injection of small amount of contrast. A single T tack was deployed for gastropexy. Over an Amplatz guide wire, a 9-French sheath was inserted into the stomach. A snare device was utilized to capture the oral gastric catheter. The snare device was pulled retrograde from the stomach up the esophagus and out the oropharynx. The 20-French pull-through gastrostomy was connected to the snare device and pulled antegrade through the oropharynx down the esophagus into the stomach and then through the percutaneous tract external to the patient. The gastrostomy was assembled externally. Contrast injection confirms position in the stomach. Several spot radiographic images were obtained in various obliquities for documentation. The patient tolerated procedure well without immediate post procedural complication. FINDINGS: After successful fluoroscopic guided placement, the gastrostomy tube is appropriately positioned with internal disc against the ventral aspect of the gastric lumen. IMPRESSION: Successful fluoroscopic insertion of a 20-French pull-through gastrostomy tube. The gastrostomy may be used immediately for medication administration and in 24 hrs for the initiation of feeds. Electronically Signed   By: Sandi Mariscal M.D.   On: 02/02/2018 16:25   Ir Imaging Guided Port  Insertion  Result Date: 02/02/2018 INDICATION: History of head neck cancer, in need of durable intravenous access for chemotherapy administration. EXAM: IMPLANTED PORT A CATH PLACEMENT WITH ULTRASOUND AND FLUOROSCOPIC  GUIDANCE COMPARISON:  PET-CT - 01/04/2018; contrast-enhanced neck CT - 12/20/2017 MEDICATIONS: Ancef 2 gm IV; The antibiotic was administered within an appropriate time interval prior to skin puncture. ANESTHESIA/SEDATION: Moderate (conscious) sedation was employed during this procedure. A total of Versed 2.5 mg and Fentanyl 125 mcg was administered intravenously. Moderate Sedation Time: 29 minutes. The patient's level of consciousness and vital signs were monitored continuously by radiology nursing throughout the procedure under my direct supervision. CONTRAST:  None FLUOROSCOPY TIME:  36 seconds (5 mGy) COMPLICATIONS: None immediate. PROCEDURE: The procedure, risks, benefits, and alternatives were explained to the patient. Questions regarding the procedure were encouraged and answered. The patient understands and consents to the procedure. The right neck and chest were prepped with chlorhexidine in a sterile fashion, and a sterile drape was applied covering the operative field. Maximum barrier sterile technique with sterile gowns and gloves were used for the procedure. A timeout was performed prior to the initiation of the procedure. Local anesthesia was provided with 1% lidocaine with epinephrine. After creating a small venotomy incision, a micropuncture kit was utilized to access the internal jugular vein. Real-time ultrasound guidance was utilized for vascular access including the acquisition of a permanent ultrasound image documenting patency of the accessed vessel. The microwire was utilized to measure appropriate catheter length. A subcutaneous port pocket was then created along the upper chest wall utilizing a combination of sharp and blunt dissection. The pocket was irrigated with sterile  saline. A single lumen thin power injectable port was chosen for placement. The 8 Fr catheter was tunneled from the port pocket site to the venotomy incision. The port was placed in the pocket. The external catheter was trimmed to appropriate length. At the venotomy, an 8 Fr peel-away sheath was placed over a guidewire under fluoroscopic guidance. The catheter was then placed through the sheath and the sheath was removed. Final catheter positioning was confirmed and documented with a fluoroscopic spot radiograph. The port was accessed with a Huber needle, aspirated and flushed with heparinized saline. The venotomy site was closed with an interrupted 4-0 Vicryl suture. The port pocket incision was closed with interrupted 2-0 Vicryl suture and the skin was opposed with a running subcuticular 4-0 Vicryl suture. Dermabond and Steri-strips were applied to both incisions. Dressings were placed. The patient tolerated the procedure well without immediate post procedural complication. FINDINGS: After catheter placement, the tip lies within the superior cavoatrial junction. The catheter aspirates and flushes normally and is ready for immediate use. IMPRESSION: Successful placement of a right internal jugular approach power injectable Port-A-Cath. The catheter is ready for immediate use. Electronically Signed   By: Sandi Mariscal M.D.   On: 02/02/2018 16:23    ASSESSMENT & PLAN:   52 y.o. 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 Electrolytes normal -Continue follow up with nutritionist Ernestene Kiel -Continue with radiation -The pt has no prohibitive toxicities from  continuing Cisplatin at this time.  -continue weekly Cisplatin of 4m/m2 -Continue Miralax -Stay physically active, walking at least 20-30 minutes each day   2. Eczematoid skin rash on his lower extremities RT>left. Resolved with anti fungal - fluconazole. Plan -completed po fluconazole with near resolution of rash -conitnue  topical anti-fungals . -Continue moisturizing previously fungal-affected skin areas on bilateral LE, use colloidal silver soap for anti-fungal prevention   -continue weekly Cisplatin with labs as per orders -port flush appointment weekly to have labs drawn through the port a cath. RTC with Dr Irene Limbo in 2 weeks     All of the patients questions were answered with apparent satisfaction. The patient knows to call the clinic with any problems, questions or concerns.  . The total time spent in the appointment was 25 minutes and more than 50% was on counseling and direct patient cares.     Sullivan Lone MD MS AAHIVMS [facility] Hematology/Oncology Physician [facility]  (Office):       [phone] (Work cell):  [phone] (Fax):           [phone]  02/12/2018 3:00 PM  I, Baldwin Jamaica, am acting as a Education administrator for Dr Irene Limbo.   .I have reviewed the above documentation for accuracy and completeness, and I agree with the above. Brunetta Genera MD

## 2018-02-12 NOTE — Progress Notes (Signed)
Oncology Nurse Navigator Documentation  Met with patient during weekly UT with Dr. Isidore Moos. He reported continuing positional discomfort at PEG insertion site. Upon evaluation, expected grayish discharge on dsg, skin under retention ring slightly erythematous. RT positioned at about 4.75 cm, I adjusted to 5.0 cm, redressed site. He stated "feels better". I encouraged him to call me with further needs/concerns.  [name], RN, BSN Head & Neck Oncology Nurse [place] at [place] [phone]

## 2018-02-13 ENCOUNTER — Inpatient Hospital Stay: Payer: PRIVATE HEALTH INSURANCE

## 2018-02-13 ENCOUNTER — Ambulatory Visit
Admission: RE | Admit: 2018-02-13 | Discharge: 2018-02-13 | Disposition: A | Discharge status: Home / Self Care | Payer: PRIVATE HEALTH INSURANCE | Source: Ambulatory Visit | Attending: Radiation Oncology | Admitting: Radiation Oncology

## 2018-02-13 ENCOUNTER — Other Ambulatory Visit (HOSPITAL_COMMUNITY): Payer: Self-pay

## 2018-02-13 ENCOUNTER — Ambulatory Visit (HOSPITAL_COMMUNITY): Payer: Self-pay

## 2018-02-13 ENCOUNTER — Telehealth: Payer: Self-pay | Admitting: *Deleted

## 2018-02-13 ENCOUNTER — Inpatient Hospital Stay: Payer: PRIVATE HEALTH INSURANCE | Admitting: Nutrition

## 2018-02-13 ENCOUNTER — Encounter: Payer: Self-pay | Admitting: *Deleted

## 2018-02-13 VITALS — BP 109/79 | HR 91 | Temp 98.0°F | Resp 18

## 2018-02-13 DIAGNOSIS — Z510 Encounter for antineoplastic radiation therapy: Secondary | ICD-10-CM | POA: Diagnosis not present

## 2018-02-13 DIAGNOSIS — C799 Secondary malignant neoplasm of unspecified site: Secondary | ICD-10-CM

## 2018-02-13 DIAGNOSIS — C090 Malignant neoplasm of tonsillar fossa: Secondary | ICD-10-CM

## 2018-02-13 DIAGNOSIS — Z5111 Encounter for antineoplastic chemotherapy: Secondary | ICD-10-CM | POA: Diagnosis not present

## 2018-02-13 DIAGNOSIS — IMO0002 Reserved for concepts with insufficient information to code with codable children: Secondary | ICD-10-CM

## 2018-02-13 MED ORDER — PALONOSETRON HCL INJECTION 0.25 MG/5ML
INTRAVENOUS | Status: AC
Start: 2018-02-13 — End: ?
  Filled 2018-02-13: qty 5

## 2018-02-13 MED ORDER — HEPARIN SOD (PORK) LOCK FLUSH 100 UNIT/ML IV SOLN
500.0000 [IU] | Freq: Once | INTRAVENOUS | Status: AC | PRN
  Administered 2018-02-13: 500 [IU]
  Filled 2018-02-13: qty 5

## 2018-02-13 MED ORDER — SODIUM CHLORIDE 0.9 % IV SOLN
39.5000 mg/m2 | Freq: Once | INTRAVENOUS | Status: AC
  Administered 2018-02-13: 100 mg via INTRAVENOUS
  Filled 2018-02-13: qty 100

## 2018-02-13 MED ORDER — SODIUM CHLORIDE 0.9 % IV SOLN
Freq: Once | INTRAVENOUS | Status: AC
  Administered 2018-02-13: 08:00:00 via INTRAVENOUS

## 2018-02-13 MED ORDER — SODIUM CHLORIDE 0.9 % IV SOLN
Freq: Once | INTRAVENOUS | Status: AC
  Administered 2018-02-13: 12:00:00 via INTRAVENOUS
  Filled 2018-02-13: qty 5

## 2018-02-13 MED ORDER — PALONOSETRON HCL INJECTION 0.25 MG/5ML
0.2500 mg | Freq: Once | INTRAVENOUS | Status: AC
  Administered 2018-02-13: 0.25 mg via INTRAVENOUS

## 2018-02-13 MED ORDER — SODIUM CHLORIDE 0.9% FLUSH
10.0000 mL | INTRAVENOUS | Status: DC | PRN
  Administered 2018-02-13: 10 mL
  Filled 2018-02-13: qty 10

## 2018-02-13 MED ORDER — POTASSIUM CHLORIDE 2 MEQ/ML IV SOLN
Freq: Once | INTRAVENOUS | Status: AC
  Administered 2018-02-13: 10:00:00 via INTRAVENOUS
  Filled 2018-02-13: qty 10

## 2018-02-13 NOTE — Progress Notes (Signed)
Oncology Nurse Navigator Documentation  Met pt in Infusion to check on well-being since yesterday's PEG retention ring adjustment. He reported notable improvement, denied significant pain/discomfort. I informed him he can expect a call from [facility] to schedule appt for next week for interval audiogram.  He voiced understanding. I encouraged him to call me with needs/concerns.  [name], RN, BSN Head & Neck Oncology Nurse [place] at [place] [phone]

## 2018-02-13 NOTE — Patient Instructions (Signed)
[facility] Discharge Instructions for Patients Receiving Chemotherapy  Today you received the following chemotherapy agents: Cisplatin  To help prevent nausea and vomiting after your treatment, we encourage you to take your nausea medication  as prescribed.    If you develop nausea and vomiting that is not controlled by your nausea medication, call the clinic.   BELOW ARE SYMPTOMS THAT SHOULD BE REPORTED IMMEDIATELY:  *FEVER GREATER THAN 100.5 F  *CHILLS WITH OR WITHOUT FEVER  NAUSEA AND VOMITING THAT IS NOT CONTROLLED WITH YOUR NAUSEA MEDICATION  *UNUSUAL SHORTNESS OF BREATH  *UNUSUAL BRUISING OR BLEEDING  TENDERNESS IN MOUTH AND THROAT WITH OR WITHOUT PRESENCE OF ULCERS  *URINARY PROBLEMS  *BOWEL PROBLEMS  UNUSUAL RASH Items with * indicate a potential emergency and should be followed up as soon as possible.  Feel free to call the clinic should you have any questions or concerns. The clinic phone number is [phone].  Please show the CHEMO ALERT CARD at check-in to the Emergency Department and triage nurse.   

## 2018-02-13 NOTE — Telephone Encounter (Signed)
Oncology Nurse Navigator Documentation  Spoke with [facility], requested audiogram week of 7/8 s/p pt's 2nd cycle HD cistplatin 7/1.  [name], RN, BSN Head & Neck Oncology Nurse [place] at [place] [phone]

## 2018-02-13 NOTE — Progress Notes (Signed)
Nutrition follow-up completed with patient receiving chemoradiation therapy for P 16+ tonsil cancer. Patient is not using his PEG yet. Noted weight decreased and documented as 269.3 pounds July 1 down from 275.2 pounds June 25. Noted glucose 130. Patient reports he is eating a regular diet, just less. (one plate of food vs. 2 plates of food) His wife prepares milkshakes for him. Patient denies nutrition impact symptoms.  Estimated nutrition needs: 2400-2600 cal, 135-150 g protein, 2.7 L fluid.  Nutrition diagnosis:  Predicted suboptimal energy intake has evolved into inadequate oral intake related to tonsil cancer and associated treatments as evidenced by 2% weight loss over 1 week.  Intervention: I educated patient to consume higher calorie, higher protein foods. Recommended he try oral nutrition supplements or milkshakes at least 1-2 daily. Encourage patient to continue bowel regimen. Questions were answered.  Teach back method used.  Monitoring, evaluation, goals: Patient will work to increase calories and protein to minimize weight loss. I will continue to evaluate weight and begin PEG feedings as needed.  Next visit: Tuesday, July 9 during infusion.  **Disclaimer: This note was dictated with voice recognition software. Similar sounding words can inadvertently be transcribed and this note may contain transcription errors which may not have been corrected upon publication of note.**

## 2018-02-14 ENCOUNTER — Telehealth (HOSPITAL_COMMUNITY): Payer: Self-pay

## 2018-02-14 ENCOUNTER — Ambulatory Visit
Admission: RE | Admit: 2018-02-14 | Discharge: 2018-02-14 | Disposition: A | Discharge status: Home / Self Care | Payer: PRIVATE HEALTH INSURANCE | Source: Ambulatory Visit | Attending: Radiation Oncology | Admitting: Radiation Oncology

## 2018-02-14 DIAGNOSIS — Z510 Encounter for antineoplastic radiation therapy: Secondary | ICD-10-CM | POA: Diagnosis not present

## 2018-02-16 ENCOUNTER — Ambulatory Visit
Admission: RE | Admit: 2018-02-16 | Discharge: 2018-02-16 | Disposition: A | Discharge status: Home / Self Care | Payer: PRIVATE HEALTH INSURANCE | Source: Ambulatory Visit | Attending: Radiation Oncology | Admitting: Radiation Oncology

## 2018-02-16 DIAGNOSIS — Z510 Encounter for antineoplastic radiation therapy: Secondary | ICD-10-CM | POA: Diagnosis not present

## 2018-02-18 DIAGNOSIS — Z7189 Other specified counseling: Secondary | ICD-10-CM | POA: Insufficient documentation

## 2018-02-19 ENCOUNTER — Ambulatory Visit (HOSPITAL_COMMUNITY): Payer: Medicaid - Dental | Admitting: Dentistry

## 2018-02-19 ENCOUNTER — Ambulatory Visit
Admission: RE | Admit: 2018-02-19 | Discharge: 2018-02-19 | Disposition: A | Discharge status: Home / Self Care | Payer: PRIVATE HEALTH INSURANCE | Source: Ambulatory Visit | Attending: Radiation Oncology | Admitting: Radiation Oncology

## 2018-02-19 ENCOUNTER — Inpatient Hospital Stay: Payer: PRIVATE HEALTH INSURANCE

## 2018-02-19 ENCOUNTER — Encounter (HOSPITAL_COMMUNITY): Payer: Self-pay | Admitting: Dentistry

## 2018-02-19 ENCOUNTER — Telehealth: Payer: Self-pay | Admitting: Hematology

## 2018-02-19 VITALS — BP 131/81 | HR 82 | Temp 98.0°F | Wt 268.0 lb

## 2018-02-19 DIAGNOSIS — C799 Secondary malignant neoplasm of unspecified site: Secondary | ICD-10-CM

## 2018-02-19 DIAGNOSIS — R1310 Dysphagia, unspecified: Secondary | ICD-10-CM

## 2018-02-19 DIAGNOSIS — R432 Parageusia: Secondary | ICD-10-CM

## 2018-02-19 DIAGNOSIS — C090 Malignant neoplasm of tonsillar fossa: Secondary | ICD-10-CM

## 2018-02-19 DIAGNOSIS — K117 Disturbances of salivary secretion: Secondary | ICD-10-CM

## 2018-02-19 DIAGNOSIS — R682 Dry mouth, unspecified: Secondary | ICD-10-CM

## 2018-02-19 DIAGNOSIS — Z95828 Presence of other vascular implants and grafts: Secondary | ICD-10-CM

## 2018-02-19 DIAGNOSIS — IMO0002 Reserved for concepts with insufficient information to code with codable children: Secondary | ICD-10-CM

## 2018-02-19 DIAGNOSIS — R252 Cramp and spasm: Secondary | ICD-10-CM

## 2018-02-19 DIAGNOSIS — Z510 Encounter for antineoplastic radiation therapy: Secondary | ICD-10-CM | POA: Diagnosis not present

## 2018-02-19 DIAGNOSIS — Z5111 Encounter for antineoplastic chemotherapy: Secondary | ICD-10-CM | POA: Diagnosis not present

## 2018-02-19 DIAGNOSIS — K1233 Oral mucositis (ulcerative) due to radiation: Secondary | ICD-10-CM

## 2018-02-19 DIAGNOSIS — K1230 Oral mucositis (ulcerative), unspecified: Secondary | ICD-10-CM

## 2018-02-19 LAB — CMP (CANCER CENTER ONLY)
ALBUMIN: 3.8 g/dL (ref 3.5–5.0)
ALK PHOS: 83 U/L (ref 38–126)
ALT: 43 U/L (ref 0–44)
AST: 15 U/L (ref 15–41)
Anion gap: 9 (ref 5–15)
BUN: 17 mg/dL (ref 6–20)
CALCIUM: 9.8 mg/dL (ref 8.9–10.3)
CO2: 30 mmol/L (ref 22–32)
CREATININE: 0.82 mg/dL (ref 0.61–1.24)
Chloride: 97 mmol/L — ABNORMAL LOW (ref 98–111)
GFR, Est AFR Am: >60 mL/min (ref >60)
GLUCOSE: 99 mg/dL (ref 70–99)
Potassium: 4 mmol/L (ref 3.5–5.1)
Sodium: 136 mmol/L (ref 135–145)
TOTAL PROTEIN: 7.1 g/dL (ref 6.5–8.1)
Total Bilirubin: 0.5 mg/dL (ref 0.3–1.2)

## 2018-02-19 LAB — CBC WITH DIFFERENTIAL/PLATELET
BASOS ABS: 0 10*3/uL (ref 0.0–0.1)
Basophils Relative: 0 %
EOS ABS: 0.1 10*3/uL (ref 0.0–0.5)
EOS PCT: 2 %
HCT: 55.2 % — ABNORMAL HIGH (ref 38.4–49.9)
Hemoglobin: 16.7 g/dL (ref 13.0–17.1)
LYMPHS PCT: 16 %
Lymphs Abs: 1.2 10*3/uL (ref 0.9–3.3)
MCH: 29.3 pg (ref 27.2–33.4)
MCHC: 30.3 g/dL — ABNORMAL LOW (ref 32.0–36.0)
MCV: 96.8 fL (ref 79.3–98.0)
MONO ABS: 0.7 10*3/uL (ref 0.1–0.9)
Monocytes Relative: 10 %
Neutro Abs: 5.5 10*3/uL (ref 1.5–6.5)
Neutrophils Relative %: 72 %
PLATELETS: 145 10*3/uL (ref 140–400)
RBC: 5.7 MIL/uL (ref 4.20–5.82)
RDW: 14 % (ref 11.0–14.6)
WBC: 7.5 10*3/uL (ref 4.0–10.3)

## 2018-02-19 LAB — MAGNESIUM: Magnesium: 1.9 mg/dL (ref 1.7–2.4)

## 2018-02-19 LAB — PHOSPHORUS: Phosphorus: 3.9 mg/dL (ref 2.5–4.6)

## 2018-02-19 MED ORDER — SODIUM CHLORIDE 0.9% FLUSH
10.0000 mL | INTRAVENOUS | Status: DC | PRN
  Administered 2018-02-19: 10 mL via INTRAVENOUS
  Filled 2018-02-19: qty 10

## 2018-02-19 MED ORDER — HEPARIN SOD (PORK) LOCK FLUSH 100 UNIT/ML IV SOLN
500.0000 [IU] | Freq: Once | INTRAVENOUS | Status: AC
  Administered 2018-02-19: 500 [IU] via INTRAVENOUS
  Filled 2018-02-19: qty 5

## 2018-02-19 NOTE — Progress Notes (Signed)
02/19/2018  Patient Name:   Travis Mcdonald Date of Birth:   Apr 07, 1966 Medical Record Number: [id number]  BP 131/81   Pulse 82   Temp 98 F (36.7 C)   Wt 268 lb (121.6 kg)   BMI 34.41 kg/m   [name] presents for oral examination during chemoradiation therapy. Patient has completed 11 of 35 radiation treatments and 2/7 weekly chemotherapy treatments.  REVIEW OF CHIEF COMPLAINTS:  DRY MOUTH:  Yes. HARD TO SWALLOW: Yes.  HURT TO SWALLOW: Yes. TASTE CHANGES: The taste is starting to change. SORES IN MOUTH: Yes. TRISMUS: Patient has some discomfort on the left side. WEIGHT: 268 lbs down from initial 272 pounds  HOME OH REGIMEN:  BRUSHING: Once a day. The patient was encouraged to brush after meals and at bedtime but at least twice a day. FLOSSING: No RINSING: Using salt water and baking soda rinses. FLUORIDE: Using fluoride at bedtime but not consistently. TRISMUS EXERCISES:  Maximum interincisal opening: 45 mm down from initial 50 mm. Patient was instructed to start using trismus exercises twice daily.   DENTAL EXAM:  Oral Hygiene:(PLAQUE): Plaque and calculus noted. Oral hygiene improvement was highly suggested. LOCATION OF MUCOSITIS:  Left tonsillar area and back of throat. DESCRIPTION OF SALIVA: Decreased saliva. ANY EXPOSED BONE: None noted OTHER WATCHED AREAS: Extraction sites of wisdom teeth numbers 1, 16, 17, and 32.  All extraction sites appear to be healing in well. DX: Xerostomia, Dysgeusia, Dysphagia, Odynophagia, Weight Loss, Accretions, Trimus and Mucositis  RECOMMENDATIONS: 1. Brush after meals and at bedtime.  Use fluoride at bedtime. 2. Use trismus exercises as directed. Suggested exercises twice daily. 3. Use Biotene Rinse or salt water/baking soda rinses. 4. Multiple sips of water as needed. 5. Return to clinic in two months for oral exam after chemoradiation therapy. Call if problems before then.  Lenn Cal, DDS

## 2018-02-19 NOTE — Progress Notes (Signed)
Completed on 02/19/2018 

## 2018-02-19 NOTE — Patient Instructions (Signed)
RECOMMENDATIONS: 1. Brush after meals and at bedtime.  Use fluoride at bedtime. 2. Use trismus exercises as directed. Suggested exercises twice daily. 3. Use Biotene Rinse or salt water/baking soda rinses. 4. Multiple sips of water as needed. 5. Return to clinic in two months for oral exam after chemoradiation therapy. Call if problems before then.  Lenn Cal, DDS

## 2018-02-19 NOTE — Telephone Encounter (Signed)
Called pt re appts that were changed - moved 7/9 appt to 8am due to template changes. Spoke w/ pt re changes. Said he would call back after speaking with his wife.

## 2018-02-20 ENCOUNTER — Inpatient Hospital Stay: Payer: PRIVATE HEALTH INSURANCE | Admitting: Nutrition

## 2018-02-20 ENCOUNTER — Ambulatory Visit
Admission: RE | Admit: 2018-02-20 | Discharge: 2018-02-20 | Disposition: A | Discharge status: Home / Self Care | Payer: PRIVATE HEALTH INSURANCE | Source: Ambulatory Visit | Attending: Radiation Oncology | Admitting: Radiation Oncology

## 2018-02-20 ENCOUNTER — Inpatient Hospital Stay: Payer: PRIVATE HEALTH INSURANCE

## 2018-02-20 ENCOUNTER — Other Ambulatory Visit: Payer: Self-pay | Admitting: Medical

## 2018-02-20 ENCOUNTER — Inpatient Hospital Stay (HOSPITAL_BASED_OUTPATIENT_CLINIC_OR_DEPARTMENT_OTHER): Payer: PRIVATE HEALTH INSURANCE | Admitting: Medical

## 2018-02-20 VITALS — BP 120/76 | HR 86 | Temp 98.4°F | Resp 18

## 2018-02-20 DIAGNOSIS — C090 Malignant neoplasm of tonsillar fossa: Secondary | ICD-10-CM

## 2018-02-20 DIAGNOSIS — Z79899 Other long term (current) drug therapy: Secondary | ICD-10-CM | POA: Diagnosis not present

## 2018-02-20 DIAGNOSIS — Z931 Gastrostomy status: Secondary | ICD-10-CM

## 2018-02-20 DIAGNOSIS — C799 Secondary malignant neoplasm of unspecified site: Secondary | ICD-10-CM

## 2018-02-20 DIAGNOSIS — Z5111 Encounter for antineoplastic chemotherapy: Secondary | ICD-10-CM | POA: Diagnosis not present

## 2018-02-20 DIAGNOSIS — IMO0002 Reserved for concepts with insufficient information to code with codable children: Secondary | ICD-10-CM

## 2018-02-20 DIAGNOSIS — C099 Malignant neoplasm of tonsil, unspecified: Secondary | ICD-10-CM

## 2018-02-20 DIAGNOSIS — J069 Acute upper respiratory infection, unspecified: Secondary | ICD-10-CM | POA: Diagnosis not present

## 2018-02-20 DIAGNOSIS — Z7189 Other specified counseling: Secondary | ICD-10-CM

## 2018-02-20 DIAGNOSIS — T85848A Pain due to other internal prosthetic devices, implants and grafts, initial encounter: Secondary | ICD-10-CM

## 2018-02-20 DIAGNOSIS — Z510 Encounter for antineoplastic radiation therapy: Secondary | ICD-10-CM | POA: Diagnosis not present

## 2018-02-20 MED ORDER — SODIUM CHLORIDE 0.9 % IV SOLN
39.5000 mg/m2 | Freq: Once | INTRAVENOUS | Status: AC
  Administered 2018-02-20: 100 mg via INTRAVENOUS
  Filled 2018-02-20: qty 100

## 2018-02-20 MED ORDER — HEPARIN SOD (PORK) LOCK FLUSH 100 UNIT/ML IV SOLN
500.0000 [IU] | Freq: Once | INTRAVENOUS | Status: AC | PRN
  Administered 2018-02-20: 500 [IU]
  Filled 2018-02-20: qty 5

## 2018-02-20 MED ORDER — PALONOSETRON HCL INJECTION 0.25 MG/5ML
INTRAVENOUS | Status: AC
  Filled 2018-02-20: qty 5

## 2018-02-20 MED ORDER — PALONOSETRON HCL INJECTION 0.25 MG/5ML
0.2500 mg | Freq: Once | INTRAVENOUS | Status: AC
  Administered 2018-02-20: 0.25 mg via INTRAVENOUS

## 2018-02-20 MED ORDER — MANNITOL 25 % IV SOLN
Freq: Once | INTRAVENOUS | Status: AC
  Administered 2018-02-20: 10:00:00 via INTRAVENOUS
  Filled 2018-02-20: qty 10

## 2018-02-20 MED ORDER — FOSAPREPITANT DIMEGLUMINE INJECTION 150 MG
Freq: Once | INTRAVENOUS | Status: AC
  Administered 2018-02-20: 12:00:00 via INTRAVENOUS
  Filled 2018-02-20: qty 5

## 2018-02-20 MED ORDER — SODIUM CHLORIDE 0.9% FLUSH
10.0000 mL | INTRAVENOUS | Status: DC | PRN
  Administered 2018-02-20: 10 mL
  Filled 2018-02-20: qty 10

## 2018-02-20 MED ORDER — AMOXICILLIN-POT CLAVULANATE 875-125 MG PO TABS: 1.0000 | ORAL_TABLET | Freq: Two times a day (BID) | ORAL | 0 refills | Status: DC

## 2018-02-20 NOTE — Progress Notes (Signed)
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   Patient is not using his PEG yet. Weight decreased slightly and documented as 268 pounds July 8 decreased from 269.3 pounds July 1. Patient reports he continues to eat softer textures of food. He has been drinking 1 milkshake daily.   He tried oral nutrition supplements but has not started them yet. He denies nutrition impact symptoms.  Estimated nutrition needs: 2400-2600 cal, 135-150 g protein, 2.7 L fluid.  Nutrition diagnosis: Inadequate oral intake continues.  Intervention: Educated patient to continue high-calorie, high-protein foods and alternate textures as needed. Recommended patient try boost plus twice daily and provided coupons. Recommended increased and adequate hydration. Touch back method used.  Monitoring, evaluation, goals: Patient will continue to increase oral intake to minimize weight loss. I will continue to evaluate weight and begin PEG feedings as needed.  Next visit: Monday, July 15 during infusion.  **Disclaimer: This note was dictated with voice recognition software. Similar sounding words can inadvertently be transcribed and this note may contain transcription errors which may not have been corrected upon publication of note.**

## 2018-02-20 NOTE — Patient Instructions (Signed)
[facility] Discharge Instructions for Patients Receiving Chemotherapy  Today you received the following chemotherapy agents: Cisplatin  To help prevent nausea and vomiting after your treatment, we encourage you to take your nausea medication  as prescribed.    If you develop nausea and vomiting that is not controlled by your nausea medication, call the clinic.   BELOW ARE SYMPTOMS THAT SHOULD BE REPORTED IMMEDIATELY:  *FEVER GREATER THAN 100.5 F  *CHILLS WITH OR WITHOUT FEVER  NAUSEA AND VOMITING THAT IS NOT CONTROLLED WITH YOUR NAUSEA MEDICATION  *UNUSUAL SHORTNESS OF BREATH  *UNUSUAL BRUISING OR BLEEDING  TENDERNESS IN MOUTH AND THROAT WITH OR WITHOUT PRESENCE OF ULCERS  *URINARY PROBLEMS  *BOWEL PROBLEMS  UNUSUAL RASH Items with * indicate a potential emergency and should be followed up as soon as possible.  Feel free to call the clinic should you have any questions or concerns. The clinic phone number is [phone].  Please show the CHEMO ALERT CARD at check-in to the Emergency Department and triage nurse.   

## 2018-02-20 NOTE — Progress Notes (Signed)
Pt concerned about new bleeding around PEG site. Bleeding has been minimal, and only noticed when changing the gauze at the site. Pt reports no constant pain, but occasional sharp pains at site. VSS upon arrival to the Infusion unit. No reports of fever, chills.  Redness and minimal bleeding observed upon inspection of PEG site. Sandi Mealy, PA notified and asked to see Pt. Will continue to follow.

## 2018-02-21 ENCOUNTER — Ambulatory Visit
Admission: RE | Admit: 2018-02-21 | Discharge: 2018-02-21 | Disposition: A | Discharge status: Home / Self Care | Payer: PRIVATE HEALTH INSURANCE | Source: Ambulatory Visit | Attending: Radiation Oncology | Admitting: Radiation Oncology

## 2018-02-21 ENCOUNTER — Encounter: Payer: Self-pay | Admitting: *Deleted

## 2018-02-21 DIAGNOSIS — Z510 Encounter for antineoplastic radiation therapy: Secondary | ICD-10-CM | POA: Diagnosis not present

## 2018-02-22 ENCOUNTER — Ambulatory Visit
Admission: RE | Admit: 2018-02-22 | Discharge: 2018-02-22 | Disposition: A | Discharge status: Home / Self Care | Payer: PRIVATE HEALTH INSURANCE | Source: Ambulatory Visit | Attending: Radiation Oncology | Admitting: Radiation Oncology

## 2018-02-22 DIAGNOSIS — Z510 Encounter for antineoplastic radiation therapy: Secondary | ICD-10-CM | POA: Diagnosis not present

## 2018-02-22 NOTE — Progress Notes (Signed)
[facility] Progress Note   Travis Mcdonald [id number] December 01, 1965 52 y.o.  Travis Mcdonald is managed by Dr. Sullivan Lone  Actively treated with chemotherapy/immunotherapy: yes  Current Therapy: Concurrent chemoradiation with cisplatin  Last Treated: 02/20/2018 (cycle 3, day 1)  Assessment: Plan:    Upper respiratory tract infection, unspecified type  Pain around PEG tube site, initial encounter   Upper respiratory tract infection: The patient was given a prescription for Augmentin 875-125 p.o. twice daily x7 days.  Pain PEG tube insertion site: The exterior button on the patient's PEG tube was loosened by 1/2 cm from 5 to 5.5 cm.  The area was cleaned with a new drain sponge placed.  Please see After Visit Summary for patient specific instructions.  Future Appointments  Date Time Provider [place]  02/22/2018  3:30 PM [facility] LINAC 3 [facility] None  02/23/2018  3:30 PM [facility] LINAC 3 [facility] None  02/26/2018  8:00 AM [facility] LAB 1 [facility] None  02/26/2018  8:15 AM [facility] LINAC 3 [facility] None  02/26/2018  8:15 AM [facility] [place] FLUSH [facility] None  02/26/2018  8:40 AM Brunetta Genera, MD [facility] None  02/26/2018  9:00 AM [facility] INFUSION [facility] None  02/26/2018 10:30 AM Neff, Barbara L, RD [facility] None  02/27/2018  3:30 PM [facility] LINAC 3 [facility] None  02/28/2018  3:30 PM [facility] LINAC 3 [facility] None  03/01/2018  3:30 PM [facility] LINAC 3 [facility] None  03/02/2018  3:30 PM [facility] LINAC 3 [facility] None  03/05/2018  3:30 PM [facility] LINAC 3 [facility] None  03/06/2018  7:45 AM [facility] LAB 1 [facility] None  03/06/2018  8:00 AM [facility] [place] FLUSH [facility] None  03/06/2018  8:15 AM [facility] LINAC 3 [facility] None  03/06/2018  9:00 AM [facility] INFUSION [facility] None  03/07/2018  3:30 PM [facility] LINAC 3 [facility] None  03/08/2018  3:30 PM [facility] LINAC 3  [facility] None  03/09/2018  3:30 PM [facility] LINAC 3 [facility] None  03/12/2018  4:00 PM [facility] LINAC 3 [facility] None  03/13/2018  4:00 PM [facility] LINAC 3 [facility] None  03/14/2018  7:45 AM [facility] LAB 4 [facility] None  03/14/2018  8:00 AM [facility] [place] FLUSH [facility] None  03/14/2018  9:30 AM [facility] INFUSION [facility] None  03/14/2018  4:00 PM [facility] LINAC 3 [facility] None  03/15/2018  4:00 PM [facility] LINAC 3 [facility] None  03/16/2018  4:00 PM [facility] LINAC 3 [facility] None  03/19/2018  8:00 AM [facility] LAB 6 [facility] None  03/19/2018  8:15 AM [facility] [place] FLUSH [facility] None  03/19/2018  8:30 AM [facility] LINAC 3 [facility] None  03/19/2018  9:00 AM [facility] INFUSION [facility] None  03/19/2018  9:45 AM Neff, Barbara L, RD [facility] None  03/20/2018  4:00 PM [facility] LINAC 3 [facility] None  03/21/2018  4:00 PM [facility] LINAC 3 [facility] None  03/22/2018  4:00 PM [facility] LINAC 3 [facility] None  03/23/2018  4:00 PM [facility] LINAC 3 [facility] None  03/26/2018  4:00 PM [facility] LINAC 3 [facility] None  04/24/2018 11:15 AM Lenn Cal, DDS [facility] None    No orders of the defined types were placed in this encounter.      Subjective:   Patient ID:  Travis Mcdonald is a 52 y.o. (DOB 01/24/1966) male.  Chief Complaint: No chief complaint on file.   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  The patient is managed by Dr. Sullivan Lone and is currently treated with concurrent chemoradiation with cisplatin.  He is receiving cycle 3, day 1 of cisplatin today and is status post 11 fractions of radiation therapy.  I was asked to see the patient while he was in the infusion room.  The patient reported that he was having some discomfort at his PEG tube insertion site.  He also reported ear fullness, a  cough with yellow-green sputum production.  He denies fevers, chills, or sweats.  He continues to have p.o. intake.  Medications: I have reviewed the patient's current medications.  Allergies: No Known Allergies  Past Medical History:  Diagnosis Date  . Hypertension     Past Surgical History:  Procedure Laterality Date  . [place]   ruptured lower back disc surgery. L4 & L5 Lumber disc  . IR GASTROSTOMY TUBE MOD SED  02/02/2018  . IR IMAGING GUIDED PORT INSERTION  02/02/2018    No family history on file.  Social History   Socioeconomic History  . Marital status: Married    Spouse name: Not on file  . Number of children: Not on file  . Years of education: Not on file  . Highest education level: Not on file  Occupational History  . Not on file  Social Needs  . Financial resource strain: Not on file  . Food insecurity:    Worry: Not on file    Inability: Not on file  . Transportation needs:    Medical: Not on file    Non-medical: Not on file  Tobacco Use  . Smoking status: Never Smoker  . Smokeless tobacco: Never Used  Substance and Sexual Activity  . Alcohol use: No    Comment: no alcohol for 30 years.   . Drug use: No  . Sexual activity: Not on file  Lifestyle  . Physical activity:    Days per week: Not on file    Minutes per session: Not on file  . Stress: Not on file  Relationships  . Social connections:    Talks on phone: Not on file    Gets together: Not on file    Attends religious service: Not on file    Active member of club or organization: Not on file    Attends meetings of clubs or organizations: Not on file    Relationship status: Not on file  . Intimate partner violence:    Fear of current or ex partner: Not on file    Emotionally abused: Not on file    Physically abused: Not on file    Forced sexual activity: Not on file  Other Topics Concern  . Not on file  Social History Narrative  . Not on file    Past Medical  History, Surgical history, Social history, and Family history were reviewed and updated as appropriate.   Please see review of systems for further details on the patient's review from today.   Review of Systems:  Review of Systems  Constitutional: Negative for chills, diaphoresis and fever.  HENT: Negative for congestion, postnasal drip, rhinorrhea, sinus pressure, sinus pain, sneezing and sore throat.   Respiratory: Positive for cough. Negative for choking, chest tightness, shortness of breath and wheezing.   Cardiovascular: Negative for chest pain and palpitations.  Gastrointestinal: Positive for abdominal pain (Pain at PEG tube insertion site.).  Neurological: Negative for headaches.    Objective:   Physical Exam:  There  were no vitals taken for this visit. ECOG: 0  Physical Exam  Constitutional: No distress.  HENT:  Head: Normocephalic.  Nose: Right sinus exhibits no maxillary sinus tenderness and no frontal sinus tenderness. Left sinus exhibits no maxillary sinus tenderness and no frontal sinus tenderness.  Mouth/Throat: Oropharynx is clear and moist. No oropharyngeal exudate.  Eyes: Right eye exhibits no discharge. Left eye exhibits no discharge. No scleral icterus.  Neck: Normal range of motion. Neck supple.  Cardiovascular: Normal rate, regular rhythm and normal heart sounds. Exam reveals no gallop and no friction rub.  No murmur heard. Pulmonary/Chest: Effort normal and breath sounds normal. No stridor. No respiratory distress. He has no wheezes. He has no rales. He exhibits no tenderness.  Abdominal: Soft. Bowel sounds are normal. He exhibits no distension and no mass. There is tenderness (Tenderness at PEG tube insertion site.). There is no rebound and no guarding.    Lymphadenopathy:    He has no cervical adenopathy.  Neurological: He is alert. Coordination normal.  Skin: Skin is warm and dry. He is not diaphoretic.  Psychiatric: He has a normal mood and affect. His  behavior is normal. Judgment and thought content normal.    Lab Review:     Component Value Date/Time   NA 136 02/19/2018 1447   K 4.0 02/19/2018 1447   CL 97 (L) 02/19/2018 1447   CO2 30 02/19/2018 1447   GLUCOSE 99 02/19/2018 1447   BUN 17 02/19/2018 1447   CREATININE 0.82 02/19/2018 1447   CALCIUM 9.8 02/19/2018 1447   PROT 7.1 02/19/2018 1447   ALBUMIN 3.8 02/19/2018 1447   AST 15 02/19/2018 1447   ALT 43 02/19/2018 1447   ALKPHOS 83 02/19/2018 1447   BILITOT 0.5 02/19/2018 1447   GFRNONAA >60 02/19/2018 1447   GFRAA >60 02/19/2018 1447       Component Value Date/Time   WBC 7.5 02/19/2018 1447   RBC 5.70 02/19/2018 1447   HGB 16.7 02/19/2018 1447   HGB 16.6 02/12/2018 1341   HCT 55.2 (H) 02/19/2018 1447   PLT 145 02/19/2018 1447   PLT 250 02/12/2018 1341   MCV 96.8 02/19/2018 1447   MCH 29.3 02/19/2018 1447   MCHC 30.3 (L) 02/19/2018 1447   RDW 14.0 02/19/2018 1447   LYMPHSABS 1.2 02/19/2018 1447   MONOABS 0.7 02/19/2018 1447   EOSABS 0.1 02/19/2018 1447   BASOSABS 0.0 02/19/2018 1447   -------------------------------  Imaging from last 24 hours (if applicable):  Radiology interpretation: Ir Gastrostomy Tube  Result Date: 02/02/2018 INDICATION: History of head neck cancer - please place percutaneous gastrostomy tube for enteric nutrition supplementation purposes during impending chemoradiation. EXAM: PULL TROUGH GASTROSTOMY TUBE PLACEMENT COMPARISON:  PET-CT - 01/04/2018 MEDICATIONS: Ancef 2 g IV; Antibiotics were administered within 1 hour of the procedure. Glucagon 1 mg IV CONTRAST:  25 cc Isovue 300 administered into the gastric lumen. ANESTHESIA/SEDATION: Moderate (conscious) sedation was employed during this procedure. A total of Versed 1 mg and Fentanyl 25 mcg was administered intravenously. Moderate Sedation Time: 12 minutes. The patient's level of consciousness and vital signs were monitored continuously by radiology nursing throughout the procedure under  my direct supervision. FLUOROSCOPY TIME:  42 seconds (5 mGy) COMPLICATIONS: None immediate. PROCEDURE: Informed written consent was obtained from the patient following explanation of the procedure, risks, benefits and alternatives. A time out was performed prior to the initiation of the procedure. Ultrasound scanning was performed to demarcate the edge of the left lobe of the liver.  Maximal barrier sterile technique utilized including caps, mask, sterile gowns, sterile gloves, large sterile drape, hand hygiene and Betadine prep. The left upper quadrant was sterilely prepped and draped. An oral gastric catheter was inserted into the stomach under fluoroscopy. The existing nasogastric feeding tube was removed. The left costal margin and air opacified transverse colon were identified and avoided. Air was injected into the stomach for insufflation and visualization under fluoroscopy. Under sterile conditions a 17 gauge trocar needle was utilized to access the stomach percutaneously beneath the left subcostal margin after the overlying soft tissues were anesthetized with 1% Lidocaine with epinephrine. Needle position was confirmed within the stomach with aspiration of air and injection of small amount of contrast. A single T tack was deployed for gastropexy. Over an Amplatz guide wire, a 9-French sheath was inserted into the stomach. A snare device was utilized to capture the oral gastric catheter. The snare device was pulled retrograde from the stomach up the esophagus and out the oropharynx. The 20-French pull-through gastrostomy was connected to the snare device and pulled antegrade through the oropharynx down the esophagus into the stomach and then through the percutaneous tract external to the patient. The gastrostomy was assembled externally. Contrast injection confirms position in the stomach. Several spot radiographic images were obtained in various obliquities for documentation. The patient tolerated procedure  well without immediate post procedural complication. FINDINGS: After successful fluoroscopic guided placement, the gastrostomy tube is appropriately positioned with internal disc against the ventral aspect of the gastric lumen. IMPRESSION: Successful fluoroscopic insertion of a 20-French pull-through gastrostomy tube. The gastrostomy may be used immediately for medication administration and in 24 hrs for the initiation of feeds. Electronically Signed   By: Sandi Mariscal M.D.   On: 02/02/2018 16:25   Ir Imaging Guided Port Insertion  Result Date: 02/02/2018 INDICATION: History of head neck cancer, in need of durable intravenous access for chemotherapy administration. EXAM: IMPLANTED PORT A CATH PLACEMENT WITH ULTRASOUND AND FLUOROSCOPIC GUIDANCE COMPARISON:  PET-CT - 01/04/2018; contrast-enhanced neck CT - 12/20/2017 MEDICATIONS: Ancef 2 gm IV; The antibiotic was administered within an appropriate time interval prior to skin puncture. ANESTHESIA/SEDATION: Moderate (conscious) sedation was employed during this procedure. A total of Versed 2.5 mg and Fentanyl 125 mcg was administered intravenously. Moderate Sedation Time: 29 minutes. The patient's level of consciousness and vital signs were monitored continuously by radiology nursing throughout the procedure under my direct supervision. CONTRAST:  None FLUOROSCOPY TIME:  36 seconds (5 mGy) COMPLICATIONS: None immediate. PROCEDURE: The procedure, risks, benefits, and alternatives were explained to the patient. Questions regarding the procedure were encouraged and answered. The patient understands and consents to the procedure. The right neck and chest were prepped with chlorhexidine in a sterile fashion, and a sterile drape was applied covering the operative field. Maximum barrier sterile technique with sterile gowns and gloves were used for the procedure. A timeout was performed prior to the initiation of the procedure. Local anesthesia was provided with 1% lidocaine  with epinephrine. After creating a small venotomy incision, a micropuncture kit was utilized to access the internal jugular vein. Real-time ultrasound guidance was utilized for vascular access including the acquisition of a permanent ultrasound image documenting patency of the accessed vessel. The microwire was utilized to measure appropriate catheter length. A subcutaneous port pocket was then created along the upper chest wall utilizing a combination of sharp and blunt dissection. The pocket was irrigated with sterile saline. A single lumen thin power injectable port was chosen for placement. The  8 Fr catheter was tunneled from the port pocket site to the venotomy incision. The port was placed in the pocket. The external catheter was trimmed to appropriate length. At the venotomy, an 8 Fr peel-away sheath was placed over a guidewire under fluoroscopic guidance. The catheter was then placed through the sheath and the sheath was removed. Final catheter positioning was confirmed and documented with a fluoroscopic spot radiograph. The port was accessed with a Huber needle, aspirated and flushed with heparinized saline. The venotomy site was closed with an interrupted 4-0 Vicryl suture. The port pocket incision was closed with interrupted 2-0 Vicryl suture and the skin was opposed with a running subcuticular 4-0 Vicryl suture. Dermabond and Steri-strips were applied to both incisions. Dressings were placed. The patient tolerated the procedure well without immediate post procedural complication. FINDINGS: After catheter placement, the tip lies within the superior cavoatrial junction. The catheter aspirates and flushes normally and is ready for immediate use. IMPRESSION: Successful placement of a right internal jugular approach power injectable Port-A-Cath. The catheter is ready for immediate use. Electronically Signed   By: Sandi Mariscal M.D.   On: 02/02/2018 16:23

## 2018-02-23 ENCOUNTER — Ambulatory Visit
Admission: RE | Admit: 2018-02-23 | Discharge: 2018-02-23 | Disposition: A | Discharge status: Home / Self Care | Payer: PRIVATE HEALTH INSURANCE | Source: Ambulatory Visit | Attending: Radiation Oncology | Admitting: Radiation Oncology

## 2018-02-23 DIAGNOSIS — Z510 Encounter for antineoplastic radiation therapy: Secondary | ICD-10-CM | POA: Diagnosis not present

## 2018-02-23 NOTE — Progress Notes (Signed)
Oncology Nurse Navigator Documentation  Met with Travis Mcdonald after today's RT to check on his well-being. He denied questions/concerns. I encouraged him to contact me if needed.  He voiced understanding.  [name], RN, BSN Head & Neck Oncology Nurse [facility] [place] [phone]

## 2018-02-23 NOTE — Progress Notes (Signed)
HEMATOLOGY/ONCOLOGY CLINIC NOTE  Date of Service: 02/26/2018  Patient Care Team: Celene Squibb, MD as PCP - General (Internal Medicine) Rozetta Nunnery, MD as Consulting Physician (Otolaryngology) Eppie Gibson, MD as Attending Physician (Radiation Oncology) Brunetta Genera, MD as Consulting Physician (Hematology) Leota Sauers, RN as Oncology Nurse Navigator Karie Mainland, RD as Dietitian (Nutrition) Valentino Saxon [place], CCC-SLP as Speech Language Pathologist (Speech Pathology) Wynelle Beckmann, Melodie Bouillon, PT as Physical Therapist (Physical Therapy) Kennith Center, LCSW as Social Worker  CHIEF COMPLAINTS/PURPOSE OF CONSULTATION:  F/u for Tonsillar carcinoma   HISTORY OF PRESENTING ILLNESS:   Travis Mcdonald is a wonderful 52 y.o. 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 He is accompanied today by his wife. The pt reports that he is doing well overall.   The pt reports first noticing an enlargement of his left neck around January 2019. He believed these to be sinus related since he took sinus medications which decreased the swelling. He notes aggravating pain but denies significant pain or problems swallowing. He reports left ear and jaw pain as well.   Four teeth extraction on 01/19/18 with radiation planning this afternoon and tentative radiation beginning 02/05/18.   He denies any ear infection concerns after his audiogram. He denies recurrent ear infections but notes that he had two ear infections last year.    The pt notes that he will try to continue work each day.  He adds that he has a skin rash on his right upper leg which is recurrent in the summers. He denies seeing a dermatologist in the past and treats the associated itching with cortisone and benadryl.   He notes that he does snore at night and his wife notes that he does not snore when he sleeps on his side.   Of note prior to the patient's  visit today, pt has had PET/CT completed on 01/04/18 with results revealing Extensive hypermetabolic activity in the left neck, index lesion maximum SUV 20.3. Looking for a primary lesion in the neck, there is some very faint asymmetry of the left palatine tonsillar pillar which could conceivably represent a small lesion. 2. No significant abnormal activity in the chest, abdomen/pelvis, or skeleton. 3. Other imaging findings of potential clinical significance: Aortic Atherosclerosis (ICD10-I70.0). Coronary atherosclerosis. Small mucous retention cyst in left maxillary sinus. Diffuse hepatic steatosis. Nonspecific but probably benign hypodense lesions in the lateral segment left hepatic lobe.   Most recent lab results (01/24/18) of CBC w/diff, and CMP  is as follows: all values are WNL except glucose at 162. Magnesium 01/24/18 is WNL at 1.8 Phosphorus 01/24/18 is WNL at 2.7  On review of systems, pt reports left ear pain, left neck discomfort, left jaw pain, upper right leg rash, and denies abdominal pains, other skin rashes, and any other symptoms.   On PMHx the pt reports 1998 and 1999 back surgeries for ruptured disks with microdiscectomy, high blood pressure, denies diabetes, heart, lung or kidney problems. 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 Denies ever smoking cigarettes. Denies ETOH consumption in 30 years. Reports infrequent marijuana smoking 30 years ago.  On Family Hx the pt reports maternal cousin with brain cancer, maternal cousin with pancreatic cancer, maternal uncle with cancer. Denies much paternal knowledge.   Interval History:   Travis Mcdonald returns today regarding his tonsillar carcinoma. He is accompanied by his wife today.  The patient's last visit with [place] was on 02/12/18. The pt reports that he is doing well overall.   The pt reports that his throat is sore and he feels more tired. He notes that he has been using baking soda/salt  mouthwashes and has not wanted to use Lidocaine and Magic mouthwash. He denies any changes in hearing, and continues to have some tinnitus that comes and goes. He denies this tinnitus as being very bothersome or keeping him from sleeping.   The pt notes that he has continued to eat PO 100%, and is also consuming 2 boosts each day. As his throat pain is increasing, he is anticipating moving to PEG tube feeding and will be meeting with our nutritional therapist, Ernestene Kiel today. He notes that he has been able to move his bowels and states that he is drinking adequate fluids. He denies any problems passing urine.   Lab results today (02/26/18) of CBC w/diff, CMP, and Reticulocytes is as follows: all values are WNL except for Lymphs abs at 800, Glucose at 178, ALT at 45. Magnesium 02/26/18 is WNL at 1.9 Phosphorous 02/26/18 is WNL at 3.2  On review of systems, pt reports throat pain, reduced neck mass, intermittent mild tinnitus, moving his bowels, eating well, and denies problems passing urine, hearing changes, problems with the port, leakage at port site, and any other symptoms.   MEDICAL HISTORY:  Past Medical History:  Diagnosis Date  . Hypertension     SURGICAL HISTORY: Past Surgical History:  Procedure Laterality Date  . [place]   ruptured lower back disc surgery. L4 & L5 Lumber disc  . IR GASTROSTOMY TUBE MOD SED  02/02/2018  . IR IMAGING GUIDED PORT INSERTION  02/02/2018    SOCIAL HISTORY: Social History   Socioeconomic History  . Marital status: Married    Spouse name: Not on file  . Number of children: Not on file  . Years of education: Not on file  . Highest education level: Not on file  Occupational History  . Not on file  Social Needs  . Financial resource strain: Not on file  . Food insecurity:    Worry: Not on file    Inability: Not on file  . Transportation needs:    Medical: Not on file    Non-medical: Not on file  Tobacco Use  . Smoking  status: Never Smoker  . Smokeless tobacco: Never Used  Substance and Sexual Activity  . Alcohol use: No    Comment: no alcohol for 30 years.   . Drug use: No  . Sexual activity: Not on file  Lifestyle  . Physical activity:    Days per week: Not on file    Minutes per session: Not on file  . Stress: Not on file  Relationships  . Social connections:    Talks on phone: Not on file    Gets together: Not on file    Attends religious service: Not on file    Active member of club or organization: Not on file    Attends meetings of clubs or organizations: Not on file    Relationship status: Not on file  . Intimate partner violence:    Fear of current or ex partner: Not on file    Emotionally abused: Not on file    Physically abused: Not on file    Forced sexual activity: Not on file  Other Topics Concern  . Not on file  Social History Narrative  .  Not on file    FAMILY HISTORY: No family history on file.  ALLERGIES:  has No Known Allergies.  MEDICATIONS:  Current Outpatient Medications  Medication Sig Dispense Refill  . amoxicillin-clavulanate (AUGMENTIN) 875-125 MG tablet Take 1 tablet by mouth 2 (two) times daily. 14 tablet 0  . clotrimazole (LOTRIMIN AF) 1 % cream Apply 1 application topically 2 (two) times daily. To rash on rt leg 60 g 0  . dexamethasone (DECADRON) 4 MG tablet Take 2 tablets by mouth once a day on the day after chemotherapy and then take 2 tablets two times a day for 2 days. Take with food. 30 tablet 1  . enalapril-hydrochlorothiazide (VASERETIC) 10-25 MG tablet Take 1 tablet by mouth daily.     Marland Kitchen ibuprofen (ADVIL,MOTRIN) 200 MG tablet Take 200 mg by mouth every 6 (six) hours as needed.    . lidocaine (XYLOCAINE) 2 % solution Patient: Mix 1part 2% viscous lidocaine, 1part H20. Swish & swallow 14m of diluted mixture, 337m before meals and at bedtime, up to QID 100 mL 5  . lidocaine-prilocaine (EMLA) cream Apply to affected area once 30 g 3  . LORazepam  (ATIVAN) 1 MG tablet Take 1-2 tablets sublingually 30 min before radiotherapy. 40 tablet 0  . ondansetron (ZOFRAN) 8 MG tablet Take 1 tablet (8 mg total) by mouth 2 (two) times daily as needed. Start on the third day after chemotherapy. 30 tablet 1  . prochlorperazine (COMPAZINE) 10 MG tablet Take 1 tablet (10 mg total) by mouth every 6 (six) hours as needed (Nausea or vomiting). 30 tablet 1  . sodium fluoride (FLUORISHIELD) 1.1 % GEL dental gel Instill one drop of gel per tooth space of fluoride tray. Place over teeth for 5 minutes. Remove. Spit out excess. Repeat nightly. 120 mL prn   No current facility-administered medications for this visit.     REVIEW OF SYSTEMS:    A 10+ POINT REVIEW OF SYSTEMS WAS OBTAINED including neurology, dermatology, psychiatry, cardiac, respiratory, lymph, extremities, GI, GU, Musculoskeletal, constitutional, breasts, reproductive, HEENT.  All pertinent positives are noted in the HPI.  All others are negative.    PHYSICAL EXAMINATION: ECOG PERFORMANCE STATUS: 1 - Symptomatic but completely ambulatory  VS reviewed GENERAL:alert, in no acute distress and comfortable SKIN: rash on upper right leg, no significant lesions EYES: conjunctiva are pink and non-injected, sclera anicteric OROPHARYNX: MMM, no exudates, no oropharyngeal erythema or ulceration NECK: supple, no JVD LYMPH:  no palpable lymphadenopathy in the cervical, axillary or inguinal regions LUNGS: clear to auscultation b/l with normal respiratory effort, port a cath in situ HEART: regular rate & rhythm ABDOMEN:  normoactive bowel sounds , non tender, not distended, g tube in situ Extremity: no pedal edema PSYCH: alert & oriented x 3 with fluent speech NEURO: no focal motor/sensory deficits   LABORATORY DATA:  I have reviewed the data as listed  . CBC Latest Ref Rng & Units 02/26/2018 02/19/2018 02/12/2018  WBC 4.0 - 10.3 K/uL 5.5 7.5 8.9  Hemoglobin 13.0 - 17.1 g/dL 16.6 16.7 16.6  Hematocrit 38.4  - 49.9 % 47.8 55.2(H) 49.7  Platelets 140 - 400 K/uL 140 145 250    . CMP Latest Ref Rng & Units 02/26/2018 02/19/2018 02/12/2018  Glucose 70 - 99 mg/dL 178(H) 99 130(H)  BUN 6 - 20 mg/dL 19 17 15   Creatinine 0.61 - 1.24 mg/dL 1.07 0.82 0.95  Sodium 135 - 145 mmol/L 137 136 136  Potassium 3.5 - 5.1 mmol/L 3.8 4.0 3.8  Chloride 98 - 111 mmol/L 98 97(L) 101  CO2 22 - 32 mmol/L 28 30 27   Calcium 8.9 - 10.3 mg/dL 9.9 9.8 9.6  Total Protein 6.5 - 8.1 g/dL 7.1 7.1 7.0  Total Bilirubin 0.3 - 1.2 mg/dL 0.5 0.5 0.3  Alkaline Phos 38 - 126 U/L 86 83 88  AST 15 - 41 U/L 15 15 14(L)  ALT 0 - 44 U/L 45(H) 43 39   11/10/17 Comprehensive Hearing Test:    12/27/17 Surgical Pathology:    RADIOGRAPHIC STUDIES: I have personally reviewed the radiological images as listed and agreed with the findings in the report. Ir Gastrostomy Tube  Result Date: 02/02/2018 INDICATION: History of head neck cancer - please place percutaneous gastrostomy tube for enteric nutrition supplementation purposes during impending chemoradiation. EXAM: PULL TROUGH GASTROSTOMY TUBE PLACEMENT COMPARISON:  PET-CT - 01/04/2018 MEDICATIONS: Ancef 2 g IV; Antibiotics were administered within 1 hour of the procedure. Glucagon 1 mg IV CONTRAST:  25 cc Isovue 300 administered into the gastric lumen. ANESTHESIA/SEDATION: Moderate (conscious) sedation was employed during this procedure. A total of Versed 1 mg and Fentanyl 25 mcg was administered intravenously. Moderate Sedation Time: 12 minutes. The patient's level of consciousness and vital signs were monitored continuously by radiology nursing throughout the procedure under my direct supervision. FLUOROSCOPY TIME:  42 seconds (5 mGy) COMPLICATIONS: None immediate. PROCEDURE: Informed written consent was obtained from the patient following explanation of the procedure, risks, benefits and alternatives. A time out was performed prior to the initiation of the procedure. Ultrasound scanning was  performed to demarcate the edge of the left lobe of the liver. Maximal barrier sterile technique utilized including caps, mask, sterile gowns, sterile gloves, large sterile drape, hand hygiene and Betadine prep. The left upper quadrant was sterilely prepped and draped. An oral gastric catheter was inserted into the stomach under fluoroscopy. The existing nasogastric feeding tube was removed. The left costal margin and air opacified transverse colon were identified and avoided. Air was injected into the stomach for insufflation and visualization under fluoroscopy. Under sterile conditions a 17 gauge trocar needle was utilized to access the stomach percutaneously beneath the left subcostal margin after the overlying soft tissues were anesthetized with 1% Lidocaine with epinephrine. Needle position was confirmed within the stomach with aspiration of air and injection of small amount of contrast. A single T tack was deployed for gastropexy. Over an Amplatz guide wire, a 9-French sheath was inserted into the stomach. A snare device was utilized to capture the oral gastric catheter. The snare device was pulled retrograde from the stomach up the esophagus and out the oropharynx. The 20-French pull-through gastrostomy was connected to the snare device and pulled antegrade through the oropharynx down the esophagus into the stomach and then through the percutaneous tract external to the patient. The gastrostomy was assembled externally. Contrast injection confirms position in the stomach. Several spot radiographic images were obtained in various obliquities for documentation. The patient tolerated procedure well without immediate post procedural complication. FINDINGS: After successful fluoroscopic guided placement, the gastrostomy tube is appropriately positioned with internal disc against the ventral aspect of the gastric lumen. IMPRESSION: Successful fluoroscopic insertion of a 20-French pull-through gastrostomy tube. The  gastrostomy may be used immediately for medication administration and in 24 hrs for the initiation of feeds. Electronically Signed   By: Sandi Mariscal M.D.   On: 02/02/2018 16:25   Ir Imaging Guided Port Insertion  Result Date: 02/02/2018 INDICATION: History of head neck cancer, in need of  durable intravenous access for chemotherapy administration. EXAM: IMPLANTED PORT A CATH PLACEMENT WITH ULTRASOUND AND FLUOROSCOPIC GUIDANCE COMPARISON:  PET-CT - 01/04/2018; contrast-enhanced neck CT - 12/20/2017 MEDICATIONS: Ancef 2 gm IV; The antibiotic was administered within an appropriate time interval prior to skin puncture. ANESTHESIA/SEDATION: Moderate (conscious) sedation was employed during this procedure. A total of Versed 2.5 mg and Fentanyl 125 mcg was administered intravenously. Moderate Sedation Time: 29 minutes. The patient's level of consciousness and vital signs were monitored continuously by radiology nursing throughout the procedure under my direct supervision. CONTRAST:  None FLUOROSCOPY TIME:  36 seconds (5 mGy) COMPLICATIONS: None immediate. PROCEDURE: The procedure, risks, benefits, and alternatives were explained to the patient. Questions regarding the procedure were encouraged and answered. The patient understands and consents to the procedure. The right neck and chest were prepped with chlorhexidine in a sterile fashion, and a sterile drape was applied covering the operative field. Maximum barrier sterile technique with sterile gowns and gloves were used for the procedure. A timeout was performed prior to the initiation of the procedure. Local anesthesia was provided with 1% lidocaine with epinephrine. After creating a small venotomy incision, a micropuncture kit was utilized to access the internal jugular vein. Real-time ultrasound guidance was utilized for vascular access including the acquisition of a permanent ultrasound image documenting patency of the accessed vessel. The microwire was utilized  to measure appropriate catheter length. A subcutaneous port pocket was then created along the upper chest wall utilizing a combination of sharp and blunt dissection. The pocket was irrigated with sterile saline. A single lumen thin power injectable port was chosen for placement. The 8 Fr catheter was tunneled from the port pocket site to the venotomy incision. The port was placed in the pocket. The external catheter was trimmed to appropriate length. At the venotomy, an 8 Fr peel-away sheath was placed over a guidewire under fluoroscopic guidance. The catheter was then placed through the sheath and the sheath was removed. Final catheter positioning was confirmed and documented with a fluoroscopic spot radiograph. The port was accessed with a Huber needle, aspirated and flushed with heparinized saline. The venotomy site was closed with an interrupted 4-0 Vicryl suture. The port pocket incision was closed with interrupted 2-0 Vicryl suture and the skin was opposed with a running subcuticular 4-0 Vicryl suture. Dermabond and Steri-strips were applied to both incisions. Dressings were placed. The patient tolerated the procedure well without immediate post procedural complication. FINDINGS: After catheter placement, the tip lies within the superior cavoatrial junction. The catheter aspirates and flushes normally and is ready for immediate use. IMPRESSION: Successful placement of a right internal jugular approach power injectable Port-A-Cath. The catheter is ready for immediate use. Electronically Signed   By: Sandi Mariscal M.D.   On: 02/02/2018 16:23    ASSESSMENT & PLAN:   52 y.o. 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 Electrolytes are WNL.  -The pt has no prohibitive toxicities from continuing Cisplatin 80m/m2 each week at this time.   -Continue follow up with our nutritional therapist, BErnestene Kieltoday -Continue salt/baking soda  mouth washes 4-5 times a day   2. Eczematoid skin rash on his lower extremities RT>left. Resolved with anti fungal - fluconazole. Plan -completed po fluconazole with near resolution of rash -conitnue topical anti-fungals . -Continue moisturizing previously fungal-affected skin areas on bilateral LE, use colloidal silver soap for anti-fungal prevention    Continue labs and weekly Cisplatin as ordered RTC with Dr Irene Limbo in 2 weeks     All of the patients questions were answered with apparent satisfaction. The patient knows to call the clinic with any problems, questions or concerns.  The total time spent in the appt was 25 minutes and more than 50% was on counseling and direct patient cares.    Sullivan Lone MD MS AAHIVMS [facility] Hematology/Oncology Physician [facility]  (Office):       [phone] (Work cell):  [phone] (Fax):           [phone]  02/26/2018 9:44 AM  I, Baldwin Jamaica, am acting as a Education administrator for Dr Irene Limbo.   .I have reviewed the above documentation for accuracy and completeness, and I agree with the above. Brunetta Genera MD

## 2018-02-26 ENCOUNTER — Inpatient Hospital Stay (HOSPITAL_BASED_OUTPATIENT_CLINIC_OR_DEPARTMENT_OTHER): Payer: PRIVATE HEALTH INSURANCE | Admitting: Hematology

## 2018-02-26 ENCOUNTER — Inpatient Hospital Stay: Payer: PRIVATE HEALTH INSURANCE | Admitting: Nutrition

## 2018-02-26 ENCOUNTER — Ambulatory Visit
Admission: RE | Admit: 2018-02-26 | Discharge: 2018-02-26 | Disposition: A | Discharge status: Home / Self Care | Payer: PRIVATE HEALTH INSURANCE | Source: Ambulatory Visit | Attending: Radiation Oncology | Admitting: Radiation Oncology

## 2018-02-26 ENCOUNTER — Inpatient Hospital Stay: Payer: PRIVATE HEALTH INSURANCE

## 2018-02-26 ENCOUNTER — Ambulatory Visit: Payer: PRIVATE HEALTH INSURANCE | Attending: Internal Medicine

## 2018-02-26 ENCOUNTER — Ambulatory Visit: Payer: Self-pay

## 2018-02-26 VITALS — BP 99/81 | HR 94 | Temp 97.7°F | Resp 16 | Ht 74.0 in | Wt 255.0 lb

## 2018-02-26 DIAGNOSIS — IMO0002 Reserved for concepts with insufficient information to code with codable children: Secondary | ICD-10-CM

## 2018-02-26 DIAGNOSIS — C799 Secondary malignant neoplasm of unspecified site: Secondary | ICD-10-CM

## 2018-02-26 DIAGNOSIS — Z510 Encounter for antineoplastic radiation therapy: Secondary | ICD-10-CM | POA: Diagnosis not present

## 2018-02-26 DIAGNOSIS — Z79899 Other long term (current) drug therapy: Secondary | ICD-10-CM

## 2018-02-26 DIAGNOSIS — K5900 Constipation, unspecified: Secondary | ICD-10-CM | POA: Diagnosis not present

## 2018-02-26 DIAGNOSIS — C090 Malignant neoplasm of tonsillar fossa: Secondary | ICD-10-CM

## 2018-02-26 DIAGNOSIS — Z7189 Other specified counseling: Secondary | ICD-10-CM

## 2018-02-26 DIAGNOSIS — R21 Rash and other nonspecific skin eruption: Secondary | ICD-10-CM

## 2018-02-26 DIAGNOSIS — Z5111 Encounter for antineoplastic chemotherapy: Secondary | ICD-10-CM | POA: Diagnosis not present

## 2018-02-26 DIAGNOSIS — J069 Acute upper respiratory infection, unspecified: Secondary | ICD-10-CM | POA: Diagnosis not present

## 2018-02-26 DIAGNOSIS — Z95828 Presence of other vascular implants and grafts: Secondary | ICD-10-CM

## 2018-02-26 DIAGNOSIS — C099 Malignant neoplasm of tonsil, unspecified: Secondary | ICD-10-CM | POA: Diagnosis not present

## 2018-02-26 DIAGNOSIS — B49 Unspecified mycosis: Secondary | ICD-10-CM

## 2018-02-26 DIAGNOSIS — Z931 Gastrostomy status: Secondary | ICD-10-CM

## 2018-02-26 LAB — CMP (CANCER CENTER ONLY)
ALBUMIN: 3.9 g/dL (ref 3.5–5.0)
ALK PHOS: 86 U/L (ref 38–126)
ALT: 45 U/L — ABNORMAL HIGH (ref 0–44)
ANION GAP: 11 (ref 5–15)
AST: 15 U/L (ref 15–41)
BILIRUBIN TOTAL: 0.5 mg/dL (ref 0.3–1.2)
BUN: 19 mg/dL (ref 6–20)
CALCIUM: 9.9 mg/dL (ref 8.9–10.3)
CO2: 28 mmol/L (ref 22–32)
Chloride: 98 mmol/L (ref 98–111)
Creatinine: 1.07 mg/dL (ref 0.61–1.24)
GFR, Est AFR Am: >60 mL/min (ref >60)
GFR, Estimated: >60 mL/min (ref >60)
GLUCOSE: 178 mg/dL — AB (ref 70–99)
Potassium: 3.8 mmol/L (ref 3.5–5.1)
SODIUM: 137 mmol/L (ref 135–145)
Total Protein: 7.1 g/dL (ref 6.5–8.1)

## 2018-02-26 LAB — CBC WITH DIFFERENTIAL/PLATELET
Basophils Absolute: 0 10*3/uL (ref 0.0–0.1)
Basophils Relative: 0 %
EOS PCT: 2 %
Eosinophils Absolute: 0.1 10*3/uL (ref 0.0–0.5)
HCT: 47.8 % (ref 38.4–49.9)
Hemoglobin: 16.6 g/dL (ref 13.0–17.1)
Lymphocytes Relative: 14 %
Lymphs Abs: 0.8 10*3/uL — ABNORMAL LOW (ref 0.9–3.3)
MCH: 29 pg (ref 27.2–33.4)
MCHC: 34.7 g/dL (ref 32.0–36.0)
MCV: 83.6 fL (ref 79.3–98.0)
MONO ABS: 0.5 10*3/uL (ref 0.1–0.9)
MONOS PCT: 8 %
NEUTROS ABS: 4.2 10*3/uL (ref 1.5–6.5)
Neutrophils Relative %: 76 %
PLATELETS: 140 10*3/uL (ref 140–400)
RBC: 5.72 MIL/uL (ref 4.20–5.82)
RDW: 13 % (ref 11.0–14.6)
WBC: 5.5 10*3/uL (ref 4.0–10.3)

## 2018-02-26 LAB — MAGNESIUM: Magnesium: 1.9 mg/dL (ref 1.7–2.4)

## 2018-02-26 LAB — PHOSPHORUS: Phosphorus: 3.2 mg/dL (ref 2.5–4.6)

## 2018-02-26 MED ORDER — PALONOSETRON HCL INJECTION 0.25 MG/5ML
INTRAVENOUS | Status: AC
  Filled 2018-02-26: qty 5

## 2018-02-26 MED ORDER — SODIUM CHLORIDE 0.9% FLUSH
10.0000 mL | INTRAVENOUS | Status: DC | PRN
  Administered 2018-02-26: 10 mL
  Filled 2018-02-26: qty 10

## 2018-02-26 MED ORDER — PALONOSETRON HCL INJECTION 0.25 MG/5ML
0.2500 mg | Freq: Once | INTRAVENOUS | Status: AC
  Administered 2018-02-26: 0.25 mg via INTRAVENOUS

## 2018-02-26 MED ORDER — MANNITOL 25 % IV SOLN
Freq: Once | INTRAVENOUS | Status: AC
  Administered 2018-02-26: 11:00:00 via INTRAVENOUS
  Filled 2018-02-26: qty 10

## 2018-02-26 MED ORDER — SODIUM CHLORIDE 0.9 % IV SOLN
Freq: Once | INTRAVENOUS | Status: AC
  Administered 2018-02-26: 13:00:00 via INTRAVENOUS
  Filled 2018-02-26: qty 5

## 2018-02-26 MED ORDER — SODIUM CHLORIDE 0.9 % IV SOLN
100.0000 mg | Freq: Once | INTRAVENOUS | Status: AC
  Administered 2018-02-26: 100 mg via INTRAVENOUS
  Filled 2018-02-26: qty 100

## 2018-02-26 MED ORDER — SODIUM CHLORIDE 0.9 % IV SOLN
Freq: Once | INTRAVENOUS | Status: AC
  Administered 2018-02-26: 10:00:00 via INTRAVENOUS

## 2018-02-26 MED ORDER — HEPARIN SOD (PORK) LOCK FLUSH 100 UNIT/ML IV SOLN
500.0000 [IU] | Freq: Once | INTRAVENOUS | Status: AC | PRN
  Administered 2018-02-26: 500 [IU]
  Filled 2018-02-26: qty 5

## 2018-02-26 NOTE — Patient Instructions (Signed)
[facility] Discharge Instructions for Patients Receiving Chemotherapy  Today you received the following chemotherapy agents: Cisplatin  To help prevent nausea and vomiting after your treatment, we encourage you to take your nausea medication  as prescribed.    If you develop nausea and vomiting that is not controlled by your nausea medication, call the clinic.   BELOW ARE SYMPTOMS THAT SHOULD BE REPORTED IMMEDIATELY:  *FEVER GREATER THAN 100.5 F  *CHILLS WITH OR WITHOUT FEVER  NAUSEA AND VOMITING THAT IS NOT CONTROLLED WITH YOUR NAUSEA MEDICATION  *UNUSUAL SHORTNESS OF BREATH  *UNUSUAL BRUISING OR BLEEDING  TENDERNESS IN MOUTH AND THROAT WITH OR WITHOUT PRESENCE OF ULCERS  *URINARY PROBLEMS  *BOWEL PROBLEMS  UNUSUAL RASH Items with * indicate a potential emergency and should be followed up as soon as possible.  Feel free to call the clinic should you have any questions or concerns. The clinic phone number is [phone].  Please show the CHEMO ALERT CARD at check-in to the Emergency Department and triage nurse.   

## 2018-02-26 NOTE — Progress Notes (Signed)
Nutrition follow-up completed with patient and his wife during infusion for P 16+ tonsil cancer. Weight decreased and documented as 255 pounds on July 15 decreased from 268 pounds July 8. Patient reports he cannot eat most foods currently but tolerates liquids if he drinks them on the side of his mouth. He is consuming applesauce and pudding. He has recently increased boost plus twice daily. Patient complains of taste alterations and he is beginning to get thick saliva. Noted glucose 178.  Estimated nutrition needs: 2400-2600 cal, 135-150 g protein, 2.7 L fluid.  Nutrition diagnosis: Inadequate oral intake continues.  Intervention: Patient would like to increase oral nutrition supplements rather than beginning tube feeding. I explained patient will require 7 bottles of Ensure Enlive or boost plus to meet minimum estimated nutrition needs. Patient will need to continue drinking at least 40 ounces of water daily along with regular PEG flushes. Patient is agreeable to increasing oral nutrition supplements. Educated him to drink 1-1/2 bottles 4 times daily +1 bottle once daily to provide 2480 cal and 122 g protein.(This is a combination of 4 Ensure Enlive and 3 boost plus per patient preference.) Provided one complimentary case of Ensure Enlive and coupons. Questions answered and teach back method used.  Monitoring, evaluation, goals: Patient will tolerate increased oral nutrition supplements to meet greater than 90% estimated nutrition needs.  Next visit: Tuesday, July 23 during infusion.  **Disclaimer: This note was dictated with voice recognition software. Similar sounding words can inadvertently be transcribed and this note may contain transcription errors which may not have been corrected upon publication of note.**

## 2018-02-27 ENCOUNTER — Ambulatory Visit
Admission: RE | Admit: 2018-02-27 | Discharge: 2018-02-27 | Disposition: A | Discharge status: Home / Self Care | Payer: PRIVATE HEALTH INSURANCE | Source: Ambulatory Visit | Attending: Radiation Oncology | Admitting: Radiation Oncology

## 2018-02-27 ENCOUNTER — Telehealth: Payer: Self-pay

## 2018-02-27 DIAGNOSIS — Z510 Encounter for antineoplastic radiation therapy: Secondary | ICD-10-CM | POA: Diagnosis not present

## 2018-02-27 NOTE — Telephone Encounter (Signed)
Per 7/15 no los °

## 2018-02-28 ENCOUNTER — Telehealth: Payer: Self-pay | Admitting: *Deleted

## 2018-02-28 ENCOUNTER — Ambulatory Visit
Admission: RE | Admit: 2018-02-28 | Discharge: 2018-02-28 | Disposition: A | Discharge status: Home / Self Care | Payer: PRIVATE HEALTH INSURANCE | Source: Ambulatory Visit | Attending: Radiation Oncology | Admitting: Radiation Oncology

## 2018-02-28 DIAGNOSIS — Z510 Encounter for antineoplastic radiation therapy: Secondary | ICD-10-CM | POA: Diagnosis not present

## 2018-02-28 NOTE — Telephone Encounter (Signed)
Oncology Nurse Navigator Documentation  Spoke with Joycie Peek ENT Audiology, re status of next audiogram.  She indicated next test scheduled for 8/1 9:30.  I requested reschedule prior to 7/23 cisplatin infusion.  She acknowledged.  [name], RN, BSN Head & Neck Oncology Nurse [place] at [place] [phone]

## 2018-03-01 ENCOUNTER — Ambulatory Visit
Admission: RE | Admit: 2018-03-01 | Discharge: 2018-03-01 | Disposition: A | Discharge status: Home / Self Care | Payer: PRIVATE HEALTH INSURANCE | Source: Ambulatory Visit | Attending: Radiation Oncology | Admitting: Radiation Oncology

## 2018-03-01 DIAGNOSIS — Z510 Encounter for antineoplastic radiation therapy: Secondary | ICD-10-CM | POA: Diagnosis not present

## 2018-03-02 ENCOUNTER — Ambulatory Visit
Admission: RE | Admit: 2018-03-02 | Discharge: 2018-03-02 | Disposition: A | Discharge status: Home / Self Care | Payer: PRIVATE HEALTH INSURANCE | Source: Ambulatory Visit | Attending: Radiation Oncology | Admitting: Radiation Oncology

## 2018-03-02 DIAGNOSIS — Z510 Encounter for antineoplastic radiation therapy: Secondary | ICD-10-CM | POA: Diagnosis not present

## 2018-03-05 ENCOUNTER — Ambulatory Visit
Admission: RE | Admit: 2018-03-05 | Discharge: 2018-03-05 | Disposition: A | Discharge status: Home / Self Care | Payer: PRIVATE HEALTH INSURANCE | Source: Ambulatory Visit | Attending: Radiation Oncology | Admitting: Radiation Oncology

## 2018-03-05 ENCOUNTER — Other Ambulatory Visit: Payer: Self-pay | Admitting: *Deleted

## 2018-03-05 ENCOUNTER — Other Ambulatory Visit: Payer: Self-pay | Admitting: Radiation Oncology

## 2018-03-05 DIAGNOSIS — Z510 Encounter for antineoplastic radiation therapy: Secondary | ICD-10-CM | POA: Diagnosis not present

## 2018-03-05 DIAGNOSIS — IMO0002 Reserved for concepts with insufficient information to code with codable children: Secondary | ICD-10-CM

## 2018-03-05 DIAGNOSIS — C799 Secondary malignant neoplasm of unspecified site: Secondary | ICD-10-CM

## 2018-03-06 ENCOUNTER — Other Ambulatory Visit: Payer: Self-pay

## 2018-03-06 ENCOUNTER — Ambulatory Visit: Payer: Self-pay | Admitting: Hematology

## 2018-03-06 ENCOUNTER — Inpatient Hospital Stay: Payer: PRIVATE HEALTH INSURANCE

## 2018-03-06 ENCOUNTER — Ambulatory Visit: Payer: Self-pay

## 2018-03-06 ENCOUNTER — Encounter: Payer: Self-pay | Admitting: *Deleted

## 2018-03-06 ENCOUNTER — Other Ambulatory Visit: Payer: Self-pay | Admitting: Hematology

## 2018-03-06 ENCOUNTER — Inpatient Hospital Stay: Payer: PRIVATE HEALTH INSURANCE | Admitting: Nutrition

## 2018-03-06 ENCOUNTER — Ambulatory Visit
Admission: RE | Admit: 2018-03-06 | Discharge: 2018-03-06 | Disposition: A | Discharge status: Home / Self Care | Payer: PRIVATE HEALTH INSURANCE | Source: Ambulatory Visit | Attending: Radiation Oncology | Admitting: Radiation Oncology

## 2018-03-06 VITALS — BP 125/91 | HR 95 | Temp 98.6°F | Wt 251.0 lb

## 2018-03-06 DIAGNOSIS — IMO0002 Reserved for concepts with insufficient information to code with codable children: Secondary | ICD-10-CM

## 2018-03-06 DIAGNOSIS — Z510 Encounter for antineoplastic radiation therapy: Secondary | ICD-10-CM | POA: Diagnosis not present

## 2018-03-06 DIAGNOSIS — Z5111 Encounter for antineoplastic chemotherapy: Secondary | ICD-10-CM | POA: Diagnosis not present

## 2018-03-06 DIAGNOSIS — C799 Secondary malignant neoplasm of unspecified site: Secondary | ICD-10-CM

## 2018-03-06 DIAGNOSIS — C090 Malignant neoplasm of tonsillar fossa: Secondary | ICD-10-CM

## 2018-03-06 DIAGNOSIS — Z7189 Other specified counseling: Secondary | ICD-10-CM

## 2018-03-06 LAB — CBC WITH DIFFERENTIAL/PLATELET
Basophils Absolute: 0 10*3/uL (ref 0.0–0.1)
Basophils Relative: 1 %
EOS PCT: 3 %
Eosinophils Absolute: 0.1 10*3/uL (ref 0.0–0.5)
HCT: 43 % (ref 38.4–49.9)
HEMOGLOBIN: 14.7 g/dL (ref 13.0–17.1)
LYMPHS ABS: 0.6 10*3/uL — AB (ref 0.9–3.3)
LYMPHS PCT: 16 %
MCH: 28.3 pg (ref 27.2–33.4)
MCHC: 34.1 g/dL (ref 32.0–36.0)
MCV: 83 fL (ref 79.3–98.0)
MONOS PCT: 12 %
Monocytes Absolute: 0.4 10*3/uL (ref 0.1–0.9)
Neutro Abs: 2.6 10*3/uL (ref 1.5–6.5)
Neutrophils Relative %: 68 %
Platelets: 150 10*3/uL (ref 140–400)
RBC: 5.18 MIL/uL (ref 4.20–5.82)
RDW: 13.2 % (ref 11.0–14.6)
WBC: 3.8 10*3/uL — AB (ref 4.0–10.3)

## 2018-03-06 LAB — CMP (CANCER CENTER ONLY)
ALK PHOS: 85 U/L (ref 38–126)
ALT: 39 U/L (ref 0–44)
ANION GAP: 12 (ref 5–15)
AST: 19 U/L (ref 15–41)
Albumin: 3.8 g/dL (ref 3.5–5.0)
BUN: 23 mg/dL — ABNORMAL HIGH (ref 6–20)
CALCIUM: 9.6 mg/dL (ref 8.9–10.3)
CO2: 27 mmol/L (ref 22–32)
CREATININE: 1.12 mg/dL (ref 0.61–1.24)
Chloride: 99 mmol/L (ref 98–111)
Glucose, Bld: 139 mg/dL — ABNORMAL HIGH (ref 70–99)
Potassium: 3.9 mmol/L (ref 3.5–5.1)
Sodium: 138 mmol/L (ref 135–145)
TOTAL PROTEIN: 6.8 g/dL (ref 6.5–8.1)
Total Bilirubin: 0.4 mg/dL (ref 0.3–1.2)

## 2018-03-06 LAB — PHOSPHORUS: PHOSPHORUS: 3.6 mg/dL (ref 2.5–4.6)

## 2018-03-06 LAB — MAGNESIUM: MAGNESIUM: 1.9 mg/dL (ref 1.7–2.4)

## 2018-03-06 MED ORDER — HEPARIN SOD (PORK) LOCK FLUSH 100 UNIT/ML IV SOLN
500.0000 [IU] | Freq: Once | INTRAVENOUS | Status: AC | PRN
  Administered 2018-03-06: 500 [IU]
  Filled 2018-03-06: qty 5

## 2018-03-06 MED ORDER — SODIUM CHLORIDE 0.9% FLUSH
10.0000 mL | INTRAVENOUS | Status: DC | PRN
  Administered 2018-03-06: 10 mL
  Filled 2018-03-06: qty 10

## 2018-03-06 MED ORDER — PALONOSETRON HCL INJECTION 0.25 MG/5ML
0.2500 mg | Freq: Once | INTRAVENOUS | Status: AC
  Administered 2018-03-06: 0.25 mg via INTRAVENOUS

## 2018-03-06 MED ORDER — OSMOLITE 1.5 CAL PO LIQD: ORAL | 0 refills | Status: DC

## 2018-03-06 MED ORDER — CISPLATIN CHEMO INJECTION 100MG/100ML
39.2000 mg/m2 | Freq: Once | INTRAVENOUS | Status: AC
  Administered 2018-03-06: 100 mg via INTRAVENOUS
  Filled 2018-03-06: qty 100

## 2018-03-06 MED ORDER — SODIUM CHLORIDE 0.9 % IV SOLN
39.2000 mg/m2 | Freq: Once | INTRAVENOUS | Status: DC
  Filled 2018-03-06: qty 100

## 2018-03-06 MED ORDER — PALONOSETRON HCL INJECTION 0.25 MG/5ML
INTRAVENOUS | Status: AC
  Filled 2018-03-06: qty 5

## 2018-03-06 MED ORDER — POTASSIUM CHLORIDE 2 MEQ/ML IV SOLN
Freq: Once | INTRAVENOUS | Status: AC
  Administered 2018-03-06: 10:00:00 via INTRAVENOUS
  Filled 2018-03-06: qty 10

## 2018-03-06 MED ORDER — SODIUM CHLORIDE 0.9 % IJ SOLN
10.0000 mL | Freq: Once | INTRAMUSCULAR | Status: AC
  Administered 2018-03-06: 10 mL
  Filled 2018-03-06: qty 10

## 2018-03-06 MED ORDER — SODIUM CHLORIDE 0.9 % IV SOLN
INTRAVENOUS | Status: AC
  Administered 2018-03-06: 12:00:00 via INTRAVENOUS

## 2018-03-06 MED ORDER — SODIUM CHLORIDE 0.9 % IV SOLN
Freq: Once | INTRAVENOUS | Status: AC
  Administered 2018-03-06: 13:00:00 via INTRAVENOUS
  Filled 2018-03-06: qty 5

## 2018-03-06 NOTE — Progress Notes (Signed)
Oncology Nurse Navigator Documentation  Met with Mr. Mcwherter in Infusion to check on his well-being. He reported increasing throat pain, thickened saliva, fatigue, acknowledged understanding these are expected RT SEs. He indicated he is not working at this time. He stated he has not received call for repeat audiogram.  I noted I will follow up. He denied needs/concerns, voiced understanding I can be called.  [name], RN, BSN Head & Neck Oncology Nurse [place] at [place] [phone]

## 2018-03-06 NOTE — Progress Notes (Signed)
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 Weight decreased and documented as 251 pounds down from 255 pounds July 15. Patient reports he was drinking Ensure plus with good success until it came to a "screeching halt." He is tolerating a small amount of water by mouth. He is not able to eat any other foods or liquids. Labs reviewed: Glucose 139, BUN 23. Patient is agreeable to beginning tube feeding via PEG.  Estimated nutrition needs: 2400-2600 cal, 135-150 g protein, 2.7 L fluid.  Nutrition diagnosis: Inadequate oral intake continues.  Intervention: Patient educated to begin 1 bottle Osmolite 1.5, 4 times daily via PEG with 120 mL free water before and after bolus feeding. Patient to increase to 1-1/2 bottles Osmolite 1.5, 4 times daily on Thursday this week as tolerated. He will continue to increase to goal rate of 2 bottles twice daily and 1-1/2 bottles twice daily on Saturday.   He will also provide an additional 240 cc water 3 times daily. Tube feeding schedule was written out for patient.  Teach back method was used. Orders were written and advanced home care was notified.  7 bottles of Osmolite 1.5+ free water flushes will provide 2485 cal, 104 g protein, 2947 mL free water.  Monitoring, evaluation, goals: Patient will tolerate tube feeding advancement to goal rate. Will follow-up and add protein supplement as tolerated.  Next visit: Wednesday, July 31 during infusion.  Patient's wife knows to contact me with questions or concerns.  **Disclaimer: This note was dictated with voice recognition software. Similar sounding words can inadvertently be transcribed and this note may contain transcription errors which may not have been corrected upon publication of note.**

## 2018-03-06 NOTE — Patient Instructions (Signed)
[facility] Discharge Instructions for Patients Receiving Chemotherapy  Today you received the following chemotherapy agents cisplatin  To help prevent nausea and vomiting after your treatment, we encourage you to take your nausea medication. As directed.   If you develop nausea and vomiting that is not controlled by your nausea medication, call the clinic.   BELOW ARE SYMPTOMS THAT SHOULD BE REPORTED IMMEDIATELY:  *FEVER GREATER THAN 100.5 F  *CHILLS WITH OR WITHOUT FEVER  NAUSEA AND VOMITING THAT IS NOT CONTROLLED WITH YOUR NAUSEA MEDICATION  *UNUSUAL SHORTNESS OF BREATH  *UNUSUAL BRUISING OR BLEEDING  TENDERNESS IN MOUTH AND THROAT WITH OR WITHOUT PRESENCE OF ULCERS  *URINARY PROBLEMS  *BOWEL PROBLEMS  UNUSUAL RASH Items with * indicate a potential emergency and should be followed up as soon as possible.  Feel free to call the clinic should you have any questions or concerns. The clinic phone number is [phone].  Please show the [place] at check-in to the Emergency Department and triage nurse.

## 2018-03-07 ENCOUNTER — Ambulatory Visit
Admission: RE | Admit: 2018-03-07 | Discharge: 2018-03-07 | Disposition: A | Discharge status: Home / Self Care | Payer: PRIVATE HEALTH INSURANCE | Source: Ambulatory Visit | Attending: Radiation Oncology | Admitting: Radiation Oncology

## 2018-03-07 DIAGNOSIS — Z510 Encounter for antineoplastic radiation therapy: Secondary | ICD-10-CM | POA: Diagnosis not present

## 2018-03-08 ENCOUNTER — Encounter: Payer: Self-pay | Admitting: *Deleted

## 2018-03-08 ENCOUNTER — Ambulatory Visit
Admission: RE | Admit: 2018-03-08 | Discharge: 2018-03-08 | Disposition: A | Discharge status: Home / Self Care | Payer: PRIVATE HEALTH INSURANCE | Source: Ambulatory Visit | Attending: Radiation Oncology | Admitting: Radiation Oncology

## 2018-03-08 DIAGNOSIS — Z510 Encounter for antineoplastic radiation therapy: Secondary | ICD-10-CM | POA: Diagnosis not present

## 2018-03-08 NOTE — Progress Notes (Signed)
Oncology Nurse Navigator Documentation  Delivered 'Attending 62 Statement' form received from patient yesterday afternoon to [facility] for processing.  [name], RN, BSN Head & Neck Oncology Nurse [place] at [place] [phone]

## 2018-03-09 ENCOUNTER — Ambulatory Visit
Admission: RE | Admit: 2018-03-09 | Discharge: 2018-03-09 | Disposition: A | Discharge status: Home / Self Care | Payer: PRIVATE HEALTH INSURANCE | Source: Ambulatory Visit | Attending: Radiation Oncology | Admitting: Radiation Oncology

## 2018-03-09 ENCOUNTER — Other Ambulatory Visit: Payer: Self-pay | Admitting: Radiation Oncology

## 2018-03-09 DIAGNOSIS — Z510 Encounter for antineoplastic radiation therapy: Secondary | ICD-10-CM | POA: Diagnosis not present

## 2018-03-09 DIAGNOSIS — C090 Malignant neoplasm of tonsillar fossa: Secondary | ICD-10-CM

## 2018-03-09 MED ORDER — LORAZEPAM 1 MG PO TABS: ORAL_TABLET | ORAL | 0 refills | Status: DC

## 2018-03-12 ENCOUNTER — Other Ambulatory Visit: Payer: Self-pay | Admitting: Radiation Oncology

## 2018-03-12 ENCOUNTER — Telehealth: Payer: Self-pay | Admitting: *Deleted

## 2018-03-12 ENCOUNTER — Other Ambulatory Visit: Payer: Self-pay | Admitting: Hematology

## 2018-03-12 ENCOUNTER — Ambulatory Visit
Admission: RE | Admit: 2018-03-12 | Discharge: 2018-03-12 | Disposition: A | Discharge status: Home / Self Care | Payer: PRIVATE HEALTH INSURANCE | Source: Ambulatory Visit | Attending: Radiation Oncology | Admitting: Radiation Oncology

## 2018-03-12 DIAGNOSIS — Z510 Encounter for antineoplastic radiation therapy: Secondary | ICD-10-CM | POA: Diagnosis not present

## 2018-03-12 MED ORDER — SENNOSIDES-DOCUSATE SODIUM 8.6-50 MG PO TABS: 2.0000 | ORAL_TABLET | Freq: Every evening | ORAL | 1 refills | Status: DC | PRN

## 2018-03-12 MED ORDER — OXYCODONE HCL 5 MG PO TABS: 5.0000 mg | ORAL_TABLET | ORAL | 0 refills | Status: DC | PRN

## 2018-03-12 NOTE — Telephone Encounter (Signed)
Oncology Nurse Navigator Documentation  Rec'd call from pt's wife.  She reported he has not rec'd shipment of Osmolite 1.5 from [facility], has but 2 boxes.  I notified Dory Peru, RD, who had place order 7/23.  She contacted Edwinna Areola, [facility], to expedite delivery for today, I delivered 7 boxes to [facility] # for his receipt this afternoon.  She asked about status of Physician's Statement delivered last week.  I spoke with HIM Ivin Poot who indicated will be completed this afternoon and brought to me for further processing s/p Dr. Pearlie Oyster review/signature. Returned call to wife re supplement, unable to LVMM re Disability.  [name], RN, BSN Head & Neck Oncology Nurse [place] at [place] [phone]

## 2018-03-13 ENCOUNTER — Ambulatory Visit
Admission: RE | Admit: 2018-03-13 | Discharge: 2018-03-13 | Disposition: A | Discharge status: Home / Self Care | Payer: PRIVATE HEALTH INSURANCE | Source: Ambulatory Visit | Attending: Radiation Oncology | Admitting: Radiation Oncology

## 2018-03-13 DIAGNOSIS — Z510 Encounter for antineoplastic radiation therapy: Secondary | ICD-10-CM | POA: Diagnosis not present

## 2018-03-13 NOTE — Progress Notes (Signed)
HEMATOLOGY/ONCOLOGY CLINIC NOTE  Date of Service: 03/14/2018  Patient Care Team: Celene Squibb, MD as PCP - General (Internal Medicine) Rozetta Nunnery, MD as Consulting Physician (Otolaryngology) Eppie Gibson, MD as Attending Physician (Radiation Oncology) Brunetta Genera, MD as Consulting Physician (Hematology) Leota Sauers, RN as Oncology Nurse Navigator Karie Mainland, RD as Dietitian (Nutrition) Valentino Saxon [place], CCC-SLP as Speech Language Pathologist (Speech Pathology) Wynelle Beckmann, Melodie Bouillon, PT as Physical Therapist (Physical Therapy) Kennith Center, LCSW as Social Worker  CHIEF COMPLAINTS/PURPOSE OF CONSULTATION:  F/u for Tonsillar carcinoma   HISTORY OF PRESENTING ILLNESS:   Travis Mcdonald is a wonderful 52 y.o. 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 He is accompanied today by his wife. The pt reports that he is doing well overall.   The pt reports first noticing an enlargement of his left neck around January 2019. He believed these to be sinus related since he took sinus medications which decreased the swelling. He notes aggravating pain but denies significant pain or problems swallowing. He reports left ear and jaw pain as well.   Four teeth extraction on 01/19/18 with radiation planning this afternoon and tentative radiation beginning 02/05/18.   He denies any ear infection concerns after his audiogram. He denies recurrent ear infections but notes that he had two ear infections last year.    The pt notes that he will try to continue work each day.  He adds that he has a skin rash on his right upper leg which is recurrent in the summers. He denies seeing a dermatologist in the past and treats the associated itching with cortisone and benadryl.   He notes that he does snore at night and his wife notes that he does not snore when he sleeps on his side.   Of note prior to the patient's  visit today, pt has had PET/CT completed on 01/04/18 with results revealing Extensive hypermetabolic activity in the left neck, index lesion maximum SUV 20.3. Looking for a primary lesion in the neck, there is some very faint asymmetry of the left palatine tonsillar pillar which could conceivably represent a small lesion. 2. No significant abnormal activity in the chest, abdomen/pelvis, or skeleton. 3. Other imaging findings of potential clinical significance: Aortic Atherosclerosis (ICD10-I70.0). Coronary atherosclerosis. Small mucous retention cyst in left maxillary sinus. Diffuse hepatic steatosis. Nonspecific but probably benign hypodense lesions in the lateral segment left hepatic lobe.   Most recent lab results (01/24/18) of CBC w/diff, and CMP  is as follows: all values are WNL except glucose at 162. Magnesium 01/24/18 is WNL at 1.8 Phosphorus 01/24/18 is WNL at 2.7  On review of systems, pt reports left ear pain, left neck discomfort, left jaw pain, upper right leg rash, and denies abdominal pains, other skin rashes, and any other symptoms.   On PMHx the pt reports 1998 and 1999 back surgeries for ruptured disks with microdiscectomy, high blood pressure, denies diabetes, heart, lung or kidney problems. 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 Denies ever smoking cigarettes. Denies ETOH consumption in 30 years. Reports infrequent marijuana smoking 30 years ago.  On Family Hx the pt reports maternal cousin with brain cancer, maternal cousin with pancreatic cancer, maternal uncle with cancer. Denies much paternal knowledge.   Interval History:   Travis Mcdonald returns today regarding his tonsillar carcinoma. The patient's last visit with [place] was  on 02/26/18. He is accompanied today by his wife. The pt reports significant throat pain that has developed in the interim.   The pt reports a troublesome cough with some mucous, and denies nasal discharge. He notes  that his throat pain has significantly increased in the interim, which is exacerbated further by his cough.  The pt notes that in the last 24 hours, he took 6 Oxycodone for his pain, noting that 2 pills controls his pain for about 2 hours. He notes that his throat pain is between 8-10, and drops to a 6 after taking the Oxycodone. He also notes difficulty swallowing pills. The pt denies any concerns for sleep apnea.   The pt notes that he is moving his bowels well and has Senna S on board already if constipation develops.   He has lost 15 pounds in the last three weeks. He has been evaluated to consume 7 cans for PEG tube feeding, and is consuming about 4 cans each day. He notes that his coughing is a limiting factor for his food consumption.   The pt is continuing to receive concurrent RT with Dr. Isidore Moos and is set to complete radiation on 03/26/18.   Lab results today (03/14/18) of CBC w/diff, CMP is as follows: all values are WNL except for WBC at 2.8k, HGB at 12.7, HCT at 36.9, PLT at 110lk, Lymphs abs at 400, Chloride at 97, Glucose at 160, AST at 12. Magnesium 03/14/18 is WNL at 1.8 Phosphorous 03/14/18 is pending  On review of systems, pt reports significant throat pain, mildly productive cough, occasional chills, abdominal pains, stable non-bothersome occasional tinnitus, moving his bowels well, and denies fevers, changes in hearing, nasal discharge, and any other symptoms.   MEDICAL HISTORY:  Past Medical History:  Diagnosis Date  . Hypertension     SURGICAL HISTORY: Past Surgical History:  Procedure Laterality Date  . [place]   ruptured lower back disc surgery. L4 & L5 Lumber disc  . IR GASTROSTOMY TUBE MOD SED  02/02/2018  . IR IMAGING GUIDED PORT INSERTION  02/02/2018    SOCIAL HISTORY: Social History   Socioeconomic History  . Marital status: Married    Spouse name: Not on file  . Number of children: Not on file  . Years of education: Not on file  .  Highest education level: Not on file  Occupational History  . Not on file  Social Needs  . Financial resource strain: Not on file  . Food insecurity:    Worry: Not on file    Inability: Not on file  . Transportation needs:    Medical: Not on file    Non-medical: Not on file  Tobacco Use  . Smoking status: Never Smoker  . Smokeless tobacco: Never Used  Substance and Sexual Activity  . Alcohol use: No    Comment: no alcohol for 30 years.   . Drug use: No  . Sexual activity: Not on file  Lifestyle  . Physical activity:    Days per week: Not on file    Minutes per session: Not on file  . Stress: Not on file  Relationships  . Social connections:    Talks on phone: Not on file    Gets together: Not on file    Attends religious service: Not on file    Active member of club or organization: Not on file    Attends meetings of clubs or organizations: Not on file    Relationship  status: Not on file  . Intimate partner violence:    Fear of current or ex partner: Not on file    Emotionally abused: Not on file    Physically abused: Not on file    Forced sexual activity: Not on file  Other Topics Concern  . Not on file  Social History Narrative  . Not on file    FAMILY HISTORY: History reviewed. No pertinent family history.  ALLERGIES:  has No Known Allergies.  MEDICATIONS:  Current Outpatient Medications  Medication Sig Dispense Refill  . clotrimazole (LOTRIMIN AF) 1 % cream Apply 1 application topically 2 (two) times daily. To rash on rt leg 60 g 0  . dexamethasone (DECADRON) 4 MG tablet Take 2 tablets by mouth once a day on the day after chemotherapy and then take 2 tablets two times a day for 2 days. Take with food. 30 tablet 1  . enalapril-hydrochlorothiazide (VASERETIC) 10-25 MG tablet Take 1 tablet by mouth daily.     Marland Kitchen lidocaine (XYLOCAINE) 2 % solution Patient: Mix 1part 2% viscous lidocaine, 1part H20. Swish & swallow 53mL of diluted mixture, 102min before meals and at  bedtime, up to QID 100 mL 5  . lidocaine-prilocaine (EMLA) cream Apply to affected area once 30 g 3  . LORazepam (ATIVAN) 1 MG tablet Take 1-2 tablets sublingually 30 min before radiotherapy. 22 tablet 0  . Nutritional Supplements (FEEDING SUPPLEMENT, OSMOLITE 1.5 CAL,) LIQD Begin 1 carton Osmolite 1.5 via PEG QID with 120 mL free water before and after. Increase to 1 1/2 cartons on Thursday as tolerated. Increase to 2 cartons bid and 1 1/2 cartons bid on Saturday. Flush tube with additional 240 mL free water TID between feedings. 7 Bottle 0  . ondansetron (ZOFRAN) 8 MG tablet Take 1 tablet (8 mg total) by mouth 2 (two) times daily as needed. Start on the third day after chemotherapy. 30 tablet 1  . oxyCODONE (OXY IR/ROXICODONE) 5 MG immediate release tablet Take 1-2 tablets (5-10 mg total) by mouth every 4 (four) hours as needed for moderate pain or severe pain. 30 tablet 0  . prochlorperazine (COMPAZINE) 10 MG tablet Take 1 tablet (10 mg total) by mouth every 6 (six) hours as needed (Nausea or vomiting). 30 tablet 1  . senna-docusate (SENNA S) 8.6-50 MG tablet Take 2 tablets by mouth at bedtime as needed for mild constipation or moderate constipation. 60 tablet 1  . sodium fluoride (FLUORISHIELD) 1.1 % GEL dental gel Instill one drop of gel per tooth space of fluoride tray. Place over teeth for 5 minutes. Remove. Spit out excess. Repeat nightly. 120 mL prn   No current facility-administered medications for this visit.     REVIEW OF SYSTEMS:    A 10+ POINT REVIEW OF SYSTEMS WAS OBTAINED including neurology, dermatology, psychiatry, cardiac, respiratory, lymph, extremities, GI, GU, Musculoskeletal, constitutional, breasts, reproductive, HEENT.  All pertinent positives are noted in the HPI.  All others are negative.   PHYSICAL EXAMINATION: ECOG PERFORMANCE STATUS: 1 - Symptomatic but completely ambulatory  VS reviewed GENERAL:alert, in no acute distress and comfortable SKIN: rash on upper right  leg, no significant lesions EYES: conjunctiva are pink and non-injected, sclera anicteric OROPHARYNX: MMM, no exudates, no oropharyngeal erythema or ulceration NECK: supple, no JVD LYMPH:  no palpable lymphadenopathy in the cervical, axillary or inguinal regions LUNGS: clear to auscultation b/l with normal respiratory effort, port a cath in situ HEART: regular rate & rhythm ABDOMEN:  normoactive bowel sounds , non tender,  not distended, g tube in situ Extremity: no pedal edema PSYCH: alert & oriented x 3 with fluent speech NEURO: no focal motor/sensory deficits    LABORATORY DATA:  I have reviewed the data as listed  . CBC Latest Ref Rng & Units 03/14/2018 03/06/2018 02/26/2018  WBC 4.0 - 10.3 K/uL 2.8(L) 3.8(L) 5.5  Hemoglobin 13.0 - 17.1 g/dL 12.7(L) 14.7 16.6  Hematocrit 38.4 - 49.9 % 36.9(L) 43.0 47.8  Platelets 140 - 400 K/uL 110(L) 150 140    . CMP Latest Ref Rng & Units 03/14/2018 03/06/2018 02/26/2018  Glucose 70 - 99 mg/dL 160(H) 139(H) 178(H)  BUN 6 - 20 mg/dL 17 23(H) 19  Creatinine 0.61 - 1.24 mg/dL 0.98 1.12 1.07  Sodium 135 - 145 mmol/L 137 138 137  Potassium 3.5 - 5.1 mmol/L 3.8 3.9 3.8  Chloride 98 - 111 mmol/L 97(L) 99 98  CO2 22 - 32 mmol/L 31 27 28   Calcium 8.9 - 10.3 mg/dL 9.4 9.6 9.9  Total Protein 6.5 - 8.1 g/dL 6.5 6.8 7.1  Total Bilirubin 0.3 - 1.2 mg/dL 0.4 0.4 0.5  Alkaline Phos 38 - 126 U/L 77 85 86  AST 15 - 41 U/L 12(L) 19 15  ALT 0 - 44 U/L 28 39 45(H)   11/10/17 Comprehensive Hearing Test:    12/27/17 Surgical Pathology:    RADIOGRAPHIC STUDIES: I have personally reviewed the radiological images as listed and agreed with the findings in the report. No results found.  ASSESSMENT & PLAN:   52 y.o. 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 without any cigarette or alcohol history  PLAN:  -Continue with radiation -Continue Senna S -Stay physically active, walking at least 20-30 minutes each day -Continue follow up with our nutritional therapist, Ernestene Kiel  -Continue salt/baking soda mouth washes 4-5 times a day  -Discussed pt labwork today, 03/14/18; HGB at 12.7, PLT at 110k -Began pt on Oxycodone in the past week, will switch to liquid form -Will now begin Fentanyl patch for increased pain control needs -Recommended steam inhalation from distilled water for mucous  -Ordering viscous lidocaine and Cepacol  -The pt has no prohibitive toxicities from continuing Cisplatin 40mg /m2 at this time.   -Begin Claritin if post nasal drip develops -Will see pt back in one week for symptom management   2. Eczematoid skin rash on his lower extremities RT>left. Resolved with anti fungal - fluconazole. Plan -completed po fluconazole with near resolution of rash -conitnue topical anti-fungals . -Continue moisturizing previously fungal-affected skin areas on bilateral LE, use colloidal silver soap for anti-fungal prevention    RTC with labs/MD visit/Last dose of Cisplatin in 7 days. (will need to move the scheduled treatment for 8/5 to 03/21/2018 since its only 5 days from today)     All of the patients questions were answered with apparent satisfaction. The patient knows to call the clinic with any problems, questions or concerns.  The total time spent in the appt was 30 minutes and more than 50% was on counseling and direct patient cares.    Sullivan Lone MD [place] AAHIVMS [facility] Hematology/Oncology Physician [facility]  (Office):       [phone] (Work cell):  [phone] (Fax):           [phone]  03/14/2018 9:08 AM  I, Baldwin [place], am acting as a scribe for Dr. Irene Limbo  .I have reviewed the above documentation for accuracy and completeness, and I agree with the  above. Brunetta Genera MD

## 2018-03-14 ENCOUNTER — Ambulatory Visit
Admission: RE | Admit: 2018-03-14 | Discharge: 2018-03-14 | Disposition: A | Discharge status: Home / Self Care | Payer: PRIVATE HEALTH INSURANCE | Source: Ambulatory Visit | Attending: Radiation Oncology | Admitting: Radiation Oncology

## 2018-03-14 ENCOUNTER — Telehealth: Payer: Self-pay

## 2018-03-14 ENCOUNTER — Inpatient Hospital Stay: Payer: PRIVATE HEALTH INSURANCE

## 2018-03-14 ENCOUNTER — Encounter: Payer: Self-pay | Admitting: *Deleted

## 2018-03-14 ENCOUNTER — Inpatient Hospital Stay (HOSPITAL_BASED_OUTPATIENT_CLINIC_OR_DEPARTMENT_OTHER): Payer: PRIVATE HEALTH INSURANCE | Admitting: Hematology

## 2018-03-14 ENCOUNTER — Telehealth: Payer: Self-pay | Admitting: *Deleted

## 2018-03-14 ENCOUNTER — Encounter: Payer: Self-pay | Admitting: Hematology

## 2018-03-14 ENCOUNTER — Inpatient Hospital Stay: Payer: PRIVATE HEALTH INSURANCE | Admitting: Nutrition

## 2018-03-14 VITALS — BP 127/89 | HR 104 | Temp 99.1°F | Resp 18 | Ht 74.0 in | Wt 253.3 lb

## 2018-03-14 DIAGNOSIS — Z931 Gastrostomy status: Secondary | ICD-10-CM

## 2018-03-14 DIAGNOSIS — R21 Rash and other nonspecific skin eruption: Secondary | ICD-10-CM | POA: Diagnosis not present

## 2018-03-14 DIAGNOSIS — C799 Secondary malignant neoplasm of unspecified site: Secondary | ICD-10-CM

## 2018-03-14 DIAGNOSIS — Z95828 Presence of other vascular implants and grafts: Secondary | ICD-10-CM

## 2018-03-14 DIAGNOSIS — K5900 Constipation, unspecified: Secondary | ICD-10-CM

## 2018-03-14 DIAGNOSIS — C090 Malignant neoplasm of tonsillar fossa: Secondary | ICD-10-CM

## 2018-03-14 DIAGNOSIS — Z510 Encounter for antineoplastic radiation therapy: Secondary | ICD-10-CM | POA: Diagnosis not present

## 2018-03-14 DIAGNOSIS — Z5111 Encounter for antineoplastic chemotherapy: Secondary | ICD-10-CM

## 2018-03-14 DIAGNOSIS — C099 Malignant neoplasm of tonsil, unspecified: Secondary | ICD-10-CM | POA: Diagnosis not present

## 2018-03-14 DIAGNOSIS — G893 Neoplasm related pain (acute) (chronic): Secondary | ICD-10-CM

## 2018-03-14 DIAGNOSIS — Z79899 Other long term (current) drug therapy: Secondary | ICD-10-CM

## 2018-03-14 DIAGNOSIS — K1233 Oral mucositis (ulcerative) due to radiation: Secondary | ICD-10-CM

## 2018-03-14 DIAGNOSIS — Z7189 Other specified counseling: Secondary | ICD-10-CM

## 2018-03-14 DIAGNOSIS — IMO0002 Reserved for concepts with insufficient information to code with codable children: Secondary | ICD-10-CM

## 2018-03-14 LAB — CMP (CANCER CENTER ONLY)
ALT: 28 U/L (ref 0–44)
AST: 12 U/L — AB (ref 15–41)
Albumin: 3.6 g/dL (ref 3.5–5.0)
Alkaline Phosphatase: 77 U/L (ref 38–126)
Anion gap: 9 (ref 5–15)
BUN: 17 mg/dL (ref 6–20)
CHLORIDE: 97 mmol/L — AB (ref 98–111)
CO2: 31 mmol/L (ref 22–32)
CREATININE: 0.98 mg/dL (ref 0.61–1.24)
Calcium: 9.4 mg/dL (ref 8.9–10.3)
GFR, Est AFR Am: >60 mL/min (ref >60)
GFR, Estimated: >60 mL/min (ref >60)
Glucose, Bld: 160 mg/dL — ABNORMAL HIGH (ref 70–99)
POTASSIUM: 3.8 mmol/L (ref 3.5–5.1)
Sodium: 137 mmol/L (ref 135–145)
Total Bilirubin: 0.4 mg/dL (ref 0.3–1.2)
Total Protein: 6.5 g/dL (ref 6.5–8.1)

## 2018-03-14 LAB — CBC WITH DIFFERENTIAL/PLATELET
Basophils Absolute: 0 10*3/uL (ref 0.0–0.1)
Basophils Relative: 0 %
EOS ABS: 0.1 10*3/uL (ref 0.0–0.5)
EOS PCT: 3 %
HCT: 36.9 % — ABNORMAL LOW (ref 38.4–49.9)
Hemoglobin: 12.7 g/dL — ABNORMAL LOW (ref 13.0–17.1)
LYMPHS ABS: 0.4 10*3/uL — AB (ref 0.9–3.3)
LYMPHS PCT: 15 %
MCH: 28.8 pg (ref 27.2–33.4)
MCHC: 34.4 g/dL (ref 32.0–36.0)
MCV: 83.7 fL (ref 79.3–98.0)
MONO ABS: 0.4 10*3/uL (ref 0.1–0.9)
MONOS PCT: 14 %
Neutro Abs: 1.9 10*3/uL (ref 1.5–6.5)
Neutrophils Relative %: 68 %
PLATELETS: 110 10*3/uL — AB (ref 140–400)
RBC: 4.41 MIL/uL (ref 4.20–5.82)
RDW: 14 % (ref 11.0–14.6)
WBC: 2.8 10*3/uL — ABNORMAL LOW (ref 4.0–10.3)

## 2018-03-14 LAB — MAGNESIUM: MAGNESIUM: 1.8 mg/dL (ref 1.7–2.4)

## 2018-03-14 LAB — PHOSPHORUS: PHOSPHORUS: 2.9 mg/dL (ref 2.5–4.6)

## 2018-03-14 MED ORDER — FENTANYL 25 MCG/HR TD PT72: 25.0000 ug | MEDICATED_PATCH | TRANSDERMAL | 0 refills | Status: DC

## 2018-03-14 MED ORDER — SODIUM CHLORIDE 0.9% FLUSH
10.0000 mL | INTRAVENOUS | Status: DC | PRN
  Administered 2018-03-14: 10 mL
  Filled 2018-03-14: qty 10

## 2018-03-14 MED ORDER — OXYCODONE HCL 20 MG/ML PO CONC: 10.0000 mg | ORAL | 0 refills | Status: DC | PRN

## 2018-03-14 MED ORDER — CISPLATIN CHEMO INJECTION 100MG/100ML
39.2000 mg/m2 | Freq: Once | INTRAVENOUS | Status: AC
  Administered 2018-03-14: 100 mg via INTRAVENOUS
  Filled 2018-03-14: qty 100

## 2018-03-14 MED ORDER — PALONOSETRON HCL INJECTION 0.25 MG/5ML
0.2500 mg | Freq: Once | INTRAVENOUS | Status: AC
  Administered 2018-03-14: 0.25 mg via INTRAVENOUS

## 2018-03-14 MED ORDER — BENZOCAINE-MENTHOL 10-2.1 MG MT LOZG: 1.0000 | LOZENGE | OROMUCOSAL | 2 refills | Status: DC | PRN

## 2018-03-14 MED ORDER — SODIUM CHLORIDE 0.9 % IV SOLN
INTRAVENOUS | Status: DC
  Administered 2018-03-14: 09:00:00 via INTRAVENOUS
  Filled 2018-03-14: qty 250

## 2018-03-14 MED ORDER — SODIUM CHLORIDE 0.9 % IV SOLN
Freq: Once | INTRAVENOUS | Status: AC
  Administered 2018-03-14: 12:00:00 via INTRAVENOUS
  Filled 2018-03-14: qty 5

## 2018-03-14 MED ORDER — PALONOSETRON HCL INJECTION 0.25 MG/5ML
INTRAVENOUS | Status: AC
  Filled 2018-03-14: qty 5

## 2018-03-14 MED ORDER — POTASSIUM CHLORIDE 2 MEQ/ML IV SOLN
Freq: Once | INTRAVENOUS | Status: AC
  Administered 2018-03-14: 10:00:00 via INTRAVENOUS
  Filled 2018-03-14: qty 10

## 2018-03-14 MED ORDER — HEPARIN SOD (PORK) LOCK FLUSH 100 UNIT/ML IV SOLN
500.0000 [IU] | Freq: Once | INTRAVENOUS | Status: AC | PRN
  Administered 2018-03-14: 500 [IU]
  Filled 2018-03-14: qty 5

## 2018-03-14 NOTE — Progress Notes (Signed)
Oncology Nurse Navigator Documentation  Encountered pt's wife in lobby hall.  She emotionally expressed concerns for his well being, challenges at this point in his tmts.   I provided encouragement and emotional support, provided assurance their experiences as patient and caregiver are expected/normal. I answered her questions re timeframe of post-tmt re-staging, PEG removal, resolution of SEs.  I later met with her in Infusion to provide notes of support/encouragement written by a previous patient/caregiver who attended H&N FYNN. She expressed appreciation.  [name], RN, BSN Head & Neck Oncology Nurse [place] at [employer] [phone]

## 2018-03-14 NOTE — Telephone Encounter (Signed)
Oncology Nurse Navigator Documentation  Per pt's request, faxed completed Attending Physician's Statement to Cordelia Poche, [facility].  Notification of successful fax transmission received.  Provided copy to pt's wife upon their arrival for today's infusion.  [name], RN, BSN Head & Neck Oncology Nurse [place] at [place] [phone]

## 2018-03-14 NOTE — Telephone Encounter (Signed)
Printed avs and calender of upcoming appointment. Will be taking papers to infusion area. Per 7/31 los

## 2018-03-14 NOTE — Patient Instructions (Signed)
[facility] Discharge Instructions for Patients Receiving Chemotherapy  Today you received the following chemotherapy agents: Cisplatin (Platinol)  To help prevent nausea and vomiting after your treatment, we encourage you to take your nausea medication as prescribed. Received Aloxi during treatment today-->Take Compazine (not Zofran) for the next 3 days as needed.    If you develop nausea and vomiting that is not controlled by your nausea medication, call the clinic.   BELOW ARE SYMPTOMS THAT SHOULD BE REPORTED IMMEDIATELY:  *FEVER GREATER THAN 100.5 F  *CHILLS WITH OR WITHOUT FEVER  NAUSEA AND VOMITING THAT IS NOT CONTROLLED WITH YOUR NAUSEA MEDICATION  *UNUSUAL SHORTNESS OF BREATH  *UNUSUAL BRUISING OR BLEEDING  TENDERNESS IN MOUTH AND THROAT WITH OR WITHOUT PRESENCE OF ULCERS  *URINARY PROBLEMS  *BOWEL PROBLEMS  UNUSUAL RASH Items with * indicate a potential emergency and should be followed up as soon as possible.  Feel free to call the clinic should you have any questions or concerns. The clinic phone number is [phone].  Please show the [place] at check-in to the Emergency Department and triage nurse.

## 2018-03-14 NOTE — Progress Notes (Signed)
Nutrition follow-up for tonsil cancer. Patient was sleeping so discussed nutrition information with patient's wife. Weight improved slightly and documented as 253.3 pounds July 31 increased from 251 pounds July 23. Patient is not eating or drinking much. He is averaging 5 bottles of Osmolite 1.5 via PEG daily. Patient's wife states that when he tolerates more, she often gives more but tolerance is variable. She reports he is getting all the free water recommended.    Estimated nutrition needs: 2400-2600 cal, 135-150 g protein, 2.7 L fluid.  Nutrition diagnosis: Inadequate oral intake continues.  Intervention: Recommended patient work to increase Osmolite 1.5 to goal rate of 7 bottles daily. Patient should flush feeding tube with 120 mL of free water before and after bolus feeding. In addition, 240 cc of water should be given 3 times daily between feedings. Questions were answered teach back method used.  7 bottles of Osmolite 1.5+ free water flushes provides 2485 cal, 104 g protein, and 2947 mL free water.  Monitoring, evaluation, goals: Patient will tolerate increased tube feeding of Osmolite 1.5-7 bottles daily. Once tube feeding at goal rate will consider protein supplement.  Next visit: Monday, August 5 during infusion.  **Disclaimer: This note was dictated with voice recognition software. Similar sounding words can inadvertently be transcribed and this note may contain transcription errors which may not have been corrected upon publication of note.**

## 2018-03-15 ENCOUNTER — Telehealth: Payer: Self-pay

## 2018-03-15 ENCOUNTER — Ambulatory Visit
Admission: RE | Admit: 2018-03-15 | Discharge: 2018-03-15 | Disposition: A | Discharge status: Home / Self Care | Payer: PRIVATE HEALTH INSURANCE | Source: Ambulatory Visit | Attending: Radiation Oncology | Admitting: Radiation Oncology

## 2018-03-15 DIAGNOSIS — Z510 Encounter for antineoplastic radiation therapy: Secondary | ICD-10-CM | POA: Diagnosis present

## 2018-03-15 DIAGNOSIS — C090 Malignant neoplasm of tonsillar fossa: Secondary | ICD-10-CM | POA: Diagnosis not present

## 2018-03-15 NOTE — Telephone Encounter (Signed)
Printed avs and calender of upcoming appointment, and gave documents to  patient in infusion. Per 8/31 los

## 2018-03-16 ENCOUNTER — Encounter: Payer: Self-pay | Admitting: *Deleted

## 2018-03-16 ENCOUNTER — Ambulatory Visit
Admission: RE | Admit: 2018-03-16 | Discharge: 2018-03-16 | Disposition: A | Discharge status: Home / Self Care | Payer: PRIVATE HEALTH INSURANCE | Source: Ambulatory Visit | Attending: Radiation Oncology | Admitting: Radiation Oncology

## 2018-03-16 DIAGNOSIS — Z510 Encounter for antineoplastic radiation therapy: Secondary | ICD-10-CM | POA: Diagnosis not present

## 2018-03-16 NOTE — Progress Notes (Signed)
Oncology Nurse Navigator Documentation  Met with Mr. Durr s/p today's RT to check on well being.  He was accompanied by his dtr. He acknowledged fatigue, throat soreness as expected SEs. I provided appt calendar for remaining appts in August, noting specifically appt flow for Monday. I encouraged him to call me with needs/concerns.  [name], RN, BSN Head & Neck Oncology Nurse [place] at [facility] [phone]

## 2018-03-19 ENCOUNTER — Ambulatory Visit: Payer: PRIVATE HEALTH INSURANCE

## 2018-03-19 ENCOUNTER — Ambulatory Visit: Payer: Self-pay

## 2018-03-19 ENCOUNTER — Ambulatory Visit: Payer: PRIVATE HEALTH INSURANCE | Attending: Internal Medicine

## 2018-03-19 ENCOUNTER — Other Ambulatory Visit: Payer: Self-pay

## 2018-03-19 ENCOUNTER — Telehealth: Payer: Self-pay | Admitting: *Deleted

## 2018-03-19 ENCOUNTER — Inpatient Hospital Stay: Payer: PRIVATE HEALTH INSURANCE | Attending: Hematology | Admitting: Nutrition

## 2018-03-19 DIAGNOSIS — Z931 Gastrostomy status: Secondary | ICD-10-CM | POA: Insufficient documentation

## 2018-03-19 DIAGNOSIS — E876 Hypokalemia: Secondary | ICD-10-CM | POA: Insufficient documentation

## 2018-03-19 DIAGNOSIS — C099 Malignant neoplasm of tonsil, unspecified: Secondary | ICD-10-CM | POA: Insufficient documentation

## 2018-03-19 DIAGNOSIS — Z923 Personal history of irradiation: Secondary | ICD-10-CM | POA: Insufficient documentation

## 2018-03-19 DIAGNOSIS — R21 Rash and other nonspecific skin eruption: Secondary | ICD-10-CM | POA: Insufficient documentation

## 2018-03-19 DIAGNOSIS — K5900 Constipation, unspecified: Secondary | ICD-10-CM | POA: Insufficient documentation

## 2018-03-19 DIAGNOSIS — K1230 Oral mucositis (ulcerative), unspecified: Secondary | ICD-10-CM | POA: Insufficient documentation

## 2018-03-19 NOTE — Telephone Encounter (Signed)
Oncology Nurse Navigator Documentation  Rec'd call from pt wife reporting he is cancelling this morning's appts:  He has stopped using fentanyl patch Rxed last Wed b/c causing nausea, HAs; picking up oxycodone liquid Rxed by Dr. Irene Limbo last week Wed later this morning.   He is afebrile.    She denied his need to be seen by physician, is going to give him anti-emetic shortly.  I encouraged his arrival for tomorrow's RT so can be seen for weekly PUT.  She voiced understanding.   [name], RN, BSN Head & Neck Oncology Nurse [place] at [place] [phone]

## 2018-03-20 ENCOUNTER — Encounter: Payer: Self-pay | Admitting: *Deleted

## 2018-03-20 ENCOUNTER — Ambulatory Visit
Admission: RE | Admit: 2018-03-20 | Discharge: 2018-03-20 | Disposition: A | Discharge status: Home / Self Care | Payer: PRIVATE HEALTH INSURANCE | Source: Ambulatory Visit | Attending: Radiation Oncology | Admitting: Radiation Oncology

## 2018-03-20 DIAGNOSIS — Z510 Encounter for antineoplastic radiation therapy: Secondary | ICD-10-CM | POA: Diagnosis not present

## 2018-03-21 ENCOUNTER — Other Ambulatory Visit: Payer: Self-pay

## 2018-03-21 ENCOUNTER — Ambulatory Visit
Admission: RE | Admit: 2018-03-21 | Discharge: 2018-03-21 | Disposition: A | Discharge status: Home / Self Care | Payer: PRIVATE HEALTH INSURANCE | Source: Ambulatory Visit | Attending: Radiation Oncology | Admitting: Radiation Oncology

## 2018-03-21 DIAGNOSIS — Z510 Encounter for antineoplastic radiation therapy: Secondary | ICD-10-CM | POA: Diagnosis not present

## 2018-03-21 NOTE — Progress Notes (Signed)
HEMATOLOGY/ONCOLOGY CLINIC NOTE  Date of Service: 03/22/2018  Patient Care Team: Celene Squibb, MD as PCP - General (Internal Medicine) Rozetta Nunnery, MD as Consulting Physician (Otolaryngology) Eppie Gibson, MD as Attending Physician (Radiation Oncology) Brunetta Genera, MD as Consulting Physician (Hematology) Leota Sauers, RN as Oncology Nurse Navigator Karie Mainland, RD as Dietitian (Nutrition) Valentino Saxon [place], CCC-SLP as Speech Language Pathologist (Speech Pathology) Wynelle Beckmann, Melodie Bouillon, PT as Physical Therapist (Physical Therapy) Kennith Center, LCSW as Social Worker  CHIEF COMPLAINTS/PURPOSE OF CONSULTATION:  F/u for Tonsillar carcinoma   HISTORY OF PRESENTING ILLNESS:   Travis Mcdonald is a wonderful 52 y.o. 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 He is accompanied today by his wife. The pt reports that he is doing well overall.   The pt reports first noticing an enlargement of his left neck around January 2019. He believed these to be sinus related since he took sinus medications which decreased the swelling. He notes aggravating pain but denies significant pain or problems swallowing. He reports left ear and jaw pain as well.   Four teeth extraction on 01/19/18 with radiation planning this afternoon and tentative radiation beginning 02/05/18.   He denies any ear infection concerns after his audiogram. He denies recurrent ear infections but notes that he had two ear infections last year.    The pt notes that he will try to continue work each day.  He adds that he has a skin rash on his right upper leg which is recurrent in the summers. He denies seeing a dermatologist in the past and treats the associated itching with cortisone and benadryl.   He notes that he does snore at night and his wife notes that he does not snore when he sleeps on his side.   Of note prior to the patient's visit  today, pt has had PET/CT completed on 01/04/18 with results revealing Extensive hypermetabolic activity in the left neck, index lesion maximum SUV 20.3. Looking for a primary lesion in the neck, there is some very faint asymmetry of the left palatine tonsillar pillar which could conceivably represent a small lesion. 2. No significant abnormal activity in the chest, abdomen/pelvis, or skeleton. 3. Other imaging findings of potential clinical significance: Aortic Atherosclerosis (ICD10-I70.0). Coronary atherosclerosis. Small mucous retention cyst in left maxillary sinus. Diffuse hepatic steatosis. Nonspecific but probably benign hypodense lesions in the lateral segment left hepatic lobe.   Most recent lab results (01/24/18) of CBC w/diff, and CMP  is as follows: all values are WNL except glucose at 162. Magnesium 01/24/18 is WNL at 1.8 Phosphorus 01/24/18 is WNL at 2.7  On review of systems, pt reports left ear pain, left neck discomfort, left jaw pain, upper right leg rash, and denies abdominal pains, other skin rashes, and any other symptoms.   On PMHx the pt reports 1998 and 1999 back surgeries for ruptured disks with microdiscectomy, high blood pressure, denies diabetes, heart, lung or kidney problems. 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 Denies ever smoking cigarettes. Denies ETOH consumption in 30 years. Reports infrequent marijuana smoking 30 years ago.  On Family Hx the pt reports maternal cousin with brain cancer, maternal cousin with pancreatic cancer, maternal uncle with cancer. Denies much paternal knowledge.   Interval History:   RENALDO Mcdonald returns today regarding his tonsillar carcinoma. He is accompanied by his wife today.  The patient's last visit with [place] was on 03/14/18. He is accompanied today by his daughter. The pt reports that he is doing well overall.   The pt was unable to tolerate the Fentanyl patch, developing hallucinations, nausea,  and headaches. He notes that he saw several glowing spiders on the ceiling while on the Fentanyl patch and decided to take it off.  The pt reports that he has a bad cough that is keeping him up at night some and he feels that there is a post-nasal drip. He has been using mouthwashes and salt/baking soda. He was unable to use the Cepacol losanges which intensified his pain. He notes that he is using liquid oxycodone every 6 hours. After trying to swallow his Ativan yesterday, before radiation, he vomited a couple times. He has not been able to tolerate lidocaine either.   The pt has lost 21 pounds in the last month and has been continuing to take Decadron.   The pt is swallowing very little water PO for his pills, but is consuming nutrition 100% through his tube.   The pt notes that he hasn't developed any hearing difficulty but notes his tinnitus may be increasing.   Lab results today (03/22/18) of CBC w/diff, CMP is as follows: all values are WNL except for WBC at 2.7k, RBC at 4.09, HGB at 12.1, HCT at 34.2, RDW at 15.3, PLT at 139k, Lymphs abs at 500, Chloride at 93, Glucose at 127, AST at 13. Magnesium 03/22/18 is low at 1.5. Phosphorous 03/22/18 is WNL at 3.8  On review of systems, pt reports throat pain, weight loss, cough, some headaches, vomiting, and denies constipation, hearing changes, and any other symptoms.    MEDICAL HISTORY:  Past Medical History:  Diagnosis Date  . Hypertension     SURGICAL HISTORY: Past Surgical History:  Procedure Laterality Date  . [place]   ruptured lower back disc surgery. L4 & L5 Lumber disc  . IR GASTROSTOMY TUBE MOD SED  02/02/2018  . IR IMAGING GUIDED PORT INSERTION  02/02/2018    SOCIAL HISTORY: Social History   Socioeconomic History  . Marital status: Married    Spouse name: Not on file  . Number of children: Not on file  . Years of education: Not on file  . Highest education level: Not on file  Occupational History  . Not  on file  Social Needs  . Financial resource strain: Not on file  . Food insecurity:    Worry: Not on file    Inability: Not on file  . Transportation needs:    Medical: Not on file    Non-medical: Not on file  Tobacco Use  . Smoking status: Never Smoker  . Smokeless tobacco: Never Used  Substance and Sexual Activity  . Alcohol use: No    Comment: no alcohol for 30 years.   . Drug use: No  . Sexual activity: Not on file  Lifestyle  . Physical activity:    Days per week: Not on file    Minutes per session: Not on file  . Stress: Not on file  Relationships  . Social connections:    Talks on phone: Not on file    Gets together: Not on file    Attends religious service: Not on file    Active member of club or organization: Not on file    Attends meetings of clubs or organizations: Not on file    Relationship status: Not on file  .  Intimate partner violence:    Fear of current or ex partner: Not on file    Emotionally abused: Not on file    Physically abused: Not on file    Forced sexual activity: Not on file  Other Topics Concern  . Not on file  Social History Narrative  . Not on file    FAMILY HISTORY: No family history on file.  ALLERGIES:  has No Known Allergies.  MEDICATIONS:  Current Outpatient Medications  Medication Sig Dispense Refill  . clotrimazole (LOTRIMIN AF) 1 % cream Apply 1 application topically 2 (two) times daily. To rash on rt leg 60 g 0  . dexamethasone (DECADRON) 4 MG tablet Take 2 tablets by mouth once a day on the day after chemotherapy and then take 2 tablets two times a day for 2 days. Take with food. 30 tablet 1  . enalapril-hydrochlorothiazide (VASERETIC) 10-25 MG tablet Take 1 tablet by mouth daily.     Marland Kitchen lidocaine (XYLOCAINE) 2 % solution Patient: Mix 1part 2% viscous lidocaine, 1part H20. Swish & swallow 31mL of diluted mixture, 72min before meals and at bedtime, up to QID 100 mL 5  . lidocaine-prilocaine (EMLA) cream Apply to affected  area once 30 g 3  . LORazepam (LORAZEPAM INTENSOL) 2 MG/ML concentrated solution Take 0.5 mLs (1 mg total) by mouth as directed. 30 mins prior to radiation 15 mL 0  . Nutritional Supplements (FEEDING SUPPLEMENT, OSMOLITE 1.5 CAL,) LIQD Begin 1 carton Osmolite 1.5 via PEG QID with 120 mL free water before and after. Increase to 1 1/2 cartons on Thursday as tolerated. Increase to 2 cartons bid and 1 1/2 cartons bid on Saturday. Flush tube with additional 240 mL free water TID between feedings. 7 Bottle 0  . ondansetron (ZOFRAN) 8 MG tablet Take 1 tablet (8 mg total) by mouth 2 (two) times daily as needed. Start on the third day after chemotherapy. 30 tablet 1  . oxyCODONE (ROXICODONE INTENSOL) 20 MG/ML concentrated solution Take 0.5 mLs (10 mg total) by mouth every 4 (four) hours as needed for severe pain or breakthrough pain. 30 mL 0  . prochlorperazine (COMPAZINE) 10 MG tablet Take 1 tablet (10 mg total) by mouth every 6 (six) hours as needed (Nausea or vomiting). 30 tablet 1  . senna-docusate (SENNA S) 8.6-50 MG tablet Take 2 tablets by mouth at bedtime as needed for mild constipation or moderate constipation. 60 tablet 1  . sodium fluoride (FLUORISHIELD) 1.1 % GEL dental gel Instill one drop of gel per tooth space of fluoride tray. Place over teeth for 5 minutes. Remove. Spit out excess. Repeat nightly. 120 mL prn   No current facility-administered medications for this visit.     REVIEW OF SYSTEMS:    A 10+ POINT REVIEW OF SYSTEMS WAS OBTAINED including neurology, dermatology, psychiatry, cardiac, respiratory, lymph, extremities, GI, GU, Musculoskeletal, constitutional, breasts, reproductive, HEENT.  All pertinent positives are noted in the HPI.  All others are negative.   PHYSICAL EXAMINATION: ECOG PERFORMANCE STATUS: 1 - Symptomatic but completely ambulatory  VS reviewed  GENERAL:alert, in no acute distress and comfortable SKIN: no acute rashes, no significant lesions EYES: conjunctiva are  pink and non-injected, sclera anicteric OROPHARYNX: MMM, no exudates, no oropharyngeal erythema or ulceration NECK: supple, no JVD LYMPH:  no palpable lymphadenopathy in the cervical, axillary or inguinal regions LUNGS: clear to auscultation b/l with normal respiratory effort, port a cath in situ HEART: regular rate & rhythm ABDOMEN:  normoactive bowel sounds , non tender,  not distended. PEG tube in situ  Extremity: no pedal edema PSYCH: alert & oriented x 3 with fluent speech NEURO: no focal motor/sensory deficits   LABORATORY DATA:  I have reviewed the data as listed  . CBC Latest Ref Rng & Units 03/22/2018 03/14/2018 03/06/2018  WBC 4.0 - 10.3 K/uL 2.7(L) 2.8(L) 3.8(L)  Hemoglobin 13.0 - 17.1 g/dL 12.1(L) 12.7(L) 14.7  Hematocrit 38.4 - 49.9 % 34.2(L) 36.9(L) 43.0  Platelets 140 - 400 K/uL 139(L) 110(L) 150   . CBC    Component Value Date/Time   WBC 2.7 (L) 03/22/2018 1401   RBC 4.09 (L) 03/22/2018 1401   HGB 12.1 (L) 03/22/2018 1401   HGB 16.6 02/12/2018 1341   HCT 34.2 (L) 03/22/2018 1401   PLT 139 (L) 03/22/2018 1401   PLT 250 02/12/2018 1341   MCV 83.6 03/22/2018 1401   MCH 29.6 03/22/2018 1401   MCHC 35.4 03/22/2018 1401   RDW 15.3 (H) 03/22/2018 1401   LYMPHSABS 0.5 (L) 03/22/2018 1401   MONOABS 0.4 03/22/2018 1401   EOSABS 0.1 03/22/2018 1401   BASOSABS 0.0 03/22/2018 1401    . CMP Latest Ref Rng & Units 03/22/2018 03/14/2018 03/06/2018  Glucose 70 - 99 mg/dL 127(H) 160(H) 139(H)  BUN 6 - 20 mg/dL 16 17 23(H)  Creatinine 0.61 - 1.24 mg/dL 0.98 0.98 1.12  Sodium 135 - 145 mmol/L 135 137 138  Potassium 3.5 - 5.1 mmol/L 3.8 3.8 3.9  Chloride 98 - 111 mmol/L 93(L) 97(L) 99  CO2 22 - 32 mmol/L 30 31 27   Calcium 8.9 - 10.3 mg/dL 9.4 9.4 9.6  Total Protein 6.5 - 8.1 g/dL 6.8 6.5 6.8  Total Bilirubin 0.3 - 1.2 mg/dL 0.7 0.4 0.4  Alkaline Phos 38 - 126 U/L 84 77 85  AST 15 - 41 U/L 13(L) 12(L) 19  ALT 0 - 44 U/L 28 28 39   11/10/17 Comprehensive Hearing  Test:    12/27/17 Surgical Pathology:    RADIOGRAPHIC STUDIES: I have personally reviewed the radiological images as listed and agreed with the findings in the report. No results found.  ASSESSMENT & PLAN:   52 y.o. 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, cN1, cM0, p16: Not Assessed, HPV: Not assessed) 01/04/18 PET which revealed Extensive hypermetabolic activity in the left neck, index lesion maximum SUV 20.3 Assuming HPV driven etiology of tonsillar carcinoma without any cigarette or alcohol history  PLAN:  -Continue Miralax -Stay as physically active as possible, walking at least 20-30 minutes each day -Continue follow up with our nutritional therapist, Ernestene Kiel today -Continue salt/baking soda mouth washes 4-5 times a day  -Discussed pt labwork today, 03/22/18; HGB at 12.1, PLT at 139k -Will cut Decadron dose in half given weight loss -Will order liquid Ativan today -The pt has developed worsening associated symptoms and would like to hold his last chemotherapy scheduled for tomorrow, which is quite reasonable at this time.  -Continue with RT until 03/27/18 completion date -Continue with Sonafen and neopsorin for radiation related skin injury -Will see pt back in 2 weeks  2. Eczematoid skin rash on his lower extremities RT>left. Resolved with anti fungal - fluconazole. Plan -completed po fluconazole with near resolution of rash -conitnue topical anti-fungals . -Continue moisturizing previously fungal-affected skin areas on bilateral LE, use colloidal silver soap for anti-fungal prevention    No Cisplatin Chemotherapy tomorrow Please schedule for IVF and Magnesium tomorrow 2-3 hours prior to Radiation RTC with Dr Irene Limbo  with labs in 2 weeks    All of the patients questions were answered with apparent satisfaction. The patient knows to call the clinic with any problems, questions or concerns.  The total time spent in the appt was 25  minutes and more than 50% was on counseling and direct patient cares.     Sullivan Lone MD MS AAHIVMS [facility] Hematology/Oncology Physician [facility]  (Office):       [phone] (Work cell):  [phone] (Fax):           [phone]  03/22/2018 3:48 PM  I, Baldwin [place], am acting as a scribe for Dr. Irene Limbo  .I have reviewed the above documentation for accuracy and completeness, and I agree with the above. Brunetta Genera MD

## 2018-03-21 NOTE — Progress Notes (Signed)
Oncology Nurse Navigator Documentation  To provide care continuity, met with Travis Mcdonald during weekly PUT with Dr. Daralene Milch. Squire's absence.  He was accompanied by his granddaughter. He reported:  Increasing throat pain.  [place] yet to fill oxycodone liquid Rxed by Dr. Irene Limbo last week Wed b/c had to be ordered.   Has not had BM since Sunday, usual habit is daily.  Instilling 3-4 units Osmolite 1.5 daily.   Applying Neosporin to areas of dry desquamation on neck, applying Sonafine BID. Interventions:  I called [facility] while in exam room to check on status of oxycodone, told med arrived today, Rx can be picked up in next hour.  Pt provided update.  I encourage use of [facility] for future Rx to avoid fulfillment delays.  He acknowledged.   I educated re likelihood of oxycodone-induced constipation, encouraged OTC Miralax this evening, encouraged twice daily until he resumes normal frequency, continue with daily dosage thereafter.  He voiced understanding.  Recognized his determination and compliance with RT and chemo tmts over past weeks, noted final tmt is next Monday which he acknowledged.  [name], RN, BSN Head & Neck Oncology Nurse [place] at [place] [phone]

## 2018-03-22 ENCOUNTER — Ambulatory Visit
Admission: RE | Admit: 2018-03-22 | Discharge: 2018-03-22 | Disposition: A | Discharge status: Home / Self Care | Payer: PRIVATE HEALTH INSURANCE | Source: Ambulatory Visit | Attending: Radiation Oncology | Admitting: Radiation Oncology

## 2018-03-22 ENCOUNTER — Inpatient Hospital Stay (HOSPITAL_BASED_OUTPATIENT_CLINIC_OR_DEPARTMENT_OTHER): Payer: PRIVATE HEALTH INSURANCE | Admitting: Hematology

## 2018-03-22 ENCOUNTER — Telehealth: Payer: Self-pay | Admitting: Hematology

## 2018-03-22 ENCOUNTER — Inpatient Hospital Stay: Payer: PRIVATE HEALTH INSURANCE

## 2018-03-22 ENCOUNTER — Ambulatory Visit: Payer: PRIVATE HEALTH INSURANCE

## 2018-03-22 VITALS — BP 124/89 | HR 108 | Temp 98.7°F | Resp 18 | Ht 74.0 in | Wt 247.2 lb

## 2018-03-22 DIAGNOSIS — Z95828 Presence of other vascular implants and grafts: Secondary | ICD-10-CM

## 2018-03-22 DIAGNOSIS — G893 Neoplasm related pain (acute) (chronic): Secondary | ICD-10-CM

## 2018-03-22 DIAGNOSIS — C090 Malignant neoplasm of tonsillar fossa: Secondary | ICD-10-CM

## 2018-03-22 DIAGNOSIS — C099 Malignant neoplasm of tonsil, unspecified: Secondary | ICD-10-CM

## 2018-03-22 DIAGNOSIS — Z931 Gastrostomy status: Secondary | ICD-10-CM | POA: Diagnosis not present

## 2018-03-22 DIAGNOSIS — E860 Dehydration: Secondary | ICD-10-CM

## 2018-03-22 DIAGNOSIS — Z510 Encounter for antineoplastic radiation therapy: Secondary | ICD-10-CM | POA: Diagnosis not present

## 2018-03-22 DIAGNOSIS — R110 Nausea: Secondary | ICD-10-CM

## 2018-03-22 DIAGNOSIS — K1230 Oral mucositis (ulcerative), unspecified: Secondary | ICD-10-CM

## 2018-03-22 DIAGNOSIS — E876 Hypokalemia: Secondary | ICD-10-CM | POA: Diagnosis not present

## 2018-03-22 DIAGNOSIS — Y842 Radiological procedure and radiotherapy as the cause of abnormal reaction of the patient, or of later complication, without mention of misadventure at the time of the procedure: Secondary | ICD-10-CM

## 2018-03-22 DIAGNOSIS — K5900 Constipation, unspecified: Secondary | ICD-10-CM | POA: Diagnosis not present

## 2018-03-22 DIAGNOSIS — R21 Rash and other nonspecific skin eruption: Secondary | ICD-10-CM

## 2018-03-22 DIAGNOSIS — Z923 Personal history of irradiation: Secondary | ICD-10-CM | POA: Diagnosis not present

## 2018-03-22 DIAGNOSIS — K1233 Oral mucositis (ulcerative) due to radiation: Secondary | ICD-10-CM

## 2018-03-22 LAB — CMP (CANCER CENTER ONLY)
ALT: 28 U/L (ref 0–44)
ANION GAP: 12 (ref 5–15)
AST: 13 U/L — AB (ref 15–41)
Albumin: 3.5 g/dL (ref 3.5–5.0)
Alkaline Phosphatase: 84 U/L (ref 38–126)
BILIRUBIN TOTAL: 0.7 mg/dL (ref 0.3–1.2)
BUN: 16 mg/dL (ref 6–20)
CO2: 30 mmol/L (ref 22–32)
Calcium: 9.4 mg/dL (ref 8.9–10.3)
Chloride: 93 mmol/L — ABNORMAL LOW (ref 98–111)
Creatinine: 0.98 mg/dL (ref 0.61–1.24)
GFR, Est AFR Am: >60 mL/min (ref >60)
Glucose, Bld: 127 mg/dL — ABNORMAL HIGH (ref 70–99)
POTASSIUM: 3.8 mmol/L (ref 3.5–5.1)
Sodium: 135 mmol/L (ref 135–145)
TOTAL PROTEIN: 6.8 g/dL (ref 6.5–8.1)

## 2018-03-22 LAB — CBC WITH DIFFERENTIAL/PLATELET
Basophils Absolute: 0 10*3/uL (ref 0.0–0.1)
Basophils Relative: 0 %
Eosinophils Absolute: 0.1 10*3/uL (ref 0.0–0.5)
Eosinophils Relative: 2 %
HEMATOCRIT: 34.2 % — AB (ref 38.4–49.9)
Hemoglobin: 12.1 g/dL — ABNORMAL LOW (ref 13.0–17.1)
LYMPHS ABS: 0.5 10*3/uL — AB (ref 0.9–3.3)
LYMPHS PCT: 17 %
MCH: 29.6 pg (ref 27.2–33.4)
MCHC: 35.4 g/dL (ref 32.0–36.0)
MCV: 83.6 fL (ref 79.3–98.0)
MONO ABS: 0.4 10*3/uL (ref 0.1–0.9)
MONOS PCT: 15 %
NEUTROS ABS: 1.8 10*3/uL (ref 1.5–6.5)
Neutrophils Relative %: 66 %
Platelets: 139 10*3/uL — ABNORMAL LOW (ref 140–400)
RBC: 4.09 MIL/uL — ABNORMAL LOW (ref 4.20–5.82)
RDW: 15.3 % — AB (ref 11.0–14.6)
WBC: 2.7 10*3/uL — ABNORMAL LOW (ref 4.0–10.3)

## 2018-03-22 LAB — PHOSPHORUS: Phosphorus: 3.8 mg/dL (ref 2.5–4.6)

## 2018-03-22 LAB — MAGNESIUM: MAGNESIUM: 1.5 mg/dL — AB (ref 1.7–2.4)

## 2018-03-22 MED ORDER — LORAZEPAM 2 MG/ML PO CONC: 1.0000 mg | ORAL | 0 refills | Status: DC

## 2018-03-22 MED ORDER — SODIUM CHLORIDE 0.9% FLUSH
10.0000 mL | INTRAVENOUS | Status: DC | PRN
  Administered 2018-03-22: 10 mL
  Filled 2018-03-22: qty 10

## 2018-03-22 MED ORDER — HEPARIN SOD (PORK) LOCK FLUSH 100 UNIT/ML IV SOLN
500.0000 [IU] | Freq: Once | INTRAVENOUS | Status: AC | PRN
  Administered 2018-03-22: 500 [IU]
  Filled 2018-03-22: qty 5

## 2018-03-22 NOTE — Telephone Encounter (Signed)
Scheduled appt per 8/8 los - gave patient AVS and calender per los.  

## 2018-03-23 ENCOUNTER — Inpatient Hospital Stay: Payer: PRIVATE HEALTH INSURANCE | Admitting: Nutrition

## 2018-03-23 ENCOUNTER — Other Ambulatory Visit: Payer: Self-pay

## 2018-03-23 ENCOUNTER — Inpatient Hospital Stay: Payer: PRIVATE HEALTH INSURANCE

## 2018-03-23 ENCOUNTER — Ambulatory Visit
Admission: RE | Admit: 2018-03-23 | Discharge: 2018-03-23 | Disposition: A | Discharge status: Home / Self Care | Payer: PRIVATE HEALTH INSURANCE | Source: Ambulatory Visit | Attending: Radiation Oncology | Admitting: Radiation Oncology

## 2018-03-23 VITALS — BP 103/70 | HR 99 | Temp 98.0°F | Resp 16

## 2018-03-23 DIAGNOSIS — Z95828 Presence of other vascular implants and grafts: Secondary | ICD-10-CM

## 2018-03-23 DIAGNOSIS — Z510 Encounter for antineoplastic radiation therapy: Secondary | ICD-10-CM | POA: Diagnosis not present

## 2018-03-23 DIAGNOSIS — C099 Malignant neoplasm of tonsil, unspecified: Secondary | ICD-10-CM | POA: Diagnosis not present

## 2018-03-23 DIAGNOSIS — E860 Dehydration: Secondary | ICD-10-CM

## 2018-03-23 MED ORDER — SODIUM CHLORIDE 0.9% FLUSH
10.0000 mL | INTRAVENOUS | Status: DC | PRN
Start: 2018-03-23 — End: 2018-03-23
  Administered 2018-03-23: 10 mL
  Filled 2018-03-23: qty 10

## 2018-03-23 MED ORDER — SODIUM CHLORIDE 0.9 % IV SOLN
INTRAVENOUS | Status: DC
  Administered 2018-03-23: 14:00:00 via INTRAVENOUS
  Filled 2018-03-23: qty 250

## 2018-03-23 MED ORDER — ONDANSETRON HCL 4 MG/2ML IJ SOLN
8.0000 mg | Freq: Once | INTRAMUSCULAR | Status: AC
  Administered 2018-03-23: 8 mg via INTRAVENOUS

## 2018-03-23 MED ORDER — SODIUM CHLORIDE 0.9 % IV SOLN: Freq: Once | INTRAVENOUS | Status: DC

## 2018-03-23 MED ORDER — SODIUM CHLORIDE 0.9 % IV SOLN
Freq: Once | INTRAVENOUS | Status: AC
  Administered 2018-03-23: 14:00:00 via INTRAVENOUS
  Filled 2018-03-23: qty 1000

## 2018-03-23 MED ORDER — SODIUM CHLORIDE 0.9 % IV SOLN
INTRAVENOUS | Status: DC
  Filled 2018-03-23: qty 250

## 2018-03-23 MED ORDER — HEPARIN SOD (PORK) LOCK FLUSH 100 UNIT/ML IV SOLN
500.0000 [IU] | Freq: Once | INTRAVENOUS | Status: AC | PRN
Start: 2018-03-23 — End: 2018-03-23
  Administered 2018-03-23: 500 [IU]
  Filled 2018-03-23: qty 5

## 2018-03-23 MED ORDER — ONDANSETRON HCL 4 MG/2ML IJ SOLN
INTRAMUSCULAR | Status: AC
  Filled 2018-03-23: qty 4

## 2018-03-23 MED ORDER — METOCLOPRAMIDE HCL 5 MG/5ML PO SOLN: 5.0000 mg | Freq: Three times a day (TID) | ORAL | 0 refills | Status: DC | PRN

## 2018-03-23 NOTE — Progress Notes (Signed)
Nutrition follow-up completed with patient during IV fluids for tonsil cancer. Patient has completed chemotherapy. He completes radiation therapy on Tuesday, August 13. Current weight is 247.2 pounds which is a 31.8 pound weight loss over 2 months. This is the equivalent of 11% which is significant. Per wife, patient is getting between 3 and 4 bottles of Osmolite 1.5 daily. She reports patient complains of nausea or fullness and so will not agree to more tube feeding. They have been giving a lot of water.  She did not say how much. Patient took a Compazine pill from home prior to IV fluids but had a lot of difficulty swallowing this. They are requesting medications be changed to liquid form. Wife reports they have not picked up new prescription for lorazepam yet.  Estimated nutrition needs: 2400-2600 cal, 135-150 g protein, 2.7 L fluid.  Nutrition diagnosis: Inadequate oral intake continues.  Intervention: Educated patient on the importance of taking nausea medications as prescribed.   Explained the importance of increasing tube feeding and fluids to goal rate. Spoke with MD who is agreeable to providing patient with IV Zofran today. MD will evaluate effectiveness of lorazepam before ordering scopolamine patch. Communication provided to patient and wife. Question patient's need for continuing IV fluids until patient can reach goal rate of tube feeding. I have notified head and neck navigator to communicate the discussion today. Teach back method used.  Monitoring, evaluation, goals: Patient will take medications as prescribed to control nausea.  He will increase tube feeding and free water flushes to goal rate for weight maintenance/weight gain.  Next visit: To be scheduled.  **Disclaimer: This note was dictated with voice recognition software. Similar sounding words can inadvertently be transcribed and this note may contain transcription errors which may not have been corrected upon  publication of note.**

## 2018-03-23 NOTE — Patient Instructions (Signed)
Dehydration, Adult Dehydration is when there is not enough fluid or water in your body. This happens when you lose more fluids than you take in. Dehydration can range from mild to very bad. It should be treated right away to keep it from getting very bad. Symptoms of mild dehydration may include:  Thirst.  Dry lips.  Slightly dry mouth.  Dry, warm skin.  Dizziness. Symptoms of moderate dehydration may include:  Very dry mouth.  Muscle cramps.  Dark pee (urine). Pee may be the color of tea.  Your body making less pee.  Your eyes making fewer tears.  Heartbeat that is uneven or faster than normal (palpitations).  Headache.  Light-headedness, especially when you stand up from sitting.  Fainting (syncope). Symptoms of very bad dehydration may include:  Changes in skin, such as: ? Cold and clammy skin. ? Blotchy (mottled) or pale skin. ? Skin that does not quickly return to normal after being lightly pinched and let go (poor skin turgor).  Changes in body fluids, such as: ? Feeling very thirsty. ? Your eyes making fewer tears. ? Not sweating when body temperature is high, such as in hot weather. ? Your body making very little pee.  Changes in vital signs, such as: ? Weak pulse. ? Pulse that is more than 100 beats a minute when you are sitting still. ? Fast breathing. ? Low blood pressure.  Other changes, such as: ? Sunken eyes. ? Cold hands and feet. ? Confusion. ? Lack of energy (lethargy). ? Trouble waking up from sleep. ? Short-term weight loss. ? Unconsciousness. Follow these instructions at home:  If told by your doctor, drink an ORS: ? Make an ORS by using instructions on the package. ? Start by drinking small amounts, about  cup (120 mL) every 5-10 minutes. ? Slowly drink more until you have had the amount that your doctor said to have.  Drink enough clear fluid to keep your pee clear or pale yellow. If you were told to drink an ORS, finish the ORS  first, then start slowly drinking clear fluids. Drink fluids such as: ? Water. Do not drink only water by itself. Doing that can make the salt (sodium) level in your body get too low (hyponatremia). ? Ice chips. ? Fruit juice that you have added water to (diluted). ? Low-calorie sports drinks.  Avoid: ? Alcohol. ? Drinks that have a lot of sugar. These include high-calorie sports drinks, fruit juice that does not have water added, and soda. ? Caffeine. ? Foods that are greasy or have a lot of fat or sugar.  Take over-the-counter and prescription medicines only as told by your doctor.  Do not take salt tablets. Doing that can make the salt level in your body get too high (hypernatremia).  Eat foods that have minerals (electrolytes). Examples include bananas, oranges, potatoes, tomatoes, and spinach.  Keep all follow-up visits as told by your doctor. This is important. Contact a doctor if:  You have belly (abdominal) pain that: ? Gets worse. ? Stays in one area (localizes).  You have a rash.  You have a stiff neck.  You get angry or annoyed more easily than normal (irritability).  You are more sleepy than normal.  You have a harder time waking up than normal.  You feel: ? Weak. ? Dizzy. ? Very thirsty.  You have peed (urinated) only a small amount of very dark pee during 6-8 hours. Get help right away if:  You have symptoms of   very bad dehydration.  You cannot drink fluids without throwing up (vomiting).  Your symptoms get worse with treatment.  You have a fever.  You have a very bad headache.  You are throwing up or having watery poop (diarrhea) and it: ? Gets worse. ? Does not go away.  You have blood or something green (bile) in your throw-up.  You have blood in your poop (stool). This may cause poop to look black and tarry.  You have not peed in 6-8 hours.  You pass out (faint).  Your heart rate when you are sitting still is more than 100 beats a  minute.  You have trouble breathing. This information is not intended to replace advice given to you by your health care provider. Make sure you discuss any questions you have with your health care provider. Document Released: 05/28/2009 Document Revised: 02/19/2016 Document Reviewed: 09/25/2015 Elsevier Interactive Patient Education  2018 Elsevier Inc.  

## 2018-03-26 ENCOUNTER — Telehealth: Payer: Self-pay | Admitting: *Deleted

## 2018-03-26 ENCOUNTER — Ambulatory Visit: Payer: PRIVATE HEALTH INSURANCE

## 2018-03-26 ENCOUNTER — Other Ambulatory Visit: Payer: Self-pay

## 2018-03-26 MED ORDER — BENZONATATE 100 MG PO CAPS: 100.0000 mg | ORAL_CAPSULE | Freq: Three times a day (TID) | ORAL | 0 refills | Status: DC | PRN

## 2018-03-26 NOTE — Telephone Encounter (Signed)
Oncology Nurse Navigator Documentation  Spoke with patient's wife, informed her Dr. Irene Limbo Rxed Lavella Lemons for cough suppression, issued to [facility], I confirmed with pharmacist Caryl Pina med in stock and can be picked up in an hour.  Also informed later in the morning Dr. Irene Limbo suggested increasing oxycodone dosage to 1.0 mL for further suppression.  Spoke with wife [phone], she indicated first dose of Tessalon provided cough relief for about 30 minutes.  I encouraged additional dose shortly before arrival for 4:00 RT.  She voiced understanding.  [name], RN, BSN Head & Neck Oncology Nurse [place] at [place] [phone]

## 2018-03-26 NOTE — Telephone Encounter (Signed)
Oncology Nurse Navigator Documentation  Rec'd call from patient's wife. She reported:  He has had a persistent, unrelenting productive cough (thich white phlegm) over the weekend, has been unable to sleep, take anything by mouth.    She stated he will be unable to complete the next 2 days of RT without something to suppress cough, requested Rx this AM so he can be treated this afternoon.  She acknowledged he rec'd IVF and IV antiemetic last Friday.  She indicated she does not think he is dehydrated or needs additional IVF, instilled 4 cans Osmolite 1.5 plus 4 8 oz bottles water yesterday, on track for same today. Intervention:  Note routed to Dr. Irene Limbo and RN Erasmo Downer, called Erasmo Downer with above information, indicated I will follow-up with pt per Dr. Grier Mitts guidance.  [name], RN, BSN Head & Neck Oncology Nurse [place] at [place] [phone]

## 2018-03-26 NOTE — Telephone Encounter (Signed)
Oncology Nurse Navigator Documentation  Pt's wife called to report excessive coughing continues despite dose of Tessalon and 1.0 mL oxycodone, pt refusing to come for tmt.  I encouraged his arrival to see Dr. Isidore Moos even if not treated.  She indicated she communicated same but still he refuses.  I asked her to call me if he changes mind.  LINAC 3, Dr. Isidore Moos and RN Anderson Malta notified.  [name], RN, BSN Head & Neck Oncology Nurse [place] at [place] [phone]

## 2018-03-27 ENCOUNTER — Ambulatory Visit: Payer: PRIVATE HEALTH INSURANCE

## 2018-03-27 ENCOUNTER — Ambulatory Visit
Admission: RE | Admit: 2018-03-27 | Discharge: 2018-03-27 | Disposition: A | Discharge status: Home / Self Care | Payer: PRIVATE HEALTH INSURANCE | Source: Ambulatory Visit | Attending: Radiation Oncology | Admitting: Radiation Oncology

## 2018-03-27 ENCOUNTER — Telehealth: Payer: Self-pay | Admitting: *Deleted

## 2018-03-27 DIAGNOSIS — Z510 Encounter for antineoplastic radiation therapy: Secondary | ICD-10-CM | POA: Diagnosis not present

## 2018-03-27 NOTE — Telephone Encounter (Signed)
Oncology Nurse Navigator Documentation  Rec'd call from Mr. Bullinger' dtr, Merleen Nicely:  Asked if his RT could be moved up from 4:00, sometime between 1:00-3:00.  She stated coughing has subsided since taking several additional doses of Tessalon.  He will take next dose shortly prior to [facility] arrival.  I confirmed with [facility] 3 RTT [place] he can be treated upon his arrival, communicated same to dtr.  [name], RN, BSN Head & Neck Oncology Nurse [place] at [place] [phone]

## 2018-03-28 ENCOUNTER — Ambulatory Visit
Admission: RE | Admit: 2018-03-28 | Discharge: 2018-03-28 | Disposition: A | Discharge status: Home / Self Care | Payer: PRIVATE HEALTH INSURANCE | Source: Ambulatory Visit | Attending: Radiation Oncology | Admitting: Radiation Oncology

## 2018-03-28 ENCOUNTER — Encounter: Payer: Self-pay | Admitting: *Deleted

## 2018-03-28 ENCOUNTER — Other Ambulatory Visit: Payer: Self-pay | Admitting: Hematology

## 2018-03-28 DIAGNOSIS — Z510 Encounter for antineoplastic radiation therapy: Secondary | ICD-10-CM | POA: Diagnosis not present

## 2018-03-28 DIAGNOSIS — C090 Malignant neoplasm of tonsillar fossa: Secondary | ICD-10-CM

## 2018-03-28 MED ORDER — SONAFINE EX EMUL
1.0000 "application " | Freq: Once | CUTANEOUS | Status: AC
  Administered 2018-03-28: 1 via TOPICAL

## 2018-03-28 MED ORDER — OXYCODONE HCL 20 MG/ML PO CONC: 10.0000 mg | ORAL | 0 refills | Status: DC | PRN

## 2018-03-28 NOTE — Progress Notes (Signed)
Oncology Nurse Navigator Documentation  Met with Travis Mcdonald during final RT to offer support and to celebrate end of radiation treatment.  He was accompanied by his daughter. Provided her Certificate of Recognition for her supportive care, certificates for his wife and granddaughter. Provided verbal/written post-RT guidance:  Importance of keeping all follow-up appts, especially those with Nutrition and SLP.  Importance of protecting treatment area from sun.  Continuation of Sonafine application 2-3 times daily until supply exhausted after which transition to OTC lotion with vitamin E. Explained my role as navigator will continue for several more months and that I will be calling and/or joining him during follow-up visits.   I encouraged him to call me with needs/concerns.   Travis Mcdonald and his dtr verbalized understanding of information provided.  [name], RN, BSN Head & Neck Oncology [place] at [place] [phone]

## 2018-03-30 ENCOUNTER — Encounter: Payer: Self-pay | Admitting: Radiation Oncology

## 2018-03-30 NOTE — Progress Notes (Signed)
  Radiation Oncology         [phone] ________________________________  Name: Travis Mcdonald MRN: [id number]  Date: 03/30/2018  DOB: 1965-10-17  End of Treatment Note  Diagnosis:   Cancer Staging Carcinoma of tonsillar fossa ([place]) Staging form: Pharynx - HPV-Mediated Oropharynx, AJCC 8th Edition - Clinical: Stage I (cT1, cN1, cM0, p16: Not Assessed, HPV: Not assessed) - Signed by Eppie Gibson, MD on 01/17/2018      Indication for treatment:  Curative       Radiation treatment dates:   02/05/2018-03/28/2018  Site/dose:   1. Left tonsil and bilateral neck, 2 Gy x 35 fractions for a total dose of 70 Gy  Beams/energy:   1. IMRT, 6X  Narrative: The patient tolerated radiation treatment with concurrent chemotherapy relatively well. Given sonfaine and provided with instructions regarding its use.  Towards the end of his treatment, he reported mouth pain (relieved by liquid oxycodone), no PO intake (instilling 4-5 cartons of nutritional supplement daily and 4-5 16 ounce water bottles daily), and dry/peeling radiation site. He was noted to be using neosporing and sonafine to these areas. On PE, it was noted no oral thrush, limited mucositis without bleeding; left neck mass still palpable; dry peeling on neck, weight loss.  Supportive care was provided in medical oncology including IV fluids.  Plan: The patient has completed radiation treatment. The patient will return to radiation oncology clinic for routine followup in one half month. I advised them to call or return sooner if they have any questions or concerns related to their recovery or treatment.  -----------------------------------  Eppie Gibson, MD  This document serves as a record of services personally performed by Eppie Gibson, MD. It was created on her behalf by Steva Colder, a trained medical scribe. The creation of this record is based on the scribe's personal observations and the provider's statements to them. This document has  been checked and approved by the attending provider.

## 2018-04-02 ENCOUNTER — Telehealth: Payer: Self-pay | Admitting: *Deleted

## 2018-04-02 ENCOUNTER — Inpatient Hospital Stay: Payer: PRIVATE HEALTH INSURANCE

## 2018-04-02 ENCOUNTER — Ambulatory Visit: Payer: PRIVATE HEALTH INSURANCE

## 2018-04-02 ENCOUNTER — Ambulatory Visit: Payer: Self-pay

## 2018-04-02 NOTE — Telephone Encounter (Signed)
Oncology Nurse Navigator Documentation  Rec'd call from pt's wife cancelling today's 1:30 SLP appt at [facility] b/c transportation unavailable.  She will get back to me re his attendance at next Tues morning's H&N [place].  SLP Garald Balding and his scheduler notified; appt cancelled in [place].  [name], RN, BSN Head & Neck Oncology Nurse [place] at [place] [phone]

## 2018-04-05 NOTE — Progress Notes (Signed)
HEMATOLOGY/ONCOLOGY CLINIC NOTE  Date of Service: 04/06/2018  Patient Care Team: Celene Squibb, MD as PCP - General (Internal Medicine) Rozetta Nunnery, MD as Consulting Physician (Otolaryngology) Eppie Gibson, MD as Attending Physician (Radiation Oncology) Brunetta Genera, MD as Consulting Physician (Hematology) Leota Sauers, RN as Oncology Nurse Navigator Karie Mainland, RD as Dietitian (Nutrition) Valentino Saxon [place], CCC-SLP as Speech Language Pathologist (Speech Pathology) Wynelle Beckmann, Melodie Bouillon, PT as Physical Therapist (Physical Therapy) Kennith Center, LCSW as Social Worker  CHIEF COMPLAINTS/PURPOSE OF CONSULTATION:  F/u for Tonsillar carcinoma   HISTORY OF PRESENTING ILLNESS:   Travis Mcdonald is a wonderful 52 y.o. 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 He is accompanied today by his wife. The pt reports that he is doing well overall.   The pt reports first noticing an enlargement of his left neck around January 2019. He believed these to be sinus related since he took sinus medications which decreased the swelling. He notes aggravating pain but denies significant pain or problems swallowing. He reports left ear and jaw pain as well.   Four teeth extraction on 01/19/18 with radiation planning this afternoon and tentative radiation beginning 02/05/18.   He denies any ear infection concerns after his audiogram. He denies recurrent ear infections but notes that he had two ear infections last year.    The pt notes that he will try to continue work each day.  He adds that he has a skin rash on his right upper leg which is recurrent in the summers. He denies seeing a dermatologist in the past and treats the associated itching with cortisone and benadryl.   He notes that he does snore at night and his wife notes that he does not snore when he sleeps on his side.   Of note prior to the patient's  visit today, pt has had PET/CT completed on 01/04/18 with results revealing Extensive hypermetabolic activity in the left neck, index lesion maximum SUV 20.3. Looking for a primary lesion in the neck, there is some very faint asymmetry of the left palatine tonsillar pillar which could conceivably represent a small lesion. 2. No significant abnormal activity in the chest, abdomen/pelvis, or skeleton. 3. Other imaging findings of potential clinical significance: Aortic Atherosclerosis (ICD10-I70.0). Coronary atherosclerosis. Small mucous retention cyst in left maxillary sinus. Diffuse hepatic steatosis. Nonspecific but probably benign hypodense lesions in the lateral segment left hepatic lobe.   Most recent lab results (01/24/18) of CBC w/diff, and CMP  is as follows: all values are WNL except glucose at 162. Magnesium 01/24/18 is WNL at 1.8 Phosphorus 01/24/18 is WNL at 2.7  On review of systems, pt reports left ear pain, left neck discomfort, left jaw pain, upper right leg rash, and denies abdominal pains, other skin rashes, and any other symptoms.   On PMHx the pt reports 1998 and 1999 back surgeries for ruptured disks with microdiscectomy, high blood pressure, denies diabetes, heart, lung or kidney problems. 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 Denies ever smoking cigarettes. Denies ETOH consumption in 30 years. Reports infrequent marijuana smoking 30 years ago.  On Family Hx the pt reports maternal cousin with brain cancer, maternal cousin with pancreatic cancer, maternal uncle with cancer. Denies much paternal knowledge.   Interval History:   Travis Mcdonald returns today regarding his tonsillar carcinoma. The patient's last visit with [place] was  on 03/22/18. He is accompanied today by his daughter. The pt reports that he is doing well overall. The pt completed RT on 03/27/18.   The pt reports that he is constipated and hasn't used Senna S yet, but is using  Miralax through his tube. He notes that his voice has gained some strength and he is not needing to take liquid Hydrocodone. His throat pain has settled down quite a bit. He notes that when he swallows water he feels a burning sensation which is exacerbated with cold water.   The pt notes that he is consuming 3-4 bottles through his feeding tube, though has been calculated to use 7 bottles a day. He notes that his constipation has been limiting.   The pt notes that his week has been better than last week overall, being able to increase his activity levels and noting the resolution of his neck/throat pain.   Lab results today (04/06/18) of CBC w/diff, CMP is as follows: all values are WNL except for RBC at 3.93, HGB at 11.5, HCT at 34.1, RDW at 19.6, Potassium at 3.2, Chloride at 94, CO2 at 35, BUN at 23, Albumin at 3.3, ALT at 201. Magnesium 04/06/18 is WNL at 1.9 Phosphorous 04/06/18 is WNL at 3.1   On review of systems, pt reports improving voice, constipation, feelings of fullness, burning sensation when swallowing, some cough, and denies throat pain, neck pain, leg swelling, nausea, vomiting, and any other symptoms.    MEDICAL HISTORY:  Past Medical History:  Diagnosis Date  . Hypertension     SURGICAL HISTORY: Past Surgical History:  Procedure Laterality Date  . [place]   ruptured lower back disc surgery. L4 & L5 Lumber disc  . IR GASTROSTOMY TUBE MOD SED  02/02/2018  . IR IMAGING GUIDED PORT INSERTION  02/02/2018    SOCIAL HISTORY: Social History   Socioeconomic History  . Marital status: Married    Spouse name: Not on file  . Number of children: Not on file  . Years of education: Not on file  . Highest education level: Not on file  Occupational History  . Not on file  Social Needs  . Financial resource strain: Not on file  . Food insecurity:    Worry: Not on file    Inability: Not on file  . Transportation needs:    Medical: Not on file    Non-medical:  Not on file  Tobacco Use  . Smoking status: Never Smoker  . Smokeless tobacco: Never Used  Substance and Sexual Activity  . Alcohol use: No    Comment: no alcohol for 30 years.   . Drug use: No  . Sexual activity: Not on file  Lifestyle  . Physical activity:    Days per week: Not on file    Minutes per session: Not on file  . Stress: Not on file  Relationships  . Social connections:    Talks on phone: Not on file    Gets together: Not on file    Attends religious service: Not on file    Active member of club or organization: Not on file    Attends meetings of clubs or organizations: Not on file    Relationship status: Not on file  . Intimate partner violence:    Fear of current or ex partner: Not on file    Emotionally abused: Not on file    Physically abused: Not on file    Forced sexual activity: Not  on file  Other Topics Concern  . Not on file  Social History Narrative  . Not on file    FAMILY HISTORY: No family history on file.  ALLERGIES:  has No Known Allergies.  MEDICATIONS:  Current Outpatient Medications  Medication Sig Dispense Refill  . benzonatate (TESSALON) 100 MG capsule Take 1 capsule (100 mg total) by mouth 3 (three) times daily as needed for cough. 20 capsule 0  . clotrimazole (LOTRIMIN AF) 1 % cream Apply 1 application topically 2 (two) times daily. To rash on rt leg 60 g 0  . dexamethasone (DECADRON) 4 MG tablet Take 2 tablets by mouth once a day on the day after chemotherapy and then take 2 tablets two times a day for 2 days. Take with food. 30 tablet 1  . enalapril-hydrochlorothiazide (VASERETIC) 10-25 MG tablet Take 1 tablet by mouth daily.     Marland Kitchen lidocaine (XYLOCAINE) 2 % solution Patient: Mix 1part 2% viscous lidocaine, 1part H20. Swish & swallow 109mL of diluted mixture, 75min before meals and at bedtime, up to QID 100 mL 5  . lidocaine-prilocaine (EMLA) cream Apply to affected area once 30 g 3  . LORazepam (LORAZEPAM INTENSOL) 2 MG/ML  concentrated solution Take 0.5 mLs (1 mg total) by mouth as directed. 30 mins prior to radiation 15 mL 0  . metoCLOPramide (REGLAN) 5 MG/5ML solution Place 5 mLs (5 mg total) into feeding tube 3 (three) times daily with meals as needed for nausea or vomiting. Instead of Compazine. 120 mL 0  . Nutritional Supplements (FEEDING SUPPLEMENT, OSMOLITE 1.5 CAL,) LIQD Begin 1 carton Osmolite 1.5 via PEG QID with 120 mL free water before and after. Increase to 1 1/2 cartons on Thursday as tolerated. Increase to 2 cartons bid and 1 1/2 cartons bid on Saturday. Flush tube with additional 240 mL free water TID between feedings. 7 Bottle 0  . ondansetron (ZOFRAN) 8 MG tablet Take 1 tablet (8 mg total) by mouth 2 (two) times daily as needed. Start on the third day after chemotherapy. 30 tablet 1  . oxyCODONE (ROXICODONE INTENSOL) 20 MG/ML concentrated solution Take 0.5 mLs (10 mg total) by mouth every 4 (four) hours as needed for severe pain or breakthrough pain. 30 mL 0  . senna-docusate (SENNA S) 8.6-50 MG tablet Take 2 tablets by mouth at bedtime as needed for mild constipation or moderate constipation. 60 tablet 1  . sodium fluoride (FLUORISHIELD) 1.1 % GEL dental gel Instill one drop of gel per tooth space of fluoride tray. Place over teeth for 5 minutes. Remove. Spit out excess. Repeat nightly. 120 mL prn   No current facility-administered medications for this visit.     REVIEW OF SYSTEMS:    A 10+ POINT REVIEW OF SYSTEMS WAS OBTAINED including neurology, dermatology, psychiatry, cardiac, respiratory, lymph, extremities, GI, GU, Musculoskeletal, constitutional, breasts, reproductive, HEENT.  All pertinent positives are noted in the HPI.  All others are negative.    PHYSICAL EXAMINATION: ECOG PERFORMANCE STATUS: 1 - Symptomatic but completely ambulatory  VS reviewed  GENERAL:alert, in no acute distress and comfortable SKIN: no acute rashes, no significant lesions EYES: conjunctiva are pink and  non-injected, sclera anicteric OROPHARYNX: MMM, no exudates, no oropharyngeal erythema or ulceration NECK: supple, no JVD LYMPH:  no palpable lymphadenopathy in the cervical, axillary or inguinal regions LUNGS: clear to auscultation b/l with normal respiratory effort. Port a cath in situ HEART: regular rate & rhythm ABDOMEN:  normoactive bowel sounds , non tender, not distended. No palpable  hepatosplenomegaly. Peg tube in situ. Extremity: no pedal edema PSYCH: alert & oriented x 3 with fluent speech NEURO: no focal motor/sensory deficits    LABORATORY DATA:  I have reviewed the data as listed  . CBC Latest Ref Rng & Units 04/06/2018 03/22/2018 03/14/2018  WBC 4.0 - 10.3 K/uL 6.5 2.7(L) 2.8(L)  Hemoglobin 13.0 - 17.1 g/dL 11.5(L) 12.1(L) 12.7(L)  Hematocrit 38.4 - 49.9 % 34.1(L) 34.2(L) 36.9(L)  Platelets 140 - 400 K/uL 345 139(L) 110(L)   . CBC    Component Value Date/Time   WBC 6.5 04/06/2018 1139   RBC 3.93 (L) 04/06/2018 1139   HGB 11.5 (L) 04/06/2018 1139   HGB 16.6 02/12/2018 1341   HCT 34.1 (L) 04/06/2018 1139   PLT 345 04/06/2018 1139   PLT 250 02/12/2018 1341   MCV 86.7 04/06/2018 1139   MCH 29.2 04/06/2018 1139   MCHC 33.7 04/06/2018 1139   RDW 19.6 (H) 04/06/2018 1139   LYMPHSABS 0.9 04/06/2018 1139   MONOABS 0.7 04/06/2018 1139   EOSABS 0.1 04/06/2018 1139   BASOSABS 0.1 04/06/2018 1139    . CMP Latest Ref Rng & Units 04/06/2018 03/22/2018 03/14/2018  Glucose 70 - 99 mg/dL 92 127(H) 160(H)  BUN 6 - 20 mg/dL 23(H) 16 17  Creatinine 0.61 - 1.24 mg/dL 1.16 0.98 0.98  Sodium 135 - 145 mmol/L 139 135 137  Potassium 3.5 - 5.1 mmol/L 3.2(L) 3.8 3.8  Chloride 98 - 111 mmol/L 94(L) 93(L) 97(L)  CO2 22 - 32 mmol/L 35(H) 30 31  Calcium 8.9 - 10.3 mg/dL 9.6 9.4 9.4  Total Protein 6.5 - 8.1 g/dL 7.0 6.8 6.5  Total Bilirubin 0.3 - 1.2 mg/dL 0.6 0.7 0.4  Alkaline Phos 38 - 126 U/L 86 84 77  AST 15 - 41 U/L 28 13(L) 12(L)  ALT 0 - 44 U/L 201(H) 28 28   11/10/17  Comprehensive Hearing Test:    12/27/17 Surgical Pathology:    RADIOGRAPHIC STUDIES: I have personally reviewed the radiological images as listed and agreed with the findings in the report. No results found.  ASSESSMENT & PLAN:   52 y.o. 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 Will track liver functions.  -Ordering Liquid potassium replacement  -One time dose of magnesium citrate through tube, up to 200cc -Begin using Senna S BID, crushing and taking through tube -Stressed the importance of increasing hydration status -Continue walking around at least 20-30 minutes each day -Increase tube feeding to 7 cans as calculated  -Will set pt up for IVF two times a week if pt is unable to increase his hydration status  -Continue Salt/baking soda mouthwash 4-5 times a day  -Will see the pt back in 2 weeks with labs, sooner if any new concerns   2. Eczematoid skin rash on his lower extremities RT>left. Resolved with anti fungal - fluconazole. Plan -completed po fluconazole with near resolution of rash -conitnue topical anti-fungals . -Continue moisturizing previously fungal-affected skin areas on bilateral LE, use colloidal silver soap for anti-fungal  prevention    RTC with Dr Irene Limbo in 2weeks with labs Port flush with each lab appointment (Lab draw from port)    All of the patients questions were answered with apparent satisfaction. The patient knows to call the clinic with any problems, questions  or concerns.  The total time spent in the appt was 20 minutes and more than 50% was on counseling and direct patient cares.     Sullivan Lone MD MS AAHIVMS [facility] Hematology/Oncology Physician [facility]  (Office):       [phone] (Work cell):  [phone] (Fax):           [phone]  04/06/2018 12:58 PM  I, Baldwin [place], am acting as a scribe for Dr. Irene Limbo  .I have reviewed the above documentation for accuracy and completeness, and I agree with the above. Brunetta Genera MD

## 2018-04-06 ENCOUNTER — Inpatient Hospital Stay: Payer: PRIVATE HEALTH INSURANCE

## 2018-04-06 ENCOUNTER — Inpatient Hospital Stay (HOSPITAL_BASED_OUTPATIENT_CLINIC_OR_DEPARTMENT_OTHER): Payer: PRIVATE HEALTH INSURANCE | Admitting: Hematology

## 2018-04-06 VITALS — BP 123/81 | HR 94 | Temp 97.8°F | Resp 18 | Ht 74.0 in | Wt 238.8 lb

## 2018-04-06 DIAGNOSIS — K1230 Oral mucositis (ulcerative), unspecified: Secondary | ICD-10-CM | POA: Diagnosis not present

## 2018-04-06 DIAGNOSIS — K5900 Constipation, unspecified: Secondary | ICD-10-CM

## 2018-04-06 DIAGNOSIS — E860 Dehydration: Secondary | ICD-10-CM

## 2018-04-06 DIAGNOSIS — Y842 Radiological procedure and radiotherapy as the cause of abnormal reaction of the patient, or of later complication, without mention of misadventure at the time of the procedure: Secondary | ICD-10-CM

## 2018-04-06 DIAGNOSIS — C099 Malignant neoplasm of tonsil, unspecified: Secondary | ICD-10-CM

## 2018-04-06 DIAGNOSIS — Z923 Personal history of irradiation: Secondary | ICD-10-CM

## 2018-04-06 DIAGNOSIS — Z931 Gastrostomy status: Secondary | ICD-10-CM

## 2018-04-06 DIAGNOSIS — C090 Malignant neoplasm of tonsillar fossa: Secondary | ICD-10-CM

## 2018-04-06 DIAGNOSIS — R21 Rash and other nonspecific skin eruption: Secondary | ICD-10-CM

## 2018-04-06 DIAGNOSIS — K1233 Oral mucositis (ulcerative) due to radiation: Secondary | ICD-10-CM

## 2018-04-06 DIAGNOSIS — E876 Hypokalemia: Secondary | ICD-10-CM

## 2018-04-06 LAB — CBC WITH DIFFERENTIAL/PLATELET
BASOS PCT: 1 %
Basophils Absolute: 0.1 10*3/uL (ref 0.0–0.1)
EOS ABS: 0.1 10*3/uL (ref 0.0–0.5)
Eosinophils Relative: 1 %
HCT: 34.1 % — ABNORMAL LOW (ref 38.4–49.9)
Hemoglobin: 11.5 g/dL — ABNORMAL LOW (ref 13.0–17.1)
LYMPHS ABS: 0.9 10*3/uL (ref 0.9–3.3)
Lymphocytes Relative: 14 %
MCH: 29.2 pg (ref 27.2–33.4)
MCHC: 33.7 g/dL (ref 32.0–36.0)
MCV: 86.7 fL (ref 79.3–98.0)
MONOS PCT: 11 %
Monocytes Absolute: 0.7 10*3/uL (ref 0.1–0.9)
NEUTROS PCT: 73 %
Neutro Abs: 4.8 10*3/uL (ref 1.5–6.5)
Platelets: 345 10*3/uL (ref 140–400)
RBC: 3.93 MIL/uL — AB (ref 4.20–5.82)
RDW: 19.6 % — ABNORMAL HIGH (ref 11.0–14.6)
WBC: 6.5 10*3/uL (ref 4.0–10.3)

## 2018-04-06 LAB — CMP (CANCER CENTER ONLY)
ALT: 201 U/L — ABNORMAL HIGH (ref 0–44)
ANION GAP: 10 (ref 5–15)
AST: 28 U/L (ref 15–41)
Albumin: 3.3 g/dL — ABNORMAL LOW (ref 3.5–5.0)
Alkaline Phosphatase: 86 U/L (ref 38–126)
BUN: 23 mg/dL — ABNORMAL HIGH (ref 6–20)
CHLORIDE: 94 mmol/L — AB (ref 98–111)
CO2: 35 mmol/L — AB (ref 22–32)
Calcium: 9.6 mg/dL (ref 8.9–10.3)
Creatinine: 1.16 mg/dL (ref 0.61–1.24)
GFR, Estimated: >60 mL/min (ref >60)
Glucose, Bld: 92 mg/dL (ref 70–99)
Potassium: 3.2 mmol/L — ABNORMAL LOW (ref 3.5–5.1)
SODIUM: 139 mmol/L (ref 135–145)
Total Bilirubin: 0.6 mg/dL (ref 0.3–1.2)
Total Protein: 7 g/dL (ref 6.5–8.1)

## 2018-04-06 LAB — MAGNESIUM: MAGNESIUM: 1.9 mg/dL (ref 1.7–2.4)

## 2018-04-06 LAB — PHOSPHORUS: PHOSPHORUS: 3.1 mg/dL (ref 2.5–4.6)

## 2018-04-06 MED ORDER — POTASSIUM CHLORIDE 20 MEQ PO PACK: 20.0000 meq | PACK | Freq: Every day | ORAL | 0 refills | Status: DC

## 2018-04-09 ENCOUNTER — Telehealth: Payer: Self-pay | Admitting: *Deleted

## 2018-04-09 NOTE — Telephone Encounter (Signed)
Called patient to inform of fu being moved from 04-17-18 to 04-18-18 @ 3:30 pm, lvm for a return call

## 2018-04-11 NOTE — Progress Notes (Addendum)
  Mr. Travis Mcdonald presents for follow up of radiation completed 03/28/18 to his Left tonsil.   Pain issues, if any: Yes, he reports pain to his throat which is a 9/10 at times. It gets worse as the day progresses. He also has left ear pain which has been chronic since diagnosis.  Using a feeding tube?: Yes, 5-6 cartons of nutritional supplement daily Weight changes, if any:  Wt Readings from Last 3 Encounters:  04/18/18 239 lb (108.4 kg)  04/06/18 238 lb 12.8 oz (108.3 kg)  03/22/18 247 lb 3.2 oz (112.1 kg)   Swallowing issues, if any: He is able to drink an ensure in the morning before his throat starts hurting him too much. He is able to drink water during the day. He has tried a milkshake and pudding that he was unable to swallow.  Smoking or chewing tobacco? No Using fluoride trays daily? Not daily. He is using 1-2 times weekly.  Last ENT visit was on: Not since diagnosis Other notable issues, if any:  Dr. Irene Limbo last on 04/06/18. He is scheduled to see him again on 04/26/18 Dr. Enrique Sack 04/24/18  BP 115/77 (BP Location: Right Arm, Patient Position: Sitting)   Pulse 91   Temp 97.9 F (36.6 C) (Oral)   Resp 20   Ht 6\' 2"  (1.88 m)   Wt 239 lb (108.4 kg)   SpO2 99%   BMI 30.69 kg/m    Wt Readings from Last 3 Encounters:  04/18/18 239 lb (108.4 kg)  04/06/18 238 lb 12.8 oz (108.3 kg)  03/22/18 247 lb 3.2 oz (112.1 kg)

## 2018-04-17 ENCOUNTER — Ambulatory Visit: Payer: Self-pay | Admitting: Radiation Oncology

## 2018-04-18 ENCOUNTER — Encounter: Payer: Self-pay | Admitting: Radiation Oncology

## 2018-04-18 ENCOUNTER — Encounter: Payer: Self-pay | Admitting: *Deleted

## 2018-04-18 ENCOUNTER — Ambulatory Visit
Admission: RE | Admit: 2018-04-18 | Discharge: 2018-04-18 | Disposition: A | Discharge status: Home / Self Care | Payer: PRIVATE HEALTH INSURANCE | Source: Ambulatory Visit | Attending: Radiation Oncology | Admitting: Radiation Oncology

## 2018-04-18 ENCOUNTER — Other Ambulatory Visit: Payer: Self-pay

## 2018-04-18 DIAGNOSIS — H9202 Otalgia, left ear: Secondary | ICD-10-CM | POA: Diagnosis not present

## 2018-04-18 DIAGNOSIS — Z79899 Other long term (current) drug therapy: Secondary | ICD-10-CM | POA: Insufficient documentation

## 2018-04-18 DIAGNOSIS — C090 Malignant neoplasm of tonsillar fossa: Secondary | ICD-10-CM

## 2018-04-18 DIAGNOSIS — Z931 Gastrostomy status: Secondary | ICD-10-CM | POA: Insufficient documentation

## 2018-04-20 ENCOUNTER — Other Ambulatory Visit: Payer: Self-pay | Admitting: Radiation Oncology

## 2018-04-20 ENCOUNTER — Encounter: Payer: Self-pay | Admitting: Radiation Oncology

## 2018-04-20 DIAGNOSIS — C090 Malignant neoplasm of tonsillar fossa: Secondary | ICD-10-CM

## 2018-04-20 DIAGNOSIS — R634 Abnormal weight loss: Secondary | ICD-10-CM

## 2018-04-20 NOTE — Progress Notes (Signed)
Radiation Oncology         [phone] ________________________________  Name: Travis Mcdonald MRN: [id number]  Date: 04/18/2018  DOB: Apr 07, 1966  Follow-Up Visit Note  CC: Celene Squibb, MD  Rozetta Nunnery, *  Diagnosis and Prior Radiotherapy:    Cancer Staging Carcinoma of tonsillar fossa ([place]) Staging form: Pharynx - HPV-Mediated Oropharynx, AJCC 8th Edition - Clinical: Stage I (cT1, cN1, cM0, p16: Not Assessed, HPV: Not assessed) - Signed by Eppie Gibson, MD on 01/17/2018     ICD-10-CM   1. Carcinoma of tonsillar fossa ([place]) C09.0    02/05/2018-03/28/2018 Left tonsil and neck bilaterally, 2 Gy x 35 fractions for a total dose of 70 Gy  CHIEF COMPLAINT:  Here for follow-up and surveillance of tonsil cancer  Narrative:  The patient returns today for routine follow-up.   He reports pain to his throat which is a 9/10 at times. It gets worse as the day progresses. He also has left ear pain which has been chronic since diagnosis.  Using a feeding tube?: Yes, 5-6 cartons of nutritional supplement daily Weight changes, if any:  Wt Readings from Last 3 Encounters:  04/18/18 239 lb (108.4 kg)  04/06/18 238 lb 12.8 oz (108.3 kg)  03/22/18 247 lb 3.2 oz (112.1 kg)   Swallowing issues, if any: He is able to drink an ensure in the morning before his throat starts hurting him too much. He is able to drink water during the day. He has tried a milkshake and pudding that he was unable to swallow.  Smoking or chewing tobacco? No Using fluoride trays daily? Not daily. He is using 1-2 times weekly.  Last ENT visit was on: Not since diagnosis Other notable issues, if any:  Dr. Irene Limbo last on 04/06/18. He is scheduled to see him again on 04/26/18 Dr. Enrique Sack 04/24/18   ALLERGIES:  has No Known Allergies.  Meds: Current Outpatient Medications  Medication Sig Dispense Refill  . clotrimazole (LOTRIMIN AF) 1 % cream Apply 1 application topically 2 (two) times daily. To rash on rt leg 60 g 0    . enalapril-hydrochlorothiazide (VASERETIC) 10-25 MG tablet Take 1 tablet by mouth daily.     Marland Kitchen lidocaine-prilocaine (EMLA) cream Apply to affected area once 30 g 3  . Nutritional Supplements (FEEDING SUPPLEMENT, OSMOLITE 1.5 CAL,) LIQD Begin 1 carton Osmolite 1.5 via PEG QID with 120 mL free water before and after. Increase to 1 1/2 cartons on Thursday as tolerated. Increase to 2 cartons bid and 1 1/2 cartons bid on Saturday. Flush tube with additional 240 mL free water TID between feedings. 7 Bottle 0  . senna-docusate (SENNA S) 8.6-50 MG tablet Take 2 tablets by mouth at bedtime as needed for mild constipation or moderate constipation. 60 tablet 1  . sodium fluoride (FLUORISHIELD) 1.1 % GEL dental gel Instill one drop of gel per tooth space of fluoride tray. Place over teeth for 5 minutes. Remove. Spit out excess. Repeat nightly. 120 mL prn  . benzonatate (TESSALON) 100 MG capsule Take 1 capsule (100 mg total) by mouth 3 (three) times daily as needed for cough. (Patient not taking: Reported on 04/18/2018) 20 capsule 0  . dexamethasone (DECADRON) 4 MG tablet Take 2 tablets by mouth once a day on the day after chemotherapy and then take 2 tablets two times a day for 2 days. Take with food. (Patient not taking: Reported on 04/18/2018) 30 tablet 1  . lidocaine (XYLOCAINE) 2 % solution Patient: Mix 1part 2%  viscous lidocaine, 1part H20. Swish & swallow 75mL of diluted mixture, 47min before meals and at bedtime, up to QID (Patient not taking: Reported on 04/18/2018) 100 mL 5  . LORazepam (LORAZEPAM INTENSOL) 2 MG/ML concentrated solution Take 0.5 mLs (1 mg total) by mouth as directed. 30 mins prior to radiation (Patient not taking: Reported on 04/18/2018) 15 mL 0  . metoCLOPramide (REGLAN) 5 MG/5ML solution Place 5 mLs (5 mg total) into feeding tube 3 (three) times daily with meals as needed for nausea or vomiting. Instead of Compazine. (Patient not taking: Reported on 04/18/2018) 120 mL 0  . ondansetron (ZOFRAN) 8  MG tablet Take 1 tablet (8 mg total) by mouth 2 (two) times daily as needed. Start on the third day after chemotherapy. (Patient not taking: Reported on 04/18/2018) 30 tablet 1  . oxyCODONE (ROXICODONE INTENSOL) 20 MG/ML concentrated solution Take 0.5 mLs (10 mg total) by mouth every 4 (four) hours as needed for severe pain or breakthrough pain. (Patient not taking: Reported on 04/18/2018) 30 mL 0  . potassium chloride (KLOR-CON) 20 MEQ packet Place 20 mEq into feeding tube daily. Mixed in 165ml of water (Patient not taking: Reported on 04/18/2018) 10 packet 0   No current facility-administered medications for this encounter.     Physical Findings: The patient is in no acute distress. Patient is alert and oriented. Wt Readings from Last 3 Encounters:  04/18/18 239 lb (108.4 kg)  04/06/18 238 lb 12.8 oz (108.3 kg)  03/22/18 247 lb 3.2 oz (112.1 kg)    height is 6\' 2"  (1.88 m) and weight is 239 lb (108.4 kg). His oral temperature is 97.9 F (36.6 C). His blood pressure is 115/77 and his pulse is 91. His respiration is 20 and oxygen saturation is 99%. .  General: Alert and oriented, in no acute distress HEENT: Head is normocephalic. Extraocular movements are intact. Oropharynx is notable for healing mucosa, no visible tumor.Left TM is erythematous, some cerumen obstructs view. Neck: Neck is notable for persistent mass in left  Neck level 2/3, much flatter than at diagnosis, but still 3-4 finger breaths in dimension Skin: Skin in treatment fields shows healing with residual darkness, dryness Lymphatics: see Neck Exam Psychiatric: Judgment and insight are intact. Affect is appropriate.   Lab Findings: Lab Results  Component Value Date   WBC 6.5 04/06/2018   HGB 11.5 (L) 04/06/2018   HCT 34.1 (L) 04/06/2018   MCV 86.7 04/06/2018   PLT 345 04/06/2018    Lab Results  Component Value Date   TSH 1.419 01/24/2018    Radiographic Findings: No results found.  Impression/Plan:    1) Head and  Neck Cancer Status:healing from ChRT  2) Nutritional Status: stabilizing PEG tube: using Continue seeing dietician  3) Risk Factors: The patient has been educated about risk factors including alcohol and tobacco abuse; they understand that avoidance of alcohol and tobacco is important to prevent recurrences as well as other cancers  4) Swallowing: continued issues, continue SLP  5) Dental: Encouraged to continue regular followup with dentistry, and dental hygiene including fluoride rinses. See Dr Enrique Sack on 9-10  6) Thyroid function: check annually Lab Results  Component Value Date   TSH 1.419 01/24/2018    7) Other: vit E lotion/oil for dry skin Offered ENT referral for ear pain -it is stable since diagnosis - patient wishes to hold off on another appt and see if this improves with healing from RT and sore throat improvement.   8) Follow-up  in 3 months. The patient was encouraged to call with any issues or questions before then. Will obtain PET scan before that visit.   _____________________________________   Eppie Gibson, MD

## 2018-04-23 ENCOUNTER — Telehealth: Payer: Self-pay | Admitting: *Deleted

## 2018-04-23 ENCOUNTER — Telehealth: Payer: Self-pay | Admitting: Radiation Oncology

## 2018-04-23 NOTE — Telephone Encounter (Signed)
LVM for TSH lab and PET scan scheduled for 07/16/2018. Awaiting a return call to confirm.

## 2018-04-23 NOTE — Telephone Encounter (Signed)
Oncology Nurse Navigator Documentation  LVMM for pt reminding him of 0800 arrival tomorrow morning for H[facility]N [facility], registration procedure.  [name], RN, BSN Head & Neck Oncology Nurse [place] at [place] [phone]

## 2018-04-23 NOTE — Progress Notes (Signed)
Oncology Nurse Navigator Documentation  Met with Travis Mcdonald prior to his follow/up with Dr. Isidore Moos.  He completed chemoRT 8/14 for tonsil cancer.  He was accompanied by his dtr. He reported notable reduction in throat pain, skin irritation; improving stamina, oral intake. I provided flyer for upcoming [facility], discussed series, encouraged attendance. He voiced understanding I can be contacted with needs/concerns.  [name], RN, BSN Head & Neck Oncology Nurse [place] at [place] [phone]

## 2018-04-24 ENCOUNTER — Ambulatory Visit
Admission: RE | Admit: 2018-04-24 | Discharge: 2018-04-24 | Disposition: A | Discharge status: Home / Self Care | Payer: PRIVATE HEALTH INSURANCE | Source: Ambulatory Visit | Attending: Radiation Oncology | Admitting: Radiation Oncology

## 2018-04-24 ENCOUNTER — Inpatient Hospital Stay: Payer: PRIVATE HEALTH INSURANCE | Attending: Hematology | Admitting: Nutrition

## 2018-04-24 ENCOUNTER — Ambulatory Visit (HOSPITAL_COMMUNITY): Payer: Medicaid - Dental | Admitting: Dentistry

## 2018-04-24 ENCOUNTER — Ambulatory Visit: Payer: PRIVATE HEALTH INSURANCE | Attending: Internal Medicine

## 2018-04-24 ENCOUNTER — Encounter: Payer: Self-pay | Admitting: *Deleted

## 2018-04-24 VITALS — BP 137/90 | HR 83 | Temp 98.0°F | Wt 241.0 lb

## 2018-04-24 VITALS — BP 136/84 | HR 99 | Temp 98.4°F | Resp 18 | Ht 74.0 in | Wt 241.2 lb

## 2018-04-24 DIAGNOSIS — C090 Malignant neoplasm of tonsillar fossa: Secondary | ICD-10-CM

## 2018-04-24 DIAGNOSIS — K1233 Oral mucositis (ulcerative) due to radiation: Secondary | ICD-10-CM

## 2018-04-24 DIAGNOSIS — R1310 Dysphagia, unspecified: Secondary | ICD-10-CM

## 2018-04-24 DIAGNOSIS — Z923 Personal history of irradiation: Secondary | ICD-10-CM | POA: Insufficient documentation

## 2018-04-24 DIAGNOSIS — R682 Dry mouth, unspecified: Secondary | ICD-10-CM

## 2018-04-24 DIAGNOSIS — Z931 Gastrostomy status: Secondary | ICD-10-CM | POA: Insufficient documentation

## 2018-04-24 DIAGNOSIS — R432 Parageusia: Secondary | ICD-10-CM

## 2018-04-24 DIAGNOSIS — K117 Disturbances of salivary secretion: Secondary | ICD-10-CM

## 2018-04-24 DIAGNOSIS — R252 Cramp and spasm: Secondary | ICD-10-CM

## 2018-04-24 DIAGNOSIS — Z9221 Personal history of antineoplastic chemotherapy: Secondary | ICD-10-CM

## 2018-04-24 DIAGNOSIS — K1230 Oral mucositis (ulcerative), unspecified: Secondary | ICD-10-CM

## 2018-04-24 DIAGNOSIS — C099 Malignant neoplasm of tonsil, unspecified: Secondary | ICD-10-CM | POA: Insufficient documentation

## 2018-04-24 NOTE — Patient Instructions (Addendum)
RECOMMENDATIONS: 1. Brush after meals and at bedtime.  Use fluoride at bedtime. 2. Use trismus exercises as directed. Suggest trismus exercises twice daily. 3. Use Biotene Rinse or salt water/baking soda rinses. 4. Multiple sips of water as needed. 5. Patient was instructed to follow-up with a dentist of his choice for periodontal therapy and other dental treatment as indicated. Patient is to schedule this appointment in approximately 2-3 months for early December of 2019.  Lenn Cal, DDS

## 2018-04-24 NOTE — Progress Notes (Signed)
04/24/2018  Patient Name:   Travis Mcdonald Date of Birth:   January 29, 1966 Medical Record Number: [id number]  BP 137/90 (BP Location: Left Arm)   Pulse 83   Temp 98 F (36.7 C)   Wt 241 lb (109.3 kg)   BMI 30.94 kg/m   [name] presents for oral examination after chemoradiation therapy. Patient has completed all radiation treatments 02/05/2018 through 03/28/2018. Patient completed 6 of 7 anticipated weekly chemotherapy treatments.  REVIEW OF CHIEF COMPLAINTS:  DRY MOUTH: yes HARD TO SWALLOW: yes  HURT TO SWALLOW: yes TASTE CHANGES: Taste is returning SORES IN MOUTH: yes TRISMUS: patient has decreased maximum interincisal opening measured at 40 mm. WEIGHT: 241 pounds down from initial 280 pounds.  HOME OH REGIMEN:  BRUSHING: once a day. The patient was encouraged to brush after meals and at bedtime. FLOSSING:  No. Patient encouraged to floss at bedtime. RINSING: Patient is using salt water and baking soda rinses. FLUORIDE: Patient uses fluoride""every now and then. Patient encouraged to use fluoride at bedtime in the trays as previously prescribed. TRISMUS EXERCISES:  Maximum interincisal opening: 40 down from initial 50 mm of maximum interincisal opening. Patient encouraged to use trismus exercises twice daily as prescribed.   DENTAL EXAM:  Oral Hygiene:(PLAQUE): poor oral hygiene LOCATION OF MUCOSITIS: back of throat DESCRIPTION OF SALIVA: decreased saliva. ANY EXPOSED BONE: none noted OTHER WATCHED AREAS: previous extraction site of tooth numbers 1, 16, 17, and 32. DX: Xerostomia, Dysgeusia, Dysphagia, Odynophagia, Accretions, Trimus and Mucositis  RECOMMENDATIONS: 1. Brush after meals and at bedtime.  Use fluoride at bedtime. 2. Use trismus exercises as directed. Suggest trismus exercises twice daily. 3. Use Biotene Rinse or salt water/baking soda rinses. 4. Multiple sips of water as needed. 5. Patient was instructed to follow-up with a dentist of his choice  for periodontal therapy and other dental treatment as indicated. Patient is to schedule this appointment in approximately 2-3 months for early December of 2019.  Lenn Cal, DDS

## 2018-04-24 NOTE — Therapy (Signed)
[facility] [address] [place], [place], [address] Phone: [phone]   Fax:  [phone]  Speech Language Pathology Treatment  Patient Details  Name: Travis Mcdonald MRN: [id number] Date of Birth: May 26, 1966 Referring Provider: Eppie Gibson, MD   Encounter Date: 04/24/2018  End of Session - 04/24/18 1002    Visit Number  2    Number of Visits  7    Date for SLP Re-Evaluation  08/03/18    SLP Start Time  0840    SLP Stop Time   0930    SLP Time Calculation (min)  50 min    Activity Tolerance  Patient tolerated treatment well       Past Medical History:  Diagnosis Date  . Hypertension     Past Surgical History:  Procedure Laterality Date  . [place]   ruptured lower back disc surgery. L4 & L5 Lumber disc  . IR GASTROSTOMY TUBE MOD SED  02/02/2018  . IR IMAGING GUIDED PORT INSERTION  02/02/2018    There were no vitals filed for this visit.  Subjective Assessment - 04/24/18 0854    Subjective  Pt has one Ensure/Boost shake daily, and 5-6 cans tube feeding.    Patient is accompained by:  Family member   wife   Currently in Pain?  Yes            ADULT SLP TREATMENT - 04/24/18 0856      General Information   Behavior/Cognition  Alert;Cooperative;Pleasant mood      Treatment Provided   Treatment provided  Dysphagia      Dysphagia Treatment   Temperature Spikes Noted  No    Respiratory Status  Room air    Treatment Methods  Skilled observation;Therapeutic exercise;Patient/caregiver education    Patient observed directly with PO's  Yes    Type of PO's observed  Thin liquids;Ice chips    Liquids provided via  Cup    Oral Phase Signs & Symptoms  --   none noted today with one sip   Pharyngeal Phase Signs & Symptoms  --   none noted today with one sip   Other treatment/comments  Pt told SLP he has only done [place] intermittently since ST eval. Pt told SLP why he was completing  exercises independently. Pt hindered by pain with intake of POs currently. SLP suggested pt take ice cihps throughout the day to stave off muscle disuse atrophy, and treat pain with OTC pain meds PRN. Pt stated with larger sips he would cough.       Pain Assessment   Pain Assessment  0-10    Pain Score  6     Pain Location  ear, radiates down mandible    Pain Descriptors / Indicators  Constant;Radiating;Sharp    Pain Intervention(s)  Monitored during session;Limited activity within patient's tolerance      Assessment / Recommendations / Plan   Plan  Continue with current plan of care      Dysphagia Recommendations   Diet recommendations  Thin liquid   and ice chips   Liquids provided via  Cup    Medication Administration  Via alternative means   or as tolerated     Progression Toward Goals   Progression toward goals  Not progressing toward goals (comment)   noncompliance with HEP      SLP Education - 04/24/18 1002    Education Details  HEP procedure, late effects head/neck radiation on  swallowing ability, use of OTC pain meds and POs/HEP completion    Person(s) Educated  Patient;Spouse    Methods  Explanation;Demonstration;Verbal cues;Handout    Comprehension  Verbalized understanding;Returned demonstration;Verbal cues required;Need further instruction       SLP Short Term Goals - 04/24/18 1005      SLP SHORT TERM GOAL #1   Title  pt will complete HEP with rare min A over two sessions    Time  2    Period  --   ST sessions   Status  On-going      SLP SHORT TERM GOAL #2   Title  pt will tell SLP why he is completing HEP over two sessions    Baseline  04-24-18    Time  2    Period  --   ST sessions   Status  On-going      SLP SHORT TERM GOAL #3   Title  pt will tell SLP how a food journal can assist with return to pt's safest diet    Time  2    Period  --   ST visits   Status  On-going       SLP Long Term Goals - 04/24/18 1006      SLP LONG TERM GOAL #1    Title  pt will complete HEP with modified independence over three sessions    Time  6    Period  --   ST sessions   Status  On-going      SLP LONG TERM GOAL #2   Title  pt will tell SLP 3 overt s/s of aspiration PNA with modified independence    Time  6    Period  --   ST visits   Status  On-going      SLP LONG TERM GOAL #3   Title  pt will tell SLP when HEP frequency can be reduced to x2-3/week    Time  4    Period  --   ST visits   Status  On-going       Plan - 04/24/18 1003    Clinical Impression Statement  Pt with oropharyngeal swallowing without overt s/s aspiration with one small sip of H2O, however the probability of swallowing difficulty increases dramatically as pt has not complied with HEP frequency, and the completion of chemo and radiation therapy. Pt will need to cont to be followed by SLP for regular assessment of accurate HEP completion as well as for safety with POs both during and following treatment/s.    Speech Therapy Frequency  --   approx once every four weeks   Duration  --   7 total visits   Treatment/Interventions  Aspiration precaution training;Diet toleration management by SLP;Pharyngeal strengthening exercises;Group dysphagia treatment;Trials of upgraded texture/liquids;Internal/external aids;Patient/family education;Compensatory strategies;SLP instruction and feedback    Potential to Achieve Goals  Good    SLP Home Exercise Plan  provided today    Consulted and Agree with Plan of Care  Patient       Patient will benefit from skilled therapeutic intervention in order to improve the following deficits and impairments:   Dysphagia, unspecified type    Problem List Patient Active Problem List   Diagnosis Date Noted  . Port-A-Cath in place 02/26/2018  . Counseling regarding advance care planning and goals of care 02/18/2018  . Metastatic squamous cell carcinoma ([place]) 01/29/2018  . Carcinoma of tonsillar fossa ([place]) 01/15/2018  . Retrocalcaneal  bursitis (back of heel)  09/30/2015    [place] ,[place], [place]  04/24/2018, 10:07 AM  [facility] [address] [place], [place], [address] Phone: [phone]   Fax:  [phone]   Name: Travis Mcdonald MRN: [id number] Date of Birth: 16-Nov-1965

## 2018-04-24 NOTE — Patient Instructions (Signed)
Do non-swallowing exercises twice a day with recommended reps, complete exercises where you have to swallow three times a day with limited reps if necessary  - increase number of reps as pain subsides.   SLP gave pt another copy of HEP today as he had lost his original copy.

## 2018-04-24 NOTE — Progress Notes (Signed)
Patient's nutrition follow-up completed and had neck clinic.  Patient completed radiation therapy for tonsil cancer on Tuesday, August 13. Current weight documented is 241.4 pounds decreased from 247.2 pounds. Patient reports he is tolerating 1 Ensure Plus by mouth daily. He has increased Osmolite 1.5 via PEG to 5 bottles daily. Patient denies nausea and vomiting. He has not needed to take much medication.  Estimated nutrition needs: 2400-2600 cal, 135-150 g protein, 2.7 L fluid.  Nutrition diagnosis: Inadequate oral intake continues.  Intervention: Educated patient to increase Ensure Plus 3 times daily by mouth. Patient to continue Osmolite 1.5 via PEG, 5 bottles daily. Recommended patient continue to flush his feeding tube with water before and after bolus feeding. Encourage water by mouth as tolerated. Reviewed strategies for increasing oral intake. Questions were answered and teach back method was used.  Monitoring, evaluation, goals: Patient will tolerate adequate calories and protein to minimize further weight loss and promote healing.  Next visit: Monday, October 7.  **Disclaimer: This note was dictated with voice recognition software. Similar sounding words can inadvertently be transcribed and this note may contain transcription errors which may not have been corrected upon publication of note.**

## 2018-04-25 NOTE — Progress Notes (Signed)
Oncology Nurse Navigator Documentation  Met with Travis Mcdonald upon his arrival for H&N [place].  He was accompanied by his wife.  Provided verbal and written overview of [place], the clinicians who will be seeing him, encouraged him to ask questions during his time with them.  He was seen by Nutrition and SLP.  Spoke with him at end of [facility], addressed questions.  He proceeded to [place] s/p clinic. I encouraged him to call me with needs/concerns.  [name], RN, BSN Head & Neck Oncology [place] at [place] [phone]

## 2018-04-26 ENCOUNTER — Inpatient Hospital Stay (HOSPITAL_BASED_OUTPATIENT_CLINIC_OR_DEPARTMENT_OTHER): Payer: PRIVATE HEALTH INSURANCE | Admitting: Hematology

## 2018-04-26 ENCOUNTER — Inpatient Hospital Stay: Payer: PRIVATE HEALTH INSURANCE

## 2018-04-26 ENCOUNTER — Telehealth: Payer: Self-pay

## 2018-04-26 ENCOUNTER — Encounter: Payer: Self-pay | Admitting: Hematology

## 2018-04-26 VITALS — BP 117/79 | HR 76 | Temp 97.8°F | Resp 18 | Ht 74.0 in | Wt 242.6 lb

## 2018-04-26 DIAGNOSIS — C099 Malignant neoplasm of tonsil, unspecified: Secondary | ICD-10-CM | POA: Diagnosis present

## 2018-04-26 DIAGNOSIS — C090 Malignant neoplasm of tonsillar fossa: Secondary | ICD-10-CM | POA: Diagnosis not present

## 2018-04-26 DIAGNOSIS — Z95828 Presence of other vascular implants and grafts: Secondary | ICD-10-CM

## 2018-04-26 DIAGNOSIS — Z923 Personal history of irradiation: Secondary | ICD-10-CM | POA: Diagnosis not present

## 2018-04-26 DIAGNOSIS — E860 Dehydration: Secondary | ICD-10-CM

## 2018-04-26 DIAGNOSIS — K1233 Oral mucositis (ulcerative) due to radiation: Secondary | ICD-10-CM

## 2018-04-26 DIAGNOSIS — Z931 Gastrostomy status: Secondary | ICD-10-CM | POA: Diagnosis not present

## 2018-04-26 DIAGNOSIS — R634 Abnormal weight loss: Secondary | ICD-10-CM

## 2018-04-26 DIAGNOSIS — E876 Hypokalemia: Secondary | ICD-10-CM

## 2018-04-26 LAB — CMP (CANCER CENTER ONLY)
ALBUMIN: 3.2 g/dL — AB (ref 3.5–5.0)
ALT: 15 U/L (ref 0–44)
ANION GAP: 8 (ref 5–15)
AST: 13 U/L — ABNORMAL LOW (ref 15–41)
Alkaline Phosphatase: 68 U/L (ref 38–126)
BILIRUBIN TOTAL: 0.4 mg/dL (ref 0.3–1.2)
BUN: 13 mg/dL (ref 6–20)
CO2: 30 mmol/L (ref 22–32)
Calcium: 9.5 mg/dL (ref 8.9–10.3)
Chloride: 102 mmol/L (ref 98–111)
Creatinine: 0.83 mg/dL (ref 0.61–1.24)
GFR, Est AFR Am: >60 mL/min (ref >60)
Glucose, Bld: 128 mg/dL — ABNORMAL HIGH (ref 70–99)
POTASSIUM: 3.6 mmol/L (ref 3.5–5.1)
Sodium: 140 mmol/L (ref 135–145)
TOTAL PROTEIN: 6.1 g/dL — AB (ref 6.5–8.1)

## 2018-04-26 LAB — CBC WITH DIFFERENTIAL/PLATELET
BASOS PCT: 1 %
Basophils Absolute: 0 10*3/uL (ref 0.0–0.1)
Eosinophils Absolute: 0.2 10*3/uL (ref 0.0–0.5)
Eosinophils Relative: 5 %
HEMATOCRIT: 32.6 % — AB (ref 38.4–49.9)
Hemoglobin: 10.9 g/dL — ABNORMAL LOW (ref 13.0–17.1)
Lymphocytes Relative: 28 %
Lymphs Abs: 1.1 10*3/uL (ref 0.9–3.3)
MCH: 30.9 pg (ref 27.2–33.4)
MCHC: 33.4 g/dL (ref 32.0–36.0)
MCV: 92.4 fL (ref 79.3–98.0)
MONO ABS: 0.4 10*3/uL (ref 0.1–0.9)
MONOS PCT: 9 %
NEUTROS ABS: 2.2 10*3/uL (ref 1.5–6.5)
Neutrophils Relative %: 57 %
Platelets: 171 10*3/uL (ref 140–400)
RBC: 3.53 MIL/uL — ABNORMAL LOW (ref 4.20–5.82)
RDW: 17.4 % — AB (ref 11.0–14.6)
WBC: 3.9 10*3/uL — ABNORMAL LOW (ref 4.0–10.3)

## 2018-04-26 LAB — MAGNESIUM: MAGNESIUM: 1.8 mg/dL (ref 1.7–2.4)

## 2018-04-26 LAB — PHOSPHORUS: PHOSPHORUS: 3.4 mg/dL (ref 2.5–4.6)

## 2018-04-26 LAB — TSH: TSH: 1.872 u[IU]/mL (ref 0.320–4.118)

## 2018-04-26 MED ORDER — SODIUM CHLORIDE 0.9% FLUSH
10.0000 mL | INTRAVENOUS | Status: DC | PRN
  Administered 2018-04-26: 10 mL
  Filled 2018-04-26: qty 10

## 2018-04-26 MED ORDER — HEPARIN SOD (PORK) LOCK FLUSH 100 UNIT/ML IV SOLN
500.0000 [IU] | Freq: Once | INTRAVENOUS | Status: AC | PRN
  Administered 2018-04-26: 500 [IU]
  Filled 2018-04-26: qty 5

## 2018-04-26 NOTE — Progress Notes (Signed)
HEMATOLOGY/ONCOLOGY CLINIC NOTE  Date of Service: 04/26/2018  Patient Care Team: Celene Squibb, MD as PCP - General (Internal Medicine) Rozetta Nunnery, MD as Consulting Physician (Otolaryngology) Eppie Gibson, MD as Attending Physician (Radiation Oncology) Brunetta Genera, MD as Consulting Physician (Hematology) Leota Sauers, RN as Oncology Nurse Navigator Karie Mainland, RD as Dietitian (Nutrition) Valentino Saxon [place], CCC-SLP as Speech Language Pathologist (Speech Pathology) Wynelle Beckmann, Melodie Bouillon, PT as Physical Therapist (Physical Therapy) Kennith Center, LCSW as Social Worker  CHIEF COMPLAINTS/PURPOSE OF CONSULTATION:  F/u for Tonsillar carcinoma   HISTORY OF PRESENTING ILLNESS:   Travis Mcdonald is a wonderful 52 y.o. 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 He is accompanied today by his wife. The pt reports that he is doing well overall.   The pt reports first noticing an enlargement of his left neck around January 2019. He believed these to be sinus related since he took sinus medications which decreased the swelling. He notes aggravating pain but denies significant pain or problems swallowing. He reports left ear and jaw pain as well.   Four teeth extraction on 01/19/18 with radiation planning this afternoon and tentative radiation beginning 02/05/18.   He denies any ear infection concerns after his audiogram. He denies recurrent ear infections but notes that he had two ear infections last year.    The pt notes that he will try to continue work each day.  He adds that he has a skin rash on his right upper leg which is recurrent in the summers. He denies seeing a dermatologist in the past and treats the associated itching with cortisone and benadryl.   He notes that he does snore at night and his wife notes that he does not snore when he sleeps on his side.   Of note prior to the patient's  visit today, pt has had PET/CT completed on 01/04/18 with results revealing Extensive hypermetabolic activity in the left neck, index lesion maximum SUV 20.3. Looking for a primary lesion in the neck, there is some very faint asymmetry of the left palatine tonsillar pillar which could conceivably represent a small lesion. 2. No significant abnormal activity in the chest, abdomen/pelvis, or skeleton. 3. Other imaging findings of potential clinical significance: Aortic Atherosclerosis (ICD10-I70.0). Coronary atherosclerosis. Small mucous retention cyst in left maxillary sinus. Diffuse hepatic steatosis. Nonspecific but probably benign hypodense lesions in the lateral segment left hepatic lobe.   Most recent lab results (01/24/18) of CBC w/diff, and CMP  is as follows: all values are WNL except glucose at 162. Magnesium 01/24/18 is WNL at 1.8 Phosphorus 01/24/18 is WNL at 2.7  On review of systems, pt reports left ear pain, left neck discomfort, left jaw pain, upper right leg rash, and denies abdominal pains, other skin rashes, and any other symptoms.   On PMHx the pt reports 1998 and 1999 back surgeries for ruptured disks with microdiscectomy, high blood pressure, denies diabetes, heart, lung or kidney problems. 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 Denies ever smoking cigarettes. Denies ETOH consumption in 30 years. Reports infrequent marijuana smoking 30 years ago.  On Family Hx the pt reports maternal cousin with brain cancer, maternal cousin with pancreatic cancer, maternal uncle with cancer. Denies much paternal knowledge.   Interval History:   Travis Mcdonald returns today regarding his tonsillar carcinoma. The patient's last visit with [place] was  on 04/06/18. He is accompanied today by his daughter. The pt reports that he is doing well overall.   The pt reports that he is at goal for tube feeding and is now orally consuming one Ensure each day. He notes that  his voice is much stronger, which is quite evident as compared to his last voice. The pt notes that he continues to have some pain, though reduced, when swallowing. The pt adds that his left neck swelling has continued to resolved and is almost unnoticeable at this time. He adds that he has successfully increased his hydration status in the interim. He has gained four pounds in the last three weeks. He endorses stable hearing and his previous hearing has decreased in frequency.   Lab results today (04/26/18) of CBC w/diff is as follows: all values are WNL except for WBC at 3.9k, RBC at 3.53, HGB at 10.9, HCT at 32.6, RDW at 17.4.  On review of systems, pt reports improving energy levels, stronger voice, resoled left neck swelling, staying hydrated, eating well, stable hearing, less discomfort when swallowing, occasional cough, and denies fevers, chills, night sweats, leg swelling, abdominal pains, discomfort at feeding tube site, skin changes, testicular pain or swelling, and any other symptoms.   MEDICAL HISTORY:  Past Medical History:  Diagnosis Date  . Hypertension     SURGICAL HISTORY: Past Surgical History:  Procedure Laterality Date  . [place]   ruptured lower back disc surgery. L4 & L5 Lumber disc  . IR GASTROSTOMY TUBE MOD SED  02/02/2018  . IR IMAGING GUIDED PORT INSERTION  02/02/2018    SOCIAL HISTORY: Social History   Socioeconomic History  . Marital status: Married    Spouse name: Not on file  . Number of children: Not on file  . Years of education: Not on file  . Highest education level: Not on file  Occupational History  . Not on file  Social Needs  . Financial resource strain: Not on file  . Food insecurity:    Worry: Not on file    Inability: Not on file  . Transportation needs:    Medical: Not on file    Non-medical: Not on file  Tobacco Use  . Smoking status: Never Smoker  . Smokeless tobacco: Never Used  Substance and Sexual Activity  .  Alcohol use: No    Comment: no alcohol for 30 years.   . Drug use: No  . Sexual activity: Not on file  Lifestyle  . Physical activity:    Days per week: Not on file    Minutes per session: Not on file  . Stress: Not on file  Relationships  . Social connections:    Talks on phone: Not on file    Gets together: Not on file    Attends religious service: Not on file    Active member of club or organization: Not on file    Attends meetings of clubs or organizations: Not on file    Relationship status: Not on file  . Intimate partner violence:    Fear of current or ex partner: No    Emotionally abused: No    Physically abused: No    Forced sexual activity: No  Other Topics Concern  . Not on file  Social History Narrative  . Not on file    FAMILY HISTORY: History reviewed. No pertinent family history.  ALLERGIES:  has No Known Allergies.  MEDICATIONS:  Current Outpatient Medications  Medication  Sig Dispense Refill  . senna-docusate (SENNA S) 8.6-50 MG tablet Take 2 tablets by mouth at bedtime as needed for mild constipation or moderate constipation. 60 tablet 1  . benzonatate (TESSALON) 100 MG capsule Take 1 capsule (100 mg total) by mouth 3 (three) times daily as needed for cough. (Patient not taking: Reported on 04/18/2018) 20 capsule 0  . clotrimazole (LOTRIMIN AF) 1 % cream Apply 1 application topically 2 (two) times daily. To rash on rt leg 60 g 0  . dexamethasone (DECADRON) 4 MG tablet Take 2 tablets by mouth once a day on the day after chemotherapy and then take 2 tablets two times a day for 2 days. Take with food. (Patient not taking: Reported on 04/18/2018) 30 tablet 1  . enalapril-hydrochlorothiazide (VASERETIC) 10-25 MG tablet Take 1 tablet by mouth daily.     Marland Kitchen lidocaine (XYLOCAINE) 2 % solution Patient: Mix 1part 2% viscous lidocaine, 1part H20. Swish & swallow 42mL of diluted mixture, 108min before meals and at bedtime, up to QID (Patient not taking: Reported on 04/18/2018)  100 mL 5  . lidocaine-prilocaine (EMLA) cream Apply to affected area once 30 g 3  . LORazepam (LORAZEPAM INTENSOL) 2 MG/ML concentrated solution Take 0.5 mLs (1 mg total) by mouth as directed. 30 mins prior to radiation (Patient not taking: Reported on 04/18/2018) 15 mL 0  . metoCLOPramide (REGLAN) 5 MG/5ML solution Place 5 mLs (5 mg total) into feeding tube 3 (three) times daily with meals as needed for nausea or vomiting. Instead of Compazine. (Patient not taking: Reported on 04/18/2018) 120 mL 0  . Nutritional Supplements (FEEDING SUPPLEMENT, OSMOLITE 1.5 CAL,) LIQD Begin 1 carton Osmolite 1.5 via PEG QID with 120 mL free water before and after. Increase to 1 1/2 cartons on Thursday as tolerated. Increase to 2 cartons bid and 1 1/2 cartons bid on Saturday. Flush tube with additional 240 mL free water TID between feedings. 7 Bottle 0  . ondansetron (ZOFRAN) 8 MG tablet Take 1 tablet (8 mg total) by mouth 2 (two) times daily as needed. Start on the third day after chemotherapy. (Patient not taking: Reported on 04/18/2018) 30 tablet 1  . oxyCODONE (ROXICODONE INTENSOL) 20 MG/ML concentrated solution Take 0.5 mLs (10 mg total) by mouth every 4 (four) hours as needed for severe pain or breakthrough pain. (Patient not taking: Reported on 04/18/2018) 30 mL 0  . potassium chloride (KLOR-CON) 20 MEQ packet Place 20 mEq into feeding tube daily. Mixed in 172ml of water (Patient not taking: Reported on 04/18/2018) 10 packet 0  . sodium fluoride (FLUORISHIELD) 1.1 % GEL dental gel Instill one drop of gel per tooth space of fluoride tray. Place over teeth for 5 minutes. Remove. Spit out excess. Repeat nightly. 120 mL prn   No current facility-administered medications for this visit.     REVIEW OF SYSTEMS:    A 10+ POINT REVIEW OF SYSTEMS WAS OBTAINED including neurology, dermatology, psychiatry, cardiac, respiratory, lymph, extremities, GI, GU, Musculoskeletal, constitutional, breasts, reproductive, HEENT.  All pertinent  positives are noted in the HPI.  All others are negative.   PHYSICAL EXAMINATION: ECOG PERFORMANCE STATUS: 1 - Symptomatic but completely ambulatory  VS reviewed GENERAL:alert, in no acute distress and comfortable SKIN: no acute rashes, no significant lesions EYES: conjunctiva are pink and non-injected, sclera anicteric OROPHARYNX: MMM, no exudates, no oropharyngeal erythema or ulceration NECK: supple, no JVD LYMPH:  no palpable lymphadenopathy in the cervical, axillary or inguinal regions, port a cath in situ LUNGS: clear  to auscultation b/l with normal respiratory effort HEART: regular rate & rhythm ABDOMEN:  normoactive bowel sounds , non tender, not distended. No palpable hepatosplenomegaly. Feeding tube in situ Extremity: no pedal edema PSYCH: alert & oriented x 3 with fluent speech NEURO: no focal motor/sensory deficits   LABORATORY DATA:  I have reviewed the data as listed  . CBC Latest Ref Rng & Units 04/26/2018 04/06/2018 03/22/2018  WBC 4.0 - 10.3 K/uL 3.9(L) 6.5 2.7(L)  Hemoglobin 13.0 - 17.1 g/dL 10.9(L) 11.5(L) 12.1(L)  Hematocrit 38.4 - 49.9 % 32.6(L) 34.1(L) 34.2(L)  Platelets 140 - 400 K/uL 171 345 139(L)   . CBC    Component Value Date/Time   WBC 3.9 (L) 04/26/2018 0814   RBC 3.53 (L) 04/26/2018 0814   HGB 10.9 (L) 04/26/2018 0814   HGB 16.6 02/12/2018 1341   HCT 32.6 (L) 04/26/2018 0814   PLT 171 04/26/2018 0814   PLT 250 02/12/2018 1341   MCV 92.4 04/26/2018 0814   MCH 30.9 04/26/2018 0814   MCHC 33.4 04/26/2018 0814   RDW 17.4 (H) 04/26/2018 0814   LYMPHSABS 1.1 04/26/2018 0814   MONOABS 0.4 04/26/2018 0814   EOSABS 0.2 04/26/2018 0814   BASOSABS 0.0 04/26/2018 0814    . CMP Latest Ref Rng & Units 04/26/2018 04/06/2018 03/22/2018  Glucose 70 - 99 mg/dL 128(H) 92 127(H)  BUN 6 - 20 mg/dL 13 23(H) 16  Creatinine 0.61 - 1.24 mg/dL 0.83 1.16 0.98  Sodium 135 - 145 mmol/L 140 139 135  Potassium 3.5 - 5.1 mmol/L 3.6 3.2(L) 3.8  Chloride 98 - 111 mmol/L  102 94(L) 93(L)  CO2 22 - 32 mmol/L 30 35(H) 30  Calcium 8.9 - 10.3 mg/dL 9.5 9.6 9.4  Total Protein 6.5 - 8.1 g/dL 6.1(L) 7.0 6.8  Total Bilirubin 0.3 - 1.2 mg/dL 0.4 0.6 0.7  Alkaline Phos 38 - 126 U/L 68 86 84  AST 15 - 41 U/L 13(L) 28 13(L)  ALT 0 - 44 U/L 15 201(H) 28   Component     Latest Ref Rng & Units 04/26/2018  Magnesium     1.7 - 2.4 mg/dL 1.8  Phosphorus     2.5 - 4.6 mg/dL 3.4  TSH     0.320 - 4.118 uIU/mL 1.872     11/10/17 Comprehensive Hearing Test:    12/27/17 Surgical Pathology:    RADIOGRAPHIC STUDIES: I have personally reviewed the radiological images as listed and agreed with the findings in the report. No results found.  ASSESSMENT & PLAN:   52 y.o. 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 Constipation  3. Radiation Mucositis - grade 2 - I much improved  PLAN:   -Discussed pt labwork today, 04/26/18; HGB at 10.9 -Continue with PET/CT as ordered by Dr. Isidore Moos on 07/16/18 -Recommended steam inhalation -No indication for barryium swallow study -Recommended that the pt continue to feed well, consume at least 48-64 oz of water each day, and walk 20-30 minutes each day.   -Recommended that the pt continue to slowly increased PO intake -Will see the pt back in one month  -Continue Salt/baking soda mouthwash 4-5 times a day   4. Eczematoid skin rash on his lower extremities RT>left. Resolved  with anti fungal - fluconazole. Plan -completed po fluconazole with near resolution of rash -conitnue topical anti-fungals . -Continue moisturizing previously fungal-affected skin areas on bilateral LE, use colloidal silver soap for anti-fungal prevention      RTC with Dr Irene Limbo in 4 weeks with labs and port flush appointment    All of the patients questions were answered with apparent satisfaction. The patient knows to call the clinic with any problems, questions or concerns.  The total time spent in the appt was 20 minutes and more than 50% was on counseling and direct patient cares.     Sullivan Lone MD MS AAHIVMS [facility] Hematology/Oncology Physician [facility]  (Office):       [phone] (Work cell):  [phone] (Fax):           [phone]  04/26/2018 9:49 AM  I, Baldwin Jamaica, am acting as a scribe for Dr. Irene Limbo  .I have reviewed the above documentation for accuracy and completeness, and I agree with the above. Brunetta Genera MD

## 2018-04-26 NOTE — Patient Instructions (Signed)
Implanted Port Home Guide An implanted port is a type of central line that is placed under the skin. Central lines are used to provide IV access when treatment or nutrition needs to be given through a person's veins. Implanted ports are used for long-term IV access. An implanted port may be placed because:  You need IV medicine that would be irritating to the small veins in your hands or arms.  You need long-term IV medicines, such as antibiotics.  You need IV nutrition for a long period.  You need frequent blood draws for lab tests.  You need dialysis.  Implanted ports are usually placed in the chest area, but they can also be placed in the upper arm, the abdomen, or the leg. An implanted port has two main parts:  Reservoir. The reservoir is round and will appear as a small, raised area under your skin. The reservoir is the part where a needle is inserted to give medicines or draw blood.  Catheter. The catheter is a thin, flexible tube that extends from the reservoir. The catheter is placed into a large vein. Medicine that is inserted into the reservoir goes into the catheter and then into the vein.  How will I care for my incision site? Do not get the incision site wet. Bathe or shower as directed by your health care provider. How is my port accessed? Special steps must be taken to access the port:  Before the port is accessed, a numbing cream can be placed on the skin. This helps numb the skin over the port site.  Your health care provider uses a sterile technique to access the port. ? Your health care provider must put on a mask and sterile gloves. ? The skin over your port is cleaned carefully with an antiseptic and allowed to dry. ? The port is gently pinched between sterile gloves, and a needle is inserted into the port.  Only "non-coring" port needles should be used to access the port. Once the port is accessed, a blood return should be checked. This helps ensure that the port  is in the vein and is not clogged.  If your port needs to remain accessed for a constant infusion, a clear (transparent) bandage will be placed over the needle site. The bandage and needle will need to be changed every week, or as directed by your health care provider.  Keep the bandage covering the needle clean and dry. Do not get it wet. Follow your health care provider's instructions on how to take a shower or bath while the port is accessed.  If your port does not need to stay accessed, no bandage is needed over the port.  What is flushing? Flushing helps keep the port from getting clogged. Follow your health care provider's instructions on how and when to flush the port. Ports are usually flushed with saline solution or a medicine called heparin. The need for flushing will depend on how the port is used.  If the port is used for intermittent medicines or blood draws, the port will need to be flushed: ? After medicines have been given. ? After blood has been drawn. ? As part of routine maintenance.  If a constant infusion is running, the port may not need to be flushed.  How long will my port stay implanted? The port can stay in for as long as your health care provider thinks it is needed. When it is time for the port to come out, surgery will be   done to remove it. The procedure is similar to the one performed when the port was put in. When should I seek immediate medical care? When you have an implanted port, you should seek immediate medical care if:  You notice a bad smell coming from the incision site.  You have swelling, redness, or drainage at the incision site.  You have more swelling or pain at the port site or the surrounding area.  You have a fever that is not controlled with medicine.  This information is not intended to replace advice given to you by your health care provider. Make sure you discuss any questions you have with your health care provider. Document  Released: 08/01/2005 Document Revised: 01/07/2016 Document Reviewed: 04/08/2013 Elsevier Interactive Patient Education  2017 Elsevier Inc.  

## 2018-04-26 NOTE — Telephone Encounter (Signed)
Was unable to leave a voice message due to mail box was full. Mailed a letter with a calender enclosed. Per 9/12 los

## 2018-04-27 ENCOUNTER — Ambulatory Visit: Payer: Self-pay | Admitting: Radiation Oncology

## 2018-05-07 ENCOUNTER — Telehealth: Payer: Self-pay | Admitting: Nutrition

## 2018-05-07 NOTE — Telephone Encounter (Signed)
Provided [facility] contact information for ordering TF.

## 2018-05-10 ENCOUNTER — Telehealth: Payer: Self-pay | Admitting: *Deleted

## 2018-05-10 NOTE — Telephone Encounter (Signed)
On 05-10-18 fax medical records to [employer], it was end of tx note.

## 2018-05-21 ENCOUNTER — Encounter: Payer: Self-pay | Admitting: Hematology

## 2018-05-21 ENCOUNTER — Inpatient Hospital Stay: Payer: PRIVATE HEALTH INSURANCE | Attending: Hematology | Admitting: Nutrition

## 2018-05-21 DIAGNOSIS — K5900 Constipation, unspecified: Secondary | ICD-10-CM | POA: Insufficient documentation

## 2018-05-21 DIAGNOSIS — C099 Malignant neoplasm of tonsil, unspecified: Secondary | ICD-10-CM | POA: Insufficient documentation

## 2018-05-21 DIAGNOSIS — I10 Essential (primary) hypertension: Secondary | ICD-10-CM | POA: Insufficient documentation

## 2018-05-21 DIAGNOSIS — R6884 Jaw pain: Secondary | ICD-10-CM | POA: Insufficient documentation

## 2018-05-21 DIAGNOSIS — D649 Anemia, unspecified: Secondary | ICD-10-CM | POA: Insufficient documentation

## 2018-05-21 DIAGNOSIS — Z79899 Other long term (current) drug therapy: Secondary | ICD-10-CM | POA: Insufficient documentation

## 2018-05-21 NOTE — Progress Notes (Signed)
Patient came in to discuss payment arrangements for his bills. Advised patient to contact the number on the bill to make arrangements with them directly.  Reviewed patient's diagnosis and there is no copay assistance available for his diagnosis.  Asked patient if he lived in [place], he states he does. Offered him the [employer] application and he accepted. Advised him he may bring application and supporting documents for me to email to them or he may mail or take to them directly. Advised him they will contact him directly once application has been processed. He verbalized understanding.  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 Based on income requirements for his household, he states they are over-income quoted based on federal poverty guidelines. Gave him my card for any additional financial questions or concerns.

## 2018-05-21 NOTE — Progress Notes (Signed)
Nutrition follow-up completed.  Patient has completed treatment for head and neck cancer.  Radiation therapy completed on Tuesday, August 13. Weight decreased and documented as 239.6 pounds October 7 down from 241.4 pounds September 10. Patient reports he is consuming between 2 and 3 Ensure Enlive by mouth daily. He is using 4-5 bottles Osmolite 1.5 via PEG daily Patient reports he tolerates very smooth foods and liquids but has not tolerated anything with texture. He continues to try new foods on a regular basis. Reports he is walking on a regular basis to rebuild his energy. He denies other nutrition impact symptoms.  Nutrition diagnosis: Inadequate oral intake continues.  Intervention: Educated patient to continue [employer] and Osmolite 1.5. Reviewed strategies for increasing textures and varieties of foods. Questions were answered.  Teach back method used.  Monitoring, evaluation, goals: Patient will tolerate adequate calories and protein to promote healing and minimize weight loss.  Next visit: To be scheduled as needed.  **Disclaimer: This note was dictated with voice recognition software. Similar sounding words can inadvertently be transcribed and this note may contain transcription errors which may not have been corrected upon publication of note.**

## 2018-05-24 ENCOUNTER — Inpatient Hospital Stay: Payer: PRIVATE HEALTH INSURANCE

## 2018-05-24 ENCOUNTER — Telehealth: Payer: Self-pay

## 2018-05-24 ENCOUNTER — Encounter: Payer: Self-pay | Admitting: Hematology

## 2018-05-24 ENCOUNTER — Inpatient Hospital Stay (HOSPITAL_BASED_OUTPATIENT_CLINIC_OR_DEPARTMENT_OTHER): Payer: PRIVATE HEALTH INSURANCE | Admitting: Hematology

## 2018-05-24 VITALS — BP 118/86 | HR 73 | Temp 97.7°F | Resp 16 | Ht 74.0 in | Wt 239.6 lb

## 2018-05-24 DIAGNOSIS — K5900 Constipation, unspecified: Secondary | ICD-10-CM

## 2018-05-24 DIAGNOSIS — Z95828 Presence of other vascular implants and grafts: Secondary | ICD-10-CM

## 2018-05-24 DIAGNOSIS — C099 Malignant neoplasm of tonsil, unspecified: Secondary | ICD-10-CM

## 2018-05-24 DIAGNOSIS — Z79899 Other long term (current) drug therapy: Secondary | ICD-10-CM | POA: Diagnosis not present

## 2018-05-24 DIAGNOSIS — R6884 Jaw pain: Secondary | ICD-10-CM | POA: Diagnosis not present

## 2018-05-24 DIAGNOSIS — D649 Anemia, unspecified: Secondary | ICD-10-CM

## 2018-05-24 DIAGNOSIS — C090 Malignant neoplasm of tonsillar fossa: Secondary | ICD-10-CM

## 2018-05-24 DIAGNOSIS — I10 Essential (primary) hypertension: Secondary | ICD-10-CM | POA: Diagnosis not present

## 2018-05-24 DIAGNOSIS — K1233 Oral mucositis (ulcerative) due to radiation: Secondary | ICD-10-CM

## 2018-05-24 LAB — CMP (CANCER CENTER ONLY)
ALT: 15 U/L (ref 0–44)
AST: 13 U/L — AB (ref 15–41)
Albumin: 3.7 g/dL (ref 3.5–5.0)
Alkaline Phosphatase: 77 U/L (ref 38–126)
Anion gap: 10 (ref 5–15)
BILIRUBIN TOTAL: 0.4 mg/dL (ref 0.3–1.2)
BUN: 11 mg/dL (ref 6–20)
CO2: 28 mmol/L (ref 22–32)
CREATININE: 0.79 mg/dL (ref 0.61–1.24)
Calcium: 9.5 mg/dL (ref 8.9–10.3)
Chloride: 104 mmol/L (ref 98–111)
GFR, Est AFR Am: >60 mL/min (ref >60)
Glucose, Bld: 105 mg/dL — ABNORMAL HIGH (ref 70–99)
POTASSIUM: 3.7 mmol/L (ref 3.5–5.1)
Sodium: 142 mmol/L (ref 135–145)
TOTAL PROTEIN: 6.6 g/dL (ref 6.5–8.1)

## 2018-05-24 LAB — CBC WITH DIFFERENTIAL/PLATELET
Abs Immature Granulocytes: 0.01 10*3/uL (ref 0.00–0.07)
Basophils Absolute: 0 10*3/uL (ref 0.0–0.1)
Basophils Relative: 1 %
EOS ABS: 0.2 10*3/uL (ref 0.0–0.5)
Eosinophils Relative: 4 %
HEMATOCRIT: 37.1 % — AB (ref 39.0–52.0)
Hemoglobin: 12.5 g/dL — ABNORMAL LOW (ref 13.0–17.0)
IMMATURE GRANULOCYTES: 0 %
LYMPHS ABS: 0.8 10*3/uL (ref 0.7–4.0)
Lymphocytes Relative: 23 %
MCH: 30.9 pg (ref 26.0–34.0)
MCHC: 33.7 g/dL (ref 30.0–36.0)
MCV: 91.6 fL (ref 80.0–100.0)
MONO ABS: 0.4 10*3/uL (ref 0.1–1.0)
MONOS PCT: 12 %
Neutro Abs: 2.2 10*3/uL (ref 1.7–7.7)
Neutrophils Relative %: 60 %
Platelets: 180 10*3/uL (ref 150–400)
RBC: 4.05 MIL/uL — ABNORMAL LOW (ref 4.22–5.81)
RDW: 12.8 % (ref 11.5–15.5)
WBC: 3.7 10*3/uL — ABNORMAL LOW (ref 4.0–10.5)
nRBC: 0 % (ref 0.0–0.2)

## 2018-05-24 LAB — PHOSPHORUS: Phosphorus: 3.6 mg/dL (ref 2.5–4.6)

## 2018-05-24 LAB — MAGNESIUM: MAGNESIUM: 1.8 mg/dL (ref 1.7–2.4)

## 2018-05-24 MED ORDER — HEPARIN SOD (PORK) LOCK FLUSH 100 UNIT/ML IV SOLN
500.0000 [IU] | Freq: Once | INTRAVENOUS | Status: AC | PRN
  Administered 2018-05-24: 500 [IU]
  Filled 2018-05-24: qty 5

## 2018-05-24 MED ORDER — SODIUM CHLORIDE 0.9% FLUSH
10.0000 mL | INTRAVENOUS | Status: DC | PRN
  Administered 2018-05-24: 10 mL
  Filled 2018-05-24: qty 10

## 2018-05-24 NOTE — Progress Notes (Signed)
HEMATOLOGY/ONCOLOGY CLINIC NOTE  Date of Service: 05/24/2018  Patient Care Team: Celene Squibb, MD as PCP - General (Internal Medicine) Rozetta Nunnery, MD as Consulting Physician (Otolaryngology) Eppie Gibson, MD as Attending Physician (Radiation Oncology) Brunetta Genera, MD as Consulting Physician (Hematology) Leota Sauers, RN as Oncology Nurse Navigator Karie Mainland, RD as Dietitian (Nutrition) Valentino Saxon [place], CCC-SLP as Speech Language Pathologist (Speech Pathology) Wynelle Beckmann, Melodie Bouillon, PT as Physical Therapist (Physical Therapy) Kennith Center, LCSW as Social Worker  CHIEF COMPLAINTS/PURPOSE OF CONSULTATION:  F/u for Tonsillar carcinoma   HISTORY OF PRESENTING ILLNESS:   Travis Mcdonald is a wonderful 52 y.o. 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 He is accompanied today by his wife. The pt reports that he is doing well overall.   The pt reports first noticing an enlargement of his left neck around January 2019. He believed these to be sinus related since he took sinus medications which decreased the swelling. He notes aggravating pain but denies significant pain or problems swallowing. He reports left ear and jaw pain as well.   Four teeth extraction on 01/19/18 with radiation planning this afternoon and tentative radiation beginning 02/05/18.   He denies any ear infection concerns after his audiogram. He denies recurrent ear infections but notes that he had two ear infections last year.    The pt notes that he will try to continue work each day.  He adds that he has a skin rash on his right upper leg which is recurrent in the summers. He denies seeing a dermatologist in the past and treats the associated itching with cortisone and benadryl.   He notes that he does snore at night and his wife notes that he does not snore when he sleeps on his side.   Of note prior to the patient's  visit today, pt has had PET/CT completed on 01/04/18 with results revealing Extensive hypermetabolic activity in the left neck, index lesion maximum SUV 20.3. Looking for a primary lesion in the neck, there is some very faint asymmetry of the left palatine tonsillar pillar which could conceivably represent a small lesion. 2. No significant abnormal activity in the chest, abdomen/pelvis, or skeleton. 3. Other imaging findings of potential clinical significance: Aortic Atherosclerosis (ICD10-I70.0). Coronary atherosclerosis. Small mucous retention cyst in left maxillary sinus. Diffuse hepatic steatosis. Nonspecific but probably benign hypodense lesions in the lateral segment left hepatic lobe.   Most recent lab results (01/24/18) of CBC w/diff, and CMP  is as follows: all values are WNL except glucose at 162. Magnesium 01/24/18 is WNL at 1.8 Phosphorus 01/24/18 is WNL at 2.7  On review of systems, pt reports left ear pain, left neck discomfort, left jaw pain, upper right leg rash, and denies abdominal pains, other skin rashes, and any other symptoms.   On PMHx the pt reports 1998 and 1999 back surgeries for ruptured disks with microdiscectomy, high blood pressure, denies diabetes, heart, lung or kidney problems. 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 Denies ever smoking cigarettes. Denies ETOH consumption in 30 years. Reports infrequent marijuana smoking 30 years ago.  On Family Hx the pt reports maternal cousin with brain cancer, maternal cousin with pancreatic cancer, maternal uncle with cancer. Denies much paternal knowledge.   Interval History:   Travis Mcdonald returns today regarding his tonsillar carcinoma. The patient's last visit with [place] was  on 04/26/18. The pt reports that he is doing well overall.   The pt reports that he is not having any fevers or chills. He is having some clear sinus drainage and notes that he normally gets environmental allergies at  this time of year. He has continued to have thick saliva as well, and also endorses occasional throat pain. He is able to talk without throat pain, though when he swallows he has some faint throat pain. He has been consuming potato soup and chicken noodle soup, yogurt and pudding. The pt adds that his cough is worse at night but is overall better.   The pt notes that he is able to continue staying well hydrated as well. He lost two pounds in the interim and has been eating much better. He continues using his feeding tube, with 4 boxes each day. He notes he is consuming 50:50 tube to by mouth and has continued to see our nutritional therapist Ernestene Kiel.   Lab results today (05/24/18) of CBC w/diff, CMP, and Reticulocytes is as follows: all values are WNL except for WBC at 3.7k, RBC at 4.05, HGB at 12.5, HCT at 37.1, Glucose at 105, AST at 13. 05/24/18 Magnesium is at 1.8 05/24/18 Phosphorous is at 3.6  On review of systems, pt reports good energy levels, clear nasal discharge, thick saliva, improving cough, moving his bowels well, and denies fevers, chills, changes in hearing, leg swelling, abdominal pains, and any other symptoms.   MEDICAL HISTORY:  Past Medical History:  Diagnosis Date  . Hypertension     SURGICAL HISTORY: Past Surgical History:  Procedure Laterality Date  . [place]   ruptured lower back disc surgery. L4 & L5 Lumber disc  . IR GASTROSTOMY TUBE MOD SED  02/02/2018  . IR IMAGING GUIDED PORT INSERTION  02/02/2018    SOCIAL HISTORY: Social History   Socioeconomic History  . Marital status: Married    Spouse name: Not on file  . Number of children: Not on file  . Years of education: Not on file  . Highest education level: Not on file  Occupational History  . Not on file  Social Needs  . Financial resource strain: Not on file  . Food insecurity:    Worry: Not on file    Inability: Not on file  . Transportation needs:    Medical: Not on file     Non-medical: Not on file  Tobacco Use  . Smoking status: Never Smoker  . Smokeless tobacco: Never Used  Substance and Sexual Activity  . Alcohol use: No    Comment: no alcohol for 30 years.   . Drug use: No  . Sexual activity: Not on file  Lifestyle  . Physical activity:    Days per week: Not on file    Minutes per session: Not on file  . Stress: Not on file  Relationships  . Social connections:    Talks on phone: Not on file    Gets together: Not on file    Attends religious service: Not on file    Active member of club or organization: Not on file    Attends meetings of clubs or organizations: Not on file    Relationship status: Not on file  . Intimate partner violence:    Fear of current or ex partner: No    Emotionally abused: No    Physically abused: No    Forced sexual activity: No  Other Topics Concern  .  Not on file  Social History Narrative  . Not on file    FAMILY HISTORY: History reviewed. No pertinent family history.  ALLERGIES:  has No Known Allergies.  MEDICATIONS:  Current Outpatient Medications  Medication Sig Dispense Refill  . benzonatate (TESSALON) 100 MG capsule Take 1 capsule (100 mg total) by mouth 3 (three) times daily as needed for cough. (Patient not taking: Reported on 04/18/2018) 20 capsule 0  . clotrimazole (LOTRIMIN AF) 1 % cream Apply 1 application topically 2 (two) times daily. To rash on rt leg (Patient not taking: Reported on 05/24/2018) 60 g 0  . dexamethasone (DECADRON) 4 MG tablet Take 2 tablets by mouth once a day on the day after chemotherapy and then take 2 tablets two times a day for 2 days. Take with food. (Patient not taking: Reported on 04/18/2018) 30 tablet 1  . enalapril-hydrochlorothiazide (VASERETIC) 10-25 MG tablet Take 1 tablet by mouth daily.     Marland Kitchen lidocaine (XYLOCAINE) 2 % solution Patient: Mix 1part 2% viscous lidocaine, 1part H20. Swish & swallow 44mL of diluted mixture, 62min before meals and at bedtime, up to QID (Patient  not taking: Reported on 04/18/2018) 100 mL 5  . lidocaine-prilocaine (EMLA) cream Apply to affected area once 30 g 3  . LORazepam (LORAZEPAM INTENSOL) 2 MG/ML concentrated solution Take 0.5 mLs (1 mg total) by mouth as directed. 30 mins prior to radiation (Patient not taking: Reported on 04/18/2018) 15 mL 0  . metoCLOPramide (REGLAN) 5 MG/5ML solution Place 5 mLs (5 mg total) into feeding tube 3 (three) times daily with meals as needed for nausea or vomiting. Instead of Compazine. (Patient not taking: Reported on 04/18/2018) 120 mL 0  . Nutritional Supplements (FEEDING SUPPLEMENT, OSMOLITE 1.5 CAL,) LIQD Begin 1 carton Osmolite 1.5 via PEG QID with 120 mL free water before and after. Increase to 1 1/2 cartons on Thursday as tolerated. Increase to 2 cartons bid and 1 1/2 cartons bid on Saturday. Flush tube with additional 240 mL free water TID between feedings. 7 Bottle 0  . ondansetron (ZOFRAN) 8 MG tablet Take 1 tablet (8 mg total) by mouth 2 (two) times daily as needed. Start on the third day after chemotherapy. (Patient not taking: Reported on 04/18/2018) 30 tablet 1  . oxyCODONE (ROXICODONE INTENSOL) 20 MG/ML concentrated solution Take 0.5 mLs (10 mg total) by mouth every 4 (four) hours as needed for severe pain or breakthrough pain. (Patient not taking: Reported on 04/18/2018) 30 mL 0  . potassium chloride (KLOR-CON) 20 MEQ packet Place 20 mEq into feeding tube daily. Mixed in 169ml of water (Patient not taking: Reported on 04/18/2018) 10 packet 0  . senna-docusate (SENNA S) 8.6-50 MG tablet Take 2 tablets by mouth at bedtime as needed for mild constipation or moderate constipation. 60 tablet 1  . sodium fluoride (FLUORISHIELD) 1.1 % GEL dental gel Instill one drop of gel per tooth space of fluoride tray. Place over teeth for 5 minutes. Remove. Spit out excess. Repeat nightly. 120 mL prn   No current facility-administered medications for this visit.     REVIEW OF SYSTEMS:    A 10+ POINT REVIEW OF SYSTEMS  WAS OBTAINED including neurology, dermatology, psychiatry, cardiac, respiratory, lymph, extremities, GI, GU, Musculoskeletal, constitutional, breasts, reproductive, HEENT.  All pertinent positives are noted in the HPI.  All others are negative.   PHYSICAL EXAMINATION: ECOG PERFORMANCE STATUS: 1 - Symptomatic but completely ambulatory  VS reviewed  GENERAL:alert, in no acute distress and comfortable SKIN: no  acute rashes, no significant lesions EYES: conjunctiva are pink and non-injected, sclera anicteric OROPHARYNX: MMM, no exudates, no oropharyngeal erythema or ulceration NECK: supple, no JVD LYMPH:  no palpable lymphadenopathy in the cervical, axillary or inguinal regions LUNGS: clear to auscultation b/l with normal respiratory effort. Port a cath in situ.  HEART: regular rate & rhythm ABDOMEN:  normoactive bowel sounds , non tender, not distended. No palpable hepatosplenomegaly. Feeding tube in situ.  Extremity: no pedal edema PSYCH: alert & oriented x 3 with fluent speech NEURO: no focal motor/sensory deficits   LABORATORY DATA:  I have reviewed the data as listed  . CBC Latest Ref Rng & Units 05/24/2018 04/26/2018 04/06/2018  WBC 4.0 - 10.5 K/uL 3.7(L) 3.9(L) 6.5  Hemoglobin 13.0 - 17.0 g/dL 12.5(L) 10.9(L) 11.5(L)  Hematocrit 39.0 - 52.0 % 37.1(L) 32.6(L) 34.1(L)  Platelets 150 - 400 K/uL 180 171 345   . CBC    Component Value Date/Time   WBC 3.7 (L) 05/24/2018 1320   RBC 4.05 (L) 05/24/2018 1320   HGB 12.5 (L) 05/24/2018 1320   HGB 16.6 02/12/2018 1341   HCT 37.1 (L) 05/24/2018 1320   PLT 180 05/24/2018 1320   PLT 250 02/12/2018 1341   MCV 91.6 05/24/2018 1320   MCH 30.9 05/24/2018 1320   MCHC 33.7 05/24/2018 1320   RDW 12.8 05/24/2018 1320   LYMPHSABS 0.8 05/24/2018 1320   MONOABS 0.4 05/24/2018 1320   EOSABS 0.2 05/24/2018 1320   BASOSABS 0.0 05/24/2018 1320    . CMP Latest Ref Rng & Units 05/24/2018 04/26/2018 04/06/2018  Glucose 70 - 99 mg/dL 105(H) 128(H)  92  BUN 6 - 20 mg/dL 11 13 23(H)  Creatinine 0.61 - 1.24 mg/dL 0.79 0.83 1.16  Sodium 135 - 145 mmol/L 142 140 139  Potassium 3.5 - 5.1 mmol/L 3.7 3.6 3.2(L)  Chloride 98 - 111 mmol/L 104 102 94(L)  CO2 22 - 32 mmol/L 28 30 35(H)  Calcium 8.9 - 10.3 mg/dL 9.5 9.5 9.6  Total Protein 6.5 - 8.1 g/dL 6.6 6.1(L) 7.0  Total Bilirubin 0.3 - 1.2 mg/dL 0.4 0.4 0.6  Alkaline Phos 38 - 126 U/L 77 68 86  AST 15 - 41 U/L 13(L) 13(L) 28  ALT 0 - 44 U/L 15 15 201(H)   Component     Latest Ref Rng & Units 04/26/2018  Magnesium     1.7 - 2.4 mg/dL 1.8  Phosphorus     2.5 - 4.6 mg/dL 3.4  TSH     0.320 - 4.118 uIU/mL 1.872     11/10/17 Comprehensive Hearing Test:    12/27/17 Surgical Pathology:    RADIOGRAPHIC STUDIES: I have personally reviewed the radiological images as listed and agreed with the findings in the report. No results found.  ASSESSMENT & PLAN:   52 y.o. 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 Constipation  3. Radiation Mucositis - grade 2 - I much improved  PLAN:  -Continue with PET/CT as ordered by Dr. Isidore Moos on 07/16/18 -Recommended steam inhalation -No indication for barium swallow study -Recommended that the pt continue to slowly increased PO intake -Discussed pt labwork today, 05/24/18; anemia nearly resolved with HGB to 12.5, chemistries are improved revealing no  evidence of overt dehydration  -Use one teaspoon of olive oil prior to meals -Continue salt and baking soda mouthwashes -Continue using antibiotic ointment Bacitracin around feeding tube  -Will see the pt back in 2 months after PET/CT  4. Eczematoid skin rash on his lower extremities RT>left.  Resolved with anti fungal - fluconazole. Plan -completed po fluconazole with near resolution of rash -conitnue topical anti-fungals . -Continue moisturizing previously fungal-affected skin areas on bilateral LE, use colloidal silver soap for anti-fungal prevention    RTC with Dr Irene Limbo in 2 months with labs    All of the patients questions were answered with apparent satisfaction. The patient knows to call the clinic with any problems, questions or concerns.  The total time spent in the appt was 20 minutes and more than 50% was on counseling and direct patient cares.     Sullivan Lone MD MS AAHIVMS [facility] Hematology/Oncology Physician [facility]  (Office):       [phone] (Work cell):  [phone] (Fax):           [phone]  05/24/2018 3:26 PM  I, Baldwin [place], am acting as a scribe for Dr. Irene Limbo  .I have reviewed the above documentation for accuracy and completeness, and I agree with the above. Brunetta Genera MD

## 2018-05-24 NOTE — Telephone Encounter (Signed)
Spoke with patient concerning his upcoming appointment. Per 10/10 los. Patient requested to be scheduled on the 3rd due to a appointment with Isidore Moos.

## 2018-06-18 ENCOUNTER — Ambulatory Visit: Payer: PRIVATE HEALTH INSURANCE

## 2018-06-19 ENCOUNTER — Inpatient Hospital Stay: Payer: PRIVATE HEALTH INSURANCE | Attending: Hematology | Admitting: Nutrition

## 2018-07-11 ENCOUNTER — Encounter: Payer: Self-pay | Admitting: Radiation Oncology

## 2018-07-11 NOTE — Progress Notes (Signed)
Travis Mcdonald presents for follow up of radiation completed 03/28/18 to his left tonsil and bilateral neck.    Pain issues, if any: He denies.  Using a feeding tube?: He still has a feeding tube in place. He does not use the feeding tube.  Weight changes, if any:  Wt Readings from Last 3 Encounters:  07/17/18 229 lb 3.2 oz (104 kg)  05/24/18 239 lb 9.6 oz (108.7 kg)  05/21/18 239 lb 9.6 oz (108.7 kg)   Swallowing issues, if any: He needs to use liquid to help his food go down his throat. He does have a decreased appetite and needs to be encouraged to eat.  Smoking or chewing tobacco? No Using fluoride trays daily? No, he is not using them.  Last ENT visit was on: Dr. Lucia Gaskins not since diagnosis.  Other notable issues, if any:  PET 07/16/18 Dr. Irene Limbo last on 05/24/18 and will see him today also.  BP 135/75 (BP Location: Right Arm, Patient Position: Sitting)   Pulse 66   Temp 98.3 F (36.8 C) (Oral)   Resp 20   Ht 6\' 2"  (1.88 m)   Wt 229 lb 3.2 oz (104 kg)   SpO2 100%   BMI 29.43 kg/m

## 2018-07-16 ENCOUNTER — Ambulatory Visit
Admission: RE | Admit: 2018-07-16 | Discharge: 2018-07-16 | Disposition: A | Discharge status: Home / Self Care | Payer: PRIVATE HEALTH INSURANCE | Source: Ambulatory Visit | Attending: Radiation Oncology | Admitting: Radiation Oncology

## 2018-07-16 ENCOUNTER — Ambulatory Visit (HOSPITAL_COMMUNITY)
Admission: RE | Admit: 2018-07-16 | Discharge: 2018-07-16 | Disposition: A | Discharge status: Home / Self Care | Payer: PRIVATE HEALTH INSURANCE | Source: Ambulatory Visit | Attending: Radiation Oncology | Admitting: Radiation Oncology

## 2018-07-16 ENCOUNTER — Inpatient Hospital Stay: Payer: PRIVATE HEALTH INSURANCE

## 2018-07-16 DIAGNOSIS — IMO0002 Reserved for concepts with insufficient information to code with codable children: Secondary | ICD-10-CM

## 2018-07-16 DIAGNOSIS — Z79899 Other long term (current) drug therapy: Secondary | ICD-10-CM | POA: Insufficient documentation

## 2018-07-16 DIAGNOSIS — C799 Secondary malignant neoplasm of unspecified site: Secondary | ICD-10-CM

## 2018-07-16 DIAGNOSIS — C090 Malignant neoplasm of tonsillar fossa: Secondary | ICD-10-CM | POA: Insufficient documentation

## 2018-07-16 DIAGNOSIS — Z95828 Presence of other vascular implants and grafts: Secondary | ICD-10-CM

## 2018-07-16 LAB — CBC WITH DIFFERENTIAL/PLATELET
ABS IMMATURE GRANULOCYTES: 0.01 10*3/uL (ref 0.00–0.07)
BASOS PCT: 0 %
Basophils Absolute: 0 10*3/uL (ref 0.0–0.1)
Eosinophils Absolute: 0.2 10*3/uL (ref 0.0–0.5)
Eosinophils Relative: 5 %
HCT: 40.5 % (ref 39.0–52.0)
Hemoglobin: 13.1 g/dL (ref 13.0–17.0)
Immature Granulocytes: 0 %
LYMPHS ABS: 0.8 10*3/uL (ref 0.7–4.0)
LYMPHS PCT: 24 %
MCH: 28.4 pg (ref 26.0–34.0)
MCHC: 32.3 g/dL (ref 30.0–36.0)
MCV: 87.9 fL (ref 80.0–100.0)
MONO ABS: 0.4 10*3/uL (ref 0.1–1.0)
Monocytes Relative: 10 %
Neutro Abs: 2 10*3/uL (ref 1.7–7.7)
Neutrophils Relative %: 61 %
PLATELETS: 148 10*3/uL — AB (ref 150–400)
RBC: 4.61 MIL/uL (ref 4.22–5.81)
RDW: 13 % (ref 11.5–15.5)
WBC: 3.4 10*3/uL — AB (ref 4.0–10.5)
nRBC: 0 % (ref 0.0–0.2)

## 2018-07-16 LAB — TSH: TSH: 2.776 u[IU]/mL (ref 0.320–4.118)

## 2018-07-16 LAB — CMP (CANCER CENTER ONLY)
ALT: 8 U/L (ref 0–44)
AST: 11 U/L — ABNORMAL LOW (ref 15–41)
Albumin: 3.7 g/dL (ref 3.5–5.0)
Alkaline Phosphatase: 63 U/L (ref 38–126)
Anion gap: 10 (ref 5–15)
BUN: 9 mg/dL (ref 6–20)
CHLORIDE: 108 mmol/L (ref 98–111)
CO2: 25 mmol/L (ref 22–32)
CREATININE: 0.82 mg/dL (ref 0.61–1.24)
Calcium: 9.5 mg/dL (ref 8.9–10.3)
GFR, Est AFR Am: >60 mL/min (ref >60)
Glucose, Bld: 101 mg/dL — ABNORMAL HIGH (ref 70–99)
Potassium: 3.8 mmol/L (ref 3.5–5.1)
Sodium: 143 mmol/L (ref 135–145)
Total Bilirubin: 0.4 mg/dL (ref 0.3–1.2)
Total Protein: 6.6 g/dL (ref 6.5–8.1)

## 2018-07-16 LAB — GLUCOSE, CAPILLARY: Glucose-Capillary: 96 mg/dL (ref 70–99)

## 2018-07-16 LAB — MAGNESIUM: MAGNESIUM: 1.8 mg/dL (ref 1.7–2.4)

## 2018-07-16 LAB — PHOSPHORUS: Phosphorus: 3.4 mg/dL (ref 2.5–4.6)

## 2018-07-16 MED ORDER — SODIUM CHLORIDE 0.9% FLUSH
10.0000 mL | INTRAVENOUS | Status: DC | PRN
  Administered 2018-07-16: 10 mL
  Filled 2018-07-16: qty 10

## 2018-07-16 MED ORDER — FLUDEOXYGLUCOSE F - 18 (FDG) INJECTION
11.5300 | Freq: Once | INTRAVENOUS | Status: AC | PRN
  Administered 2018-07-16: 11.53 via INTRAVENOUS

## 2018-07-16 NOTE — Progress Notes (Signed)
HEMATOLOGY/ONCOLOGY CLINIC NOTE  Date of Service: 07/17/2018  Patient Care Team: Celene Squibb, MD as PCP - General (Internal Medicine) Rozetta Nunnery, MD as Consulting Physician (Otolaryngology) Eppie Gibson, MD as Attending Physician (Radiation Oncology) Brunetta Genera, MD as Consulting Physician (Hematology) Leota Sauers, RN as Oncology Nurse Navigator Karie Mainland, RD as Dietitian (Nutrition) Valentino Saxon [place], CCC-SLP as Speech Language Pathologist (Speech Pathology) Wynelle Beckmann, Melodie Bouillon, PT as Physical Therapist (Physical Therapy) Kennith Center, LCSW as Social Worker  CHIEF COMPLAINTS/PURPOSE OF CONSULTATION:  F/u for Tonsillar carcinoma   HISTORY OF PRESENTING ILLNESS:   Travis Mcdonald is a wonderful 52 y.o. 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 He is accompanied today by his wife. The pt reports that he is doing well overall.   The pt reports first noticing an enlargement of his left neck around January 2019. He believed these to be sinus related since he took sinus medications which decreased the swelling. He notes aggravating pain but denies significant pain or problems swallowing. He reports left ear and jaw pain as well.   Four teeth extraction on 01/19/18 with radiation planning this afternoon and tentative radiation beginning 02/05/18.   He denies any ear infection concerns after his audiogram. He denies recurrent ear infections but notes that he had two ear infections last year.    The pt notes that he will try to continue work each day.  He adds that he has a skin rash on his right upper leg which is recurrent in the summers. He denies seeing a dermatologist in the past and treats the associated itching with cortisone and benadryl.   He notes that he does snore at night and his wife notes that he does not snore when he sleeps on his side.   Of note prior to the patient's  visit today, pt has had PET/CT completed on 01/04/18 with results revealing Extensive hypermetabolic activity in the left neck, index lesion maximum SUV 20.3. Looking for a primary lesion in the neck, there is some very faint asymmetry of the left palatine tonsillar pillar which could conceivably represent a small lesion. 2. No significant abnormal activity in the chest, abdomen/pelvis, or skeleton. 3. Other imaging findings of potential clinical significance: Aortic Atherosclerosis (ICD10-I70.0). Coronary atherosclerosis. Small mucous retention cyst in left maxillary sinus. Diffuse hepatic steatosis. Nonspecific but probably benign hypodense lesions in the lateral segment left hepatic lobe.   Most recent lab results (01/24/18) of CBC w/diff, and CMP  is as follows: all values are WNL except glucose at 162. Magnesium 01/24/18 is WNL at 1.8 Phosphorus 01/24/18 is WNL at 2.7  On review of systems, pt reports left ear pain, left neck discomfort, left jaw pain, upper right leg rash, and denies abdominal pains, other skin rashes, and any other symptoms.   On PMHx the pt reports 1998 and 1999 back surgeries for ruptured disks with microdiscectomy, high blood pressure, denies diabetes, heart, lung or kidney problems. 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 Denies ever smoking cigarettes. Denies ETOH consumption in 30 years. Reports infrequent marijuana smoking 30 years ago.  On Family Hx the pt reports maternal cousin with brain cancer, maternal cousin with pancreatic cancer, maternal uncle with cancer. Denies much paternal knowledge.   Interval History:   Travis Mcdonald returns today regarding his tonsillar carcinoma. The patient's last visit with [place] was  on 05/24/18. He is accompanied today by his wife. The pt reports that he is doing well overall.   The pt reports that he has been able to eat completely by mouth and is able to swallow all foods except some breads and  steak. He notes that he eats about half of what he used to eat and feels that his "stomach may have shrunk." He notes that his weight decreased to 225 pounds but has increased 5 pounds in the past 1-2 weeks. He eats three times a day and denies skipping any meals. He notes that he eats about 50% of what he used to eat prior to diagnosis, and some of this is also due to change in taste.   The pt also notes that his voice has continued to normalize, and his throat no longer hurts when he talks.   The pt notes that he has a productive cough in the mornings.   Of note since the patient's last visit, pt has had a PET/CT completed on 07/16/18 with results revealing New rounded consolidation in the medial left lower lobe shows abnormal hypermetabolism and may be infectious or inflammatory in etiology. Malignancy cannot be excluded. Consider follow-up CT chest without contrast in 3-4 weeks in further initial evaluation, as clinically indicated. 2. Mild focal hypermetabolism anterior to the left aspect of the hyoid bone, without a definite CT correlate. 3. Tiny left renal stone.  Lab results (07/16/18) of CBC w/diff and CMP is as follows: all values are WNL except for WBC at 3.4k, PLT at 148k, Glucose at 101, AST at 11. 07/16/18 TSH at 2.776 07/16/18 Phosphorous at 3.4 07/16/18 Magnesium at 1.8  On review of systems, pt reports eating well, less appetite, some weight gain, improved voice, productive cough, and denies choking, problems swallowing, abdominal pain, leg swelling, and any other symptoms.   MEDICAL HISTORY:  Past Medical History:  Diagnosis Date  . History of radiation therapy 02/05/18- 03/28/18   Left tonsil and neck bilaterally, 2 Gy X 35 fractions for a total dose of 70 Gy.   Marland Kitchen Hypertension     SURGICAL HISTORY: Past Surgical History:  Procedure Laterality Date  . [place]   ruptured lower back disc surgery. L4 & L5 Lumber disc  . IR GASTROSTOMY TUBE MOD SED  02/02/2018  .  IR IMAGING GUIDED PORT INSERTION  02/02/2018    SOCIAL HISTORY: Social History   Socioeconomic History  . Marital status: Married    Spouse name: Not on file  . Number of children: Not on file  . Years of education: Not on file  . Highest education level: Not on file  Occupational History  . Not on file  Social Needs  . Financial resource strain: Not on file  . Food insecurity:    Worry: Not on file    Inability: Not on file  . Transportation needs:    Medical: No    Non-medical: No  Tobacco Use  . Smoking status: Never Smoker  . Smokeless tobacco: Never Used  Substance and Sexual Activity  . Alcohol use: No    Comment: no alcohol for 30 years.   . Drug use: No  . Sexual activity: Not on file  Lifestyle  . Physical activity:    Days per week: Not on file    Minutes per session: Not on file  . Stress: Not on file  Relationships  . Social connections:    Talks on phone: Not on file  Gets together: Not on file    Attends religious service: Not on file    Active member of club or organization: Not on file    Attends meetings of clubs or organizations: Not on file    Relationship status: Not on file  . Intimate partner violence:    Fear of current or ex partner: No    Emotionally abused: No    Physically abused: No    Forced sexual activity: No  Other Topics Concern  . Not on file  Social History Narrative  . Not on file    FAMILY HISTORY: No family history on file.  ALLERGIES:  has No Known Allergies.  MEDICATIONS:  Current Outpatient Medications  Medication Sig Dispense Refill  . benzonatate (TESSALON) 100 MG capsule Take 1 capsule (100 mg total) by mouth 3 (three) times daily as needed for cough. (Patient not taking: Reported on 04/18/2018) 20 capsule 0  . clotrimazole (LOTRIMIN AF) 1 % cream Apply 1 application topically 2 (two) times daily. To rash on rt leg (Patient not taking: Reported on 05/24/2018) 60 g 0  . enalapril-hydrochlorothiazide (VASERETIC)  10-25 MG tablet Take 1 tablet by mouth daily.     Marland Kitchen lidocaine (XYLOCAINE) 2 % solution Patient: Mix 1part 2% viscous lidocaine, 1part H20. Swish & swallow 79mL of diluted mixture, 39min before meals and at bedtime, up to QID (Patient not taking: Reported on 04/18/2018) 100 mL 5  . lidocaine-prilocaine (EMLA) cream Apply to affected area once 30 g 3  . LORazepam (LORAZEPAM INTENSOL) 2 MG/ML concentrated solution Take 0.5 mLs (1 mg total) by mouth as directed. 30 mins prior to radiation (Patient not taking: Reported on 04/18/2018) 15 mL 0  . metoCLOPramide (REGLAN) 5 MG/5ML solution Place 5 mLs (5 mg total) into feeding tube 3 (three) times daily with meals as needed for nausea or vomiting. Instead of Compazine. (Patient not taking: Reported on 04/18/2018) 120 mL 0  . Nutritional Supplements (FEEDING SUPPLEMENT, OSMOLITE 1.5 CAL,) LIQD Begin 1 carton Osmolite 1.5 via PEG QID with 120 mL free water before and after. Increase to 1 1/2 cartons on Thursday as tolerated. Increase to 2 cartons bid and 1 1/2 cartons bid on Saturday. Flush tube with additional 240 mL free water TID between feedings. (Patient not taking: Reported on 07/17/2018) 7 Bottle 0  . ondansetron (ZOFRAN) 8 MG tablet Take 1 tablet (8 mg total) by mouth 2 (two) times daily as needed. Start on the third day after chemotherapy. (Patient not taking: Reported on 04/18/2018) 30 tablet 1  . oxyCODONE (ROXICODONE INTENSOL) 20 MG/ML concentrated solution Take 0.5 mLs (10 mg total) by mouth every 4 (four) hours as needed for severe pain or breakthrough pain. (Patient not taking: Reported on 04/18/2018) 30 mL 0  . potassium chloride (KLOR-CON) 20 MEQ packet Place 20 mEq into feeding tube daily. Mixed in 169ml of water (Patient not taking: Reported on 04/18/2018) 10 packet 0  . senna-docusate (SENNA S) 8.6-50 MG tablet Take 2 tablets by mouth at bedtime as needed for mild constipation or moderate constipation. (Patient not taking: Reported on 07/17/2018) 60 tablet 1  .  sodium fluoride (FLUORISHIELD) 1.1 % GEL dental gel Instill one drop of gel per tooth space of fluoride tray. Place over teeth for 5 minutes. Remove. Spit out excess. Repeat nightly. (Patient not taking: Reported on 07/17/2018) 120 mL prn   No current facility-administered medications for this visit.     REVIEW OF SYSTEMS:    A 10+ POINT REVIEW  OF SYSTEMS WAS OBTAINED including neurology, dermatology, psychiatry, cardiac, respiratory, lymph, extremities, GI, GU, Musculoskeletal, constitutional, breasts, reproductive, HEENT.  All pertinent positives are noted in the HPI.  All others are negative.   PHYSICAL EXAMINATION: ECOG PERFORMANCE STATUS: 1 - Symptomatic but completely ambulatory  VS reviewed  GENERAL:alert, in no acute distress and comfortable SKIN: no acute rashes, no significant lesions EYES: conjunctiva are pink and non-injected, sclera anicteric OROPHARYNX: MMM, no exudates, no oropharyngeal erythema or ulceration NECK: supple, no JVD LYMPH:  no palpable lymphadenopathy in the cervical, axillary or inguinal regions LUNGS: clear to auscultation b/l with normal respiratory effort HEART: regular rate & rhythm ABDOMEN:  normoactive bowel sounds , non tender, not distended. No palpable hepatosplenomegaly. Feeding tube in situ  Extremity: no pedal edema PSYCH: alert & oriented x 3 with fluent speech NEURO: no focal motor/sensory deficits   LABORATORY DATA:  I have reviewed the data as listed  . CBC Latest Ref Rng & Units 07/16/2018 05/24/2018 04/26/2018  WBC 4.0 - 10.5 K/uL 3.4(L) 3.7(L) 3.9(L)  Hemoglobin 13.0 - 17.0 g/dL 13.1 12.5(L) 10.9(L)  Hematocrit 39.0 - 52.0 % 40.5 37.1(L) 32.6(L)  Platelets 150 - 400 K/uL 148(L) 180 171   . CBC    Component Value Date/Time   WBC 3.4 (L) 07/16/2018 0847   RBC 4.61 07/16/2018 0847   HGB 13.1 07/16/2018 0847   HGB 16.6 02/12/2018 1341   HCT 40.5 07/16/2018 0847   PLT 148 (L) 07/16/2018 0847   PLT 250 02/12/2018 1341   MCV 87.9  07/16/2018 0847   MCH 28.4 07/16/2018 0847   MCHC 32.3 07/16/2018 0847   RDW 13.0 07/16/2018 0847   LYMPHSABS 0.8 07/16/2018 0847   MONOABS 0.4 07/16/2018 0847   EOSABS 0.2 07/16/2018 0847   BASOSABS 0.0 07/16/2018 0847    . CMP Latest Ref Rng & Units 07/16/2018 05/24/2018 04/26/2018  Glucose 70 - 99 mg/dL 101(H) 105(H) 128(H)  BUN 6 - 20 mg/dL 9 11 13   Creatinine 0.61 - 1.24 mg/dL 0.82 0.79 0.83  Sodium 135 - 145 mmol/L 143 142 140  Potassium 3.5 - 5.1 mmol/L 3.8 3.7 3.6  Chloride 98 - 111 mmol/L 108 104 102  CO2 22 - 32 mmol/L 25 28 30   Calcium 8.9 - 10.3 mg/dL 9.5 9.5 9.5  Total Protein 6.5 - 8.1 g/dL 6.6 6.6 6.1(L)  Total Bilirubin 0.3 - 1.2 mg/dL 0.4 0.4 0.4  Alkaline Phos 38 - 126 U/L 63 77 68  AST 15 - 41 U/L 11(L) 13(L) 13(L)  ALT 0 - 44 U/L 8 15 15    Component     Latest Ref Rng & Units 04/26/2018  Magnesium     1.7 - 2.4 mg/dL 1.8  Phosphorus     2.5 - 4.6 mg/dL 3.4  TSH     0.320 - 4.118 uIU/mL 1.872     11/10/17 Comprehensive Hearing Test:    12/27/17 Surgical Pathology:    RADIOGRAPHIC STUDIES: I have personally reviewed the radiological images as listed and agreed with the findings in the report. Nm Pet Image Restag (ps) Skull Base To Thigh  Result Date: 07/16/2018 CLINICAL DATA:  Subsequent treatment strategy for tonsillar cancer. EXAM: NUCLEAR MEDICINE PET SKULL BASE TO THIGH TECHNIQUE: 11.5 mCi F-18 FDG was injected intravenously. Full-ring PET imaging was performed from the skull base to thigh after the radiotracer. CT data was obtained and used for attenuation correction and anatomic localization. Fasting blood glucose: 86 mg/dl COMPARISON:  01/04/2018.  Neck CT 12/20/2017.  FINDINGS: Mediastinal blood pool activity: SUV max 2.6 NECK: Mild focal hypermetabolism is seen anterior to the left aspect of the hyoid bone (SUV max 3.9) without a definite CT correlate. No hypermetabolic lymph nodes. Incidental CT findings: None. CHEST: No hypermetabolic  mediastinal, hilar or axillary lymph nodes. New rounded consolidation in the medial left lower lobe has an SUV max of 4.7. Incidental CT findings: Right IJ Port-A-Cath terminates in the high right atrium. Coronary artery calcification. No pericardial or pleural effusion. 4 mm nodule along the right major fissure is too small for PET resolution. ABDOMEN/PELVIS: No abnormal hypermetabolism in the liver, adrenal glands, spleen or pancreas. No hypermetabolic lymph nodes. Incidental CT findings: Low-attenuation lesions in the liver are difficult to characterize due to size and lack of postcontrast imaging. Gallbladder, adrenal glands and right kidney are unremarkable. Tiny stone in the left kidney. Spleen and pancreas are grossly unremarkable. Percutaneous gastrostomy tube. Duodenal diverticulum. SKELETON: No abnormal osseous hypermetabolism. Incidental CT findings: None. IMPRESSION: 1. New rounded consolidation in the medial left lower lobe shows abnormal hypermetabolism and may be infectious or inflammatory in etiology. Malignancy cannot be excluded. Consider follow-up CT chest without contrast in 3-4 weeks in further initial evaluation, as clinically indicated. 2. Mild focal hypermetabolism anterior to the left aspect of the hyoid bone, without a definite CT correlate. 3. Tiny left renal stone. Electronically Signed   By: Lorin Picket M.D.   On: 07/16/2018 11:05    ASSESSMENT & PLAN:   52 y.o. 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 Constipation  3. Radiation Mucositis - grade 2 -resolved  4. Nodule in LLL of lung PLAN:  -Discussed pt labwork from 07/16/18; some leukopenia with WBC at 3.4k  without neutropenia, PLT borderline low at 148k, anemia resolved with HGB increased to 13.1. TSH is normal. Electrolytes are normal.  -Discussed the 07/16/18 PET/CT which revealed New rounded consolidation in the medial left lower lobe shows abnormal hypermetabolism and may be infectious or inflammatory in etiology. Malignancy cannot be excluded. Consider follow-up CT chest without contrast in 3-4 weeks in further initial evaluation, as clinically indicated. 2. Mild focal hypermetabolism anterior to the left aspect of the hyoid bone, without a definite CT correlate. 3. Tiny left renal stone.  -Patient will proceed with CT Chest for further evaluation of the rounded consolidation in the medial left lower lobe as ordered on 08/09/2018 by Dr Isidore Moos. -Will refer pt to IR for feeding tube removal and port removal -Encouraged the patient to supplement his 3 meals a day with Boost or Ensure  -No indication for barium swallow study -Will see the pt back in 2 months   5. Eczematoid skin rash on his lower extremities RT>left. Resolved with anti fungal - fluconazole. Plan -completed po fluconazole with near resolution of rash -conitnue topical anti-fungals . -Continue moisturizing previously fungal-affected skin areas on bilateral LE, use colloidal silver soap for anti-fungal prevention    IR for G tube and port a cath removal in 1-2 weeks RTC with Dr Irene Limbo in 2 months with labs     All of the patients questions were answered with apparent satisfaction. The patient knows to call the clinic with any problems, questions or concerns.  The total  time spent in the appt was 25 minutes and more than 50% was on counseling and direct patient cares.     Sullivan Lone MD MS AAHIVMS [facility] Hematology/Oncology Physician [facility]  (Office):       [phone] (Work cell):  [phone] (Fax):           [phone]  07/17/2018 3:35 PM  I, Baldwin Jamaica, am acting as a scribe for Dr. Sullivan Lone.   .I have reviewed the above documentation for accuracy and completeness, and I agree with the above. Brunetta Genera MD

## 2018-07-16 NOTE — Patient Instructions (Signed)
Implanted Port Home Guide An implanted port is a type of central line that is placed under the skin. Central lines are used to provide IV access when treatment or nutrition needs to be given through a person's veins. Implanted ports are used for long-term IV access. An implanted port may be placed because:  You need IV medicine that would be irritating to the small veins in your hands or arms.  You need long-term IV medicines, such as antibiotics.  You need IV nutrition for a long period.  You need frequent blood draws for lab tests.  You need dialysis.  Implanted ports are usually placed in the chest area, but they can also be placed in the upper arm, the abdomen, or the leg. An implanted port has two main parts:  Reservoir. The reservoir is round and will appear as a small, raised area under your skin. The reservoir is the part where a needle is inserted to give medicines or draw blood.  Catheter. The catheter is a thin, flexible tube that extends from the reservoir. The catheter is placed into a large vein. Medicine that is inserted into the reservoir goes into the catheter and then into the vein.  How will I care for my incision site? Do not get the incision site wet. Bathe or shower as directed by your health care provider. How is my port accessed? Special steps must be taken to access the port:  Before the port is accessed, a numbing cream can be placed on the skin. This helps numb the skin over the port site.  Your health care provider uses a sterile technique to access the port. ? Your health care provider must put on a mask and sterile gloves. ? The skin over your port is cleaned carefully with an antiseptic and allowed to dry. ? The port is gently pinched between sterile gloves, and a needle is inserted into the port.  Only "non-coring" port needles should be used to access the port. Once the port is accessed, a blood return should be checked. This helps ensure that the port  is in the vein and is not clogged.  If your port needs to remain accessed for a constant infusion, a clear (transparent) bandage will be placed over the needle site. The bandage and needle will need to be changed every week, or as directed by your health care provider.  Keep the bandage covering the needle clean and dry. Do not get it wet. Follow your health care provider's instructions on how to take a shower or bath while the port is accessed.  If your port does not need to stay accessed, no bandage is needed over the port.  What is flushing? Flushing helps keep the port from getting clogged. Follow your health care provider's instructions on how and when to flush the port. Ports are usually flushed with saline solution or a medicine called heparin. The need for flushing will depend on how the port is used.  If the port is used for intermittent medicines or blood draws, the port will need to be flushed: ? After medicines have been given. ? After blood has been drawn. ? As part of routine maintenance.  If a constant infusion is running, the port may not need to be flushed.  How long will my port stay implanted? The port can stay in for as long as your health care provider thinks it is needed. When it is time for the port to come out, surgery will be   done to remove it. The procedure is similar to the one performed when the port was put in. When should I seek immediate medical care? When you have an implanted port, you should seek immediate medical care if:  You notice a bad smell coming from the incision site.  You have swelling, redness, or drainage at the incision site.  You have more swelling or pain at the port site or the surrounding area.  You have a fever that is not controlled with medicine.  This information is not intended to replace advice given to you by your health care provider. Make sure you discuss any questions you have with your health care provider. Document  Released: 08/01/2005 Document Revised: 01/07/2016 Document Reviewed: 04/08/2013 Elsevier Interactive Patient Education  2017 Elsevier Inc.  

## 2018-07-17 ENCOUNTER — Encounter: Payer: Self-pay | Admitting: Radiation Oncology

## 2018-07-17 ENCOUNTER — Telehealth: Payer: Self-pay | Admitting: Hematology

## 2018-07-17 ENCOUNTER — Encounter: Payer: Self-pay | Admitting: *Deleted

## 2018-07-17 ENCOUNTER — Inpatient Hospital Stay: Payer: PRIVATE HEALTH INSURANCE

## 2018-07-17 ENCOUNTER — Inpatient Hospital Stay (HOSPITAL_BASED_OUTPATIENT_CLINIC_OR_DEPARTMENT_OTHER): Payer: PRIVATE HEALTH INSURANCE | Admitting: Hematology

## 2018-07-17 ENCOUNTER — Ambulatory Visit
Admission: RE | Admit: 2018-07-17 | Discharge: 2018-07-17 | Disposition: A | Discharge status: Home / Self Care | Payer: PRIVATE HEALTH INSURANCE | Source: Ambulatory Visit | Attending: Radiation Oncology | Admitting: Radiation Oncology

## 2018-07-17 ENCOUNTER — Other Ambulatory Visit: Payer: Self-pay

## 2018-07-17 VITALS — BP 138/85 | HR 60 | Temp 97.9°F | Resp 18 | Ht 74.0 in | Wt 229.7 lb

## 2018-07-17 DIAGNOSIS — C090 Malignant neoplasm of tonsillar fossa: Secondary | ICD-10-CM

## 2018-07-17 DIAGNOSIS — R918 Other nonspecific abnormal finding of lung field: Secondary | ICD-10-CM

## 2018-07-17 DIAGNOSIS — K5900 Constipation, unspecified: Secondary | ICD-10-CM | POA: Diagnosis not present

## 2018-07-17 DIAGNOSIS — J984 Other disorders of lung: Secondary | ICD-10-CM

## 2018-07-17 DIAGNOSIS — Z923 Personal history of irradiation: Secondary | ICD-10-CM

## 2018-07-17 DIAGNOSIS — Z79899 Other long term (current) drug therapy: Secondary | ICD-10-CM

## 2018-07-17 HISTORY — DX: Personal history of irradiation: Z92.3

## 2018-07-17 NOTE — Progress Notes (Addendum)
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  Pain issues, if any: He reports some burning with certain beverages and foods.  Using a feeding tube?: He still has a feeding tube in place. He does not use the feeding tube anymore.  Weight changes, if any:  Wt Readings from Last 3 Encounters:  07/17/18 229 lb 3.2 oz (104 kg)  05/24/18 239 lb 9.6 oz (108.7 kg)  05/21/18 239 lb 9.6 oz (108.7 kg)   Swallowing issues, if any: He needs to use liquid to help food go down his throat. He does have a decreased appetite and needs to be encouraged to eat. He states that he is not doing his swallowing exercises.  Smoking or chewing tobacco? No.  Using fluoride trays daily? No, he is not using them.   Last ENT visit was on: Not since diagnosis.   Other notable issues, if any: He last saw Dr. Irene Limbo on 05/24/18 and will see him today also.  He reports long history of upper respiratory congestion and coughing.   ALLERGIES:  has No Known Allergies.  Meds: Current Outpatient Medications  Medication Sig Dispense Refill  . lidocaine-prilocaine (EMLA) cream Apply to affected area once 30  g 3  . benzonatate (TESSALON) 100 MG capsule Take 1 capsule (100 mg total) by mouth 3 (three) times daily as needed for cough. (Patient not taking: Reported on 04/18/2018) 20 capsule 0  . clotrimazole (LOTRIMIN AF) 1 % cream Apply 1 application topically 2 (two) times daily. To rash on rt leg (Patient not taking: Reported on 05/24/2018) 60 g 0  . enalapril-hydrochlorothiazide (VASERETIC) 10-25 MG tablet Take 1 tablet by mouth daily.     Marland Kitchen lidocaine (XYLOCAINE) 2 % solution Patient: Mix 1part 2% viscous lidocaine, 1part H20. Swish & swallow 23mL of diluted mixture, 40min before meals and at bedtime, up to QID (Patient not taking: Reported on 04/18/2018) 100 mL 5  . LORazepam (LORAZEPAM INTENSOL) 2 MG/ML concentrated solution Take 0.5 mLs (1 mg total) by mouth as directed. 30 mins prior to radiation (Patient not taking: Reported on 04/18/2018) 15 mL 0  . metoCLOPramide (REGLAN) 5 MG/5ML solution Place 5 mLs (5 mg total) into feeding tube 3 (three) times daily with meals as needed for nausea or vomiting. Instead of Compazine. (Patient not taking: Reported on 04/18/2018) 120 mL 0  . Nutritional Supplements (FEEDING SUPPLEMENT, OSMOLITE 1.5 CAL,) LIQD Begin 1 carton Osmolite 1.5 via PEG QID with 120 mL free water before and after. Increase to 1 1/2 cartons on Thursday as tolerated. Increase to 2 cartons bid and 1 1/2 cartons bid on Saturday. Flush  tube with additional 240 mL free water TID between feedings. (Patient not taking: Reported on 07/17/2018) 7 Bottle 0  . ondansetron (ZOFRAN) 8 MG tablet Take 1 tablet (8 mg total) by mouth 2 (two) times daily as needed. Start on the third day after chemotherapy. (Patient not taking: Reported on 04/18/2018) 30 tablet 1  . oxyCODONE (ROXICODONE INTENSOL) 20 MG/ML concentrated solution Take 0.5 mLs (10 mg total) by mouth every 4 (four) hours as needed for severe pain or breakthrough pain. (Patient not taking: Reported on 04/18/2018) 30 mL 0  . potassium chloride (KLOR-CON) 20 MEQ  packet Place 20 mEq into feeding tube daily. Mixed in 128ml of water (Patient not taking: Reported on 04/18/2018) 10 packet 0  . senna-docusate (SENNA S) 8.6-50 MG tablet Take 2 tablets by mouth at bedtime as needed for mild constipation or moderate constipation. (Patient not taking: Reported on 07/17/2018) 60 tablet 1  . sodium fluoride (FLUORISHIELD) 1.1 % GEL dental gel Instill one drop of gel per tooth space of fluoride tray. Place over teeth for 5 minutes. Remove. Spit out excess. Repeat nightly. (Patient not taking: Reported on 07/17/2018) 120 mL prn   No current facility-administered medications for this encounter.     Physical Findings: Wt Readings from Last 3 Encounters:  07/17/18 229 lb 3.2 oz (104 kg)  05/24/18 239 lb 9.6 oz (108.7 kg)  05/21/18 239 lb 9.6 oz (108.7 kg)    height is 6\' 2"  (1.88 m) and weight is 229 lb 3.2 oz (104 kg). His oral temperature is 98.3 F (36.8 C). His blood pressure is 135/75 and his pulse is 66. His respiration is 20 and oxygen saturation is 100%.  General: Alert and oriented, in no acute distress. HEENT: Head is normocephalic. Oropharynx is clear. Neck: Neck is supple, no palpable cervical or supraclavicular lymphadenopathy. Heart: Regular in rate and rhythm with no murmurs, rubs, or gallops. Chest: Clear to auscultation bilaterally, with no rhonchi, wheezes, or rales. Lymphatics: see Neck Exam Skin: No concerning lesions. Neurologic: Speech is fluent. Coordination is intact. Psychiatric: Judgment and insight are intact. Affect is appropriate.   Lab Findings: Lab Results  Component Value Date   WBC 3.4 (L) 07/16/2018   HGB 13.1 07/16/2018   HCT 40.5 07/16/2018   MCV 87.9 07/16/2018   PLT 148 (L) 07/16/2018    Lab Results  Component Value Date   TSH 2.776 07/16/2018    Radiographic Findings: Nm Pet Image Restag (ps) Skull Base To Thigh  Result Date: 07/16/2018 CLINICAL DATA:  Subsequent treatment strategy for tonsillar cancer. EXAM:  NUCLEAR MEDICINE PET SKULL BASE TO THIGH TECHNIQUE: 11.5 mCi F-18 FDG was injected intravenously. Full-ring PET imaging was performed from the skull base to thigh after the radiotracer. CT data was obtained and used for attenuation correction and anatomic localization. Fasting blood glucose: 86 mg/dl COMPARISON:  01/04/2018.  Neck CT 12/20/2017. FINDINGS: Mediastinal blood pool activity: SUV max 2.6 NECK: Mild focal hypermetabolism is seen anterior to the left aspect of the hyoid bone (SUV max 3.9) without a definite CT correlate. No hypermetabolic lymph nodes. Incidental CT findings: None. CHEST: No hypermetabolic mediastinal, hilar or axillary lymph nodes. New rounded consolidation in the medial left lower lobe has an SUV max of 4.7. Incidental CT findings: Right IJ Port-A-Cath terminates in the high right atrium. Coronary artery calcification. No pericardial or pleural effusion. 4 mm nodule along the right major fissure is too small for PET resolution. ABDOMEN/PELVIS: No abnormal hypermetabolism in the liver, adrenal glands,  spleen or pancreas. No hypermetabolic lymph nodes. Incidental CT findings: Low-attenuation lesions in the liver are difficult to characterize due to size and lack of postcontrast imaging. Gallbladder, adrenal glands and right kidney are unremarkable. Tiny stone in the left kidney. Spleen and pancreas are grossly unremarkable. Percutaneous gastrostomy tube. Duodenal diverticulum. SKELETON: No abnormal osseous hypermetabolism. Incidental CT findings: None. IMPRESSION: 1. New rounded consolidation in the medial left lower lobe shows abnormal hypermetabolism and may be infectious or inflammatory in etiology. Malignancy cannot be excluded. Consider follow-up CT chest without contrast in 3-4 weeks in further initial evaluation, as clinically indicated. 2. Mild focal hypermetabolism anterior to the left aspect of the hyoid bone, without a definite CT correlate. 3. Tiny left renal stone.  Electronically Signed   By: Lorin Picket M.D.   On: 07/16/2018 11:05    Impression/Plan:    1) Head and Neck Cancer Status: complete response to ChRT, will need to rescan lung.  On recent PET, there is a new rounded consolidation in the medial left lower lobe with abnormal hypermetabolism that may be infectious, inflammatory, or malignant. I have personally reviewed his images and I am hopeful it is not malignant. Will obtain a chest CT in 3-4 weeks for further evaluation. [name], RN, will schedule to the patient to see Dr. Lucia Gaskins in 3 months.  2) Nutritional Status: Stabilizing. PEG tube: Still in place. Patient no longer uses it. Advised patient to speak with Dr. Irene Limbo today about getting it removed.  3) Risk Factors: The patient has been educated about risk factors including alcohol and tobacco abuse; they understand that avoidance of alcohol and tobacco is important to prevent recurrences as well as other cancers  4) Swallowing: Continued issues. Advised him to do his swallowing exercises.  5) Dental: Encouraged to continue regular followup with dentistry, and dental hygiene including fluoride rinses. Use Biotene rinses for dry mouth.  6) Thyroid function: WNL. Check annually with Dr Irene Limbo. He knows to call if he begins to experience severe fatigue or increased sensitivity to cold. Lab Results  Component Value Date   TSH 2.776 07/16/2018   7) Other: Advised patient to use vitamin E lotion/oil for dry skin.  8) Will obtain chest CT in 3-4 weeks. I will call the patient with these results.  He will followup long term with med onc and ENT.  Patient was given an informational document that our care team curated for head and neck cancer survivors to help with symptom management,and overall post-treatment wellness. Phone number of [name], RN, our Head and Neck Oncology Navigator was provided in case any questions arise.   I spend over 20 minutes face to face with patient, over 50%  of that time on counseling and care coordination. _____________________________________   Eppie Gibson, MD  This document serves as a record of services personally performed by Eppie Gibson, MD. It was created on her behalf by Rae Lips, a trained medical scribe. The creation of this record is based on the scribe's personal observations and the provider's statements to them. This document has been checked and approved by the attending provider.

## 2018-07-17 NOTE — Telephone Encounter (Signed)
Scheduled appt per 12/3 los - gave patient AVS and calender per los.  - central radiology to contact patient with port removal

## 2018-07-18 ENCOUNTER — Encounter: Payer: Self-pay | Admitting: Radiation Oncology

## 2018-07-18 ENCOUNTER — Other Ambulatory Visit: Payer: Self-pay | Admitting: Radiation Oncology

## 2018-07-18 DIAGNOSIS — R911 Solitary pulmonary nodule: Secondary | ICD-10-CM

## 2018-07-19 ENCOUNTER — Telehealth: Payer: Self-pay | Admitting: *Deleted

## 2018-07-19 ENCOUNTER — Ambulatory Visit: Payer: Self-pay | Admitting: Hematology

## 2018-07-19 ENCOUNTER — Other Ambulatory Visit: Payer: Self-pay

## 2018-07-19 ENCOUNTER — Encounter: Payer: Self-pay | Admitting: *Deleted

## 2018-07-19 NOTE — Progress Notes (Signed)
A user error has taken place: encounter opened in error, closed for administrative reasons.

## 2018-07-19 NOTE — Telephone Encounter (Signed)
Oncology Nurse Navigator Documentation  Per patient's 07/17/18 post-treatment follow-up with Dr. Isidore Moos, called ENT Dr. Pollie Friar office to coordinate appointment.  Spoke with Olin Hauser, requested patient be contacted and scheduled for routine follow-up in 3 months.  She verbalized understanding.  [name], RN, BSN Head & Neck Oncology Nurse [place] [facility] [place] [phone]

## 2018-07-19 NOTE — Telephone Encounter (Signed)
CALLED PATIENT TO INFORM OF CT FOR 08-09-18 - ARRIVAL TIME - 10:45 AM @ [facility], NO RESTRICTIONS, SPOKE WITH PATIENT AND HE IS AWARE OF THIS TEST

## 2018-07-19 NOTE — Progress Notes (Signed)
Oncology Nurse Navigator Documentation  Met with Travis Mcdonald and his wife during 44-monthpost-tmt follow-up with Dr. SIsidore Mooswhich included discussion of 12/2 re-staging PET.  They voiced understanding of NED in treated area, recommendation for CT Chest later this month for surveillance medial L lobe consolidation.  He voiced understanding of Dr. SPearlie Oysterrecommendation to return to primary care dentist for regular cleaning 3-4/yr, he noted he has appt later this month.  He reported moderate compliance with SLP HEP.  He was re-educated on importance of conducting these exercises to optimize post-tmt swallowing function, was encouraged by Dr. SIsidore Moosand this navigator to conduct BID per guidance of SLP CGarald Balding  He voiced understanding I will facilitate his return to ENT NLucia Gaskinsin 3 months for routine post-tmt follow-up. I encourage them to call me with questions/concerns.  RGayleen Orem RN, BSN Head & Neck Oncology Nurse [phone]

## 2018-07-26 ENCOUNTER — Other Ambulatory Visit: Payer: Self-pay | Admitting: Radiology

## 2018-07-27 ENCOUNTER — Other Ambulatory Visit: Payer: Self-pay

## 2018-07-27 ENCOUNTER — Ambulatory Visit (HOSPITAL_COMMUNITY)
Admission: RE | Admit: 2018-07-27 | Discharge: 2018-07-27 | Disposition: A | Discharge status: Home / Self Care | Payer: No Typology Code available for payment source | Source: Ambulatory Visit | Attending: Hematology | Admitting: Hematology

## 2018-07-27 ENCOUNTER — Encounter (HOSPITAL_COMMUNITY): Payer: Self-pay

## 2018-07-27 DIAGNOSIS — Z923 Personal history of irradiation: Secondary | ICD-10-CM | POA: Diagnosis not present

## 2018-07-27 DIAGNOSIS — J984 Other disorders of lung: Secondary | ICD-10-CM

## 2018-07-27 DIAGNOSIS — Z431 Encounter for attention to gastrostomy: Secondary | ICD-10-CM | POA: Insufficient documentation

## 2018-07-27 DIAGNOSIS — Z452 Encounter for adjustment and management of vascular access device: Secondary | ICD-10-CM | POA: Insufficient documentation

## 2018-07-27 DIAGNOSIS — Z85818 Personal history of malignant neoplasm of other sites of lip, oral cavity, and pharynx: Secondary | ICD-10-CM | POA: Diagnosis not present

## 2018-07-27 DIAGNOSIS — C090 Malignant neoplasm of tonsillar fossa: Secondary | ICD-10-CM

## 2018-07-27 DIAGNOSIS — I10 Essential (primary) hypertension: Secondary | ICD-10-CM | POA: Diagnosis not present

## 2018-07-27 HISTORY — PX: IR REMOVAL TUN ACCESS W/ PORT W/O FL MOD SED: IMG2290

## 2018-07-27 HISTORY — PX: IR GASTROSTOMY TUBE REMOVAL: IMG5492

## 2018-07-27 LAB — CBC
HCT: 42.1 % (ref 39.0–52.0)
Hemoglobin: 13.7 g/dL (ref 13.0–17.0)
MCH: 29.8 pg (ref 26.0–34.0)
MCHC: 32.5 g/dL (ref 30.0–36.0)
MCV: 91.5 fL (ref 80.0–100.0)
NRBC: 0 % (ref 0.0–0.2)
Platelets: 160 10*3/uL (ref 150–400)
RBC: 4.6 MIL/uL (ref 4.22–5.81)
RDW: 13.1 % (ref 11.5–15.5)
WBC: 4 10*3/uL (ref 4.0–10.5)

## 2018-07-27 LAB — PROTIME-INR
INR: 0.99
Prothrombin Time: 13 seconds (ref 11.4–15.2)

## 2018-07-27 MED ORDER — FENTANYL CITRATE (PF) 100 MCG/2ML IJ SOLN
INTRAMUSCULAR | Status: AC | PRN
  Administered 2018-07-27 (×2): 50 ug via INTRAVENOUS
  Administered 2018-07-27: 25 ug via INTRAVENOUS
  Administered 2018-07-27: 50 ug via INTRAVENOUS

## 2018-07-27 MED ORDER — MIDAZOLAM HCL 2 MG/2ML IJ SOLN
INTRAMUSCULAR | Status: AC | PRN
  Administered 2018-07-27 (×2): 1 mg via INTRAVENOUS
  Administered 2018-07-27: 0.5 mg via INTRAVENOUS
  Administered 2018-07-27: 1 mg via INTRAVENOUS

## 2018-07-27 MED ORDER — MIDAZOLAM HCL 2 MG/2ML IJ SOLN
INTRAMUSCULAR | Status: AC
  Filled 2018-07-27: qty 6

## 2018-07-27 MED ORDER — NALOXONE HCL 0.4 MG/ML IJ SOLN
INTRAMUSCULAR | Status: AC
  Filled 2018-07-27: qty 1

## 2018-07-27 MED ORDER — FENTANYL CITRATE (PF) 100 MCG/2ML IJ SOLN
INTRAMUSCULAR | Status: AC
  Filled 2018-07-27: qty 6

## 2018-07-27 MED ORDER — LIDOCAINE VISCOUS HCL 2 % MT SOLN
OROMUCOSAL | Status: AC
  Administered 2018-07-27: 7 mL via GASTROSTOMY
  Filled 2018-07-27: qty 15

## 2018-07-27 MED ORDER — CEFAZOLIN SODIUM-DEXTROSE 2-4 GM/100ML-% IV SOLN
INTRAVENOUS | Status: AC
  Filled 2018-07-27: qty 100

## 2018-07-27 MED ORDER — LIDOCAINE-EPINEPHRINE (PF) 2 %-1:200000 IJ SOLN
INTRAMUSCULAR | Status: AC
  Filled 2018-07-27: qty 10

## 2018-07-27 MED ORDER — HYDROCODONE-ACETAMINOPHEN 5-325 MG PO TABS: 1.0000 | ORAL_TABLET | ORAL | Status: DC | PRN

## 2018-07-27 MED ORDER — SODIUM CHLORIDE 0.9 % IV SOLN
INTRAVENOUS | Status: DC
  Administered 2018-07-27: 10:00:00 via INTRAVENOUS

## 2018-07-27 MED ORDER — LIDOCAINE VISCOUS HCL 2 % MT SOLN
OROMUCOSAL | Status: AC
  Filled 2018-07-27: qty 15

## 2018-07-27 MED ORDER — FLUMAZENIL 0.5 MG/5ML IV SOLN
INTRAVENOUS | Status: AC
  Filled 2018-07-27: qty 5

## 2018-07-27 MED ORDER — CEFAZOLIN SODIUM-DEXTROSE 2-4 GM/100ML-% IV SOLN
2.0000 g | Freq: Once | INTRAVENOUS | Status: AC
  Administered 2018-07-27: 2 g via INTRAVENOUS

## 2018-07-27 NOTE — Procedures (Signed)
Interventional Radiology Procedure Note  Procedure: Successful explantation of portacatheter and gastrostomy tube.   Complications: None  Estimated Blood Loss: None  Recommendations: - Bedrest x 1 hr - DC home   Signed,  Criselda Peaches, MD

## 2018-07-27 NOTE — Discharge Instructions (Signed)
Moderate Conscious Sedation, Adult, Care After These instructions provide you with information about caring for yourself after your procedure. Your health care provider may also give you more specific instructions. Your treatment has been planned according to current medical practices, but problems sometimes occur. Call your health care provider if you have any problems or questions after your procedure. What can I expect after the procedure? After your procedure, it is common:  To feel sleepy for several hours.  To feel clumsy and have poor balance for several hours.  To have poor judgment for several hours.  To vomit if you eat too soon.  Follow these instructions at home: For at least 24 hours after the procedure:   Do not: ? Participate in activities where you could fall or become injured. ? Drive. ? Use heavy machinery. ? Drink alcohol. ? Take sleeping pills or medicines that cause drowsiness. ? Make important decisions or sign legal documents. ? Take care of children on your own.  Rest. Eating and drinking  Follow the diet recommended by your health care provider.  If you vomit: ? Drink water, juice, or soup when you can drink without vomiting. ? Make sure you have little or no nausea before eating solid foods. General instructions  Have a responsible adult stay with you until you are awake and alert.  Take over-the-counter and prescription medicines only as told by your health care provider.  If you smoke, do not smoke without supervision.  Keep all follow-up visits as told by your health care provider. This is important. Contact a health care provider if:  You keep feeling nauseous or you keep vomiting.  You feel light-headed.  You develop a rash.  You have a fever. Get help right away if:  You have trouble breathing. This information is not intended to replace advice given to you by your health care provider. Make sure you discuss any questions you have  with your health care provider. Document Released: 05/22/2013 Document Revised: 01/04/2016 Document Reviewed: 11/21/2015 Elsevier Interactive Patient Education  2018 [place], Adult An incision is a surgical cut that is made through your skin. Most incisions are closed after surgery. Your incision may be closed with stitches (sutures), staples, skin glue, or adhesive strips. You may need to return to your health care provider to have sutures or staples removed. This may occur several days to several weeks after your surgery. The incision needs to be cared for properly to prevent infection. How to care for your incision Incision care   Follow instructions from your health care provider about how to take care of your incision. Make sure you: ? Wash your hands with soap and water before you change the bandage (dressing). If soap and water are not available, use hand sanitizer. ? Change your dressing as told by your health care provider. May remove dressing and shower in 24 to 48 hours.  Keep sites clean and dry and replace bandaids as necessary.  ? Leave sutures, skin glue, or adhesive strips in place. These skin closures may need to stay in place for 2 weeks or longer. If adhesive strip edges start to loosen and curl up, you may trim the loose edges. Do not remove adhesive strips completely unless your health care provider tells you to do that.  Check your incision area every day for signs of infection. Check for: ? More redness, swelling, or pain. ? More fluid or blood. ? Warmth. ? Pus or a bad  smell.  Ask your health care provider how to clean the incision. This may include: ? Using mild soap and water. ? Using a clean towel to pat the incision dry after cleaning it. ? Applying a cream or ointment. Do this only as told by your health care provider. ? Covering the incision with a clean dressing.  Ask your health care provider when you can leave the incision  uncovered.  Do not take baths, swim, or use a hot tub until your health care provider approves. Ask your health care provider if you can take showers. You may only be allowed to take sponge baths for bathing. Medicines  If you were prescribed an antibiotic medicine, cream, or ointment, take or apply the antibiotic as told by your health care provider. Do not stop taking or applying the antibiotic even if your condition improves.  Take over-the-counter and prescription medicines only as told by your health care provider. General instructions  Limit movement around your incision to improve healing. ? Avoid straining, lifting, or exercise for the first month, or for as long as told by your health care provider. ? Follow instructions from your health care provider about returning to your normal activities. ? Ask your health care provider what activities are safe.  Protect your incision from the sun when you are outside for the first 6 months, or for as long as told by your health care provider. Apply sunscreen around the scar or cover it up.  Keep all follow-up visits as told by your health care provider. This is important. Contact a health care provider if:  Your have more redness, swelling, or pain around the incision.  You have more fluid or blood coming from the incision.  Your incision feels warm to the touch.  You have pus or a bad smell coming from the incision.  You have a fever or shaking chills.  You are nauseous or you vomit.  You are dizzy.  Your sutures or staples come undone. Get help right away if:  You have a red streak coming from your incision.  Your incision bleeds through the dressing and the bleeding does not stop with gentle pressure.  The edges of your incision open up and separate.  You have severe pain.  You have a rash.  You are confused.  You faint.  You have trouble breathing and a fast heartbeat. This information is not intended to replace  advice given to you by your health care provider. Make sure you discuss any questions you have with your health care provider. Document Released: 02/18/2005 Document Revised: 04/08/2016 Document Reviewed: 02/17/2016 Elsevier Interactive Patient Education  [employer].

## 2018-07-27 NOTE — H&P (Signed)
Referring Physician(s): Brunetta Genera  Supervising Physician: Jacqulynn Cadet  Patient Status:  [facility]  Chief Complaint:  "I'm getting my port and stomach tube out"  Subjective: Patient familiar to IR service from prior Port-A-Cath and gastrostomy tube placements on 02/02/2018.  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  He has completed all treatment and is currently eating.  He presents today for both Port-A-Cath and gastrostomy tube removals.  He currently denies fever, headache, chest pain, dyspnea, cough, abdominal/back pain, nausea, vomiting or bleeding.  Past Medical History:  Diagnosis Date  . History of radiation therapy 02/05/18- 03/28/18   Left tonsil and neck bilaterally, 2 Gy X 35 fractions for a total dose of 70 Gy.   Marland Kitchen Hypertension    Past Surgical History:  Procedure Laterality Date  . [place]   ruptured lower back disc surgery. L4 & L5 Lumber disc  . IR GASTROSTOMY TUBE MOD SED  02/02/2018  . IR IMAGING GUIDED PORT INSERTION  02/02/2018      Allergies: Patient has no known allergies.  Medications: Prior to Admission medications   Medication Sig Start Date End Date Taking? Authorizing Provider  benzonatate (TESSALON) 100 MG capsule Take 1 capsule (100 mg total) by mouth 3 (three) times daily as needed for cough. Patient not taking: Reported on 04/18/2018 03/26/18   Brunetta Genera, MD  clotrimazole (LOTRIMIN AF) 1 % cream Apply 1 application topically 2 (two) times daily. To rash on rt leg Patient not taking: Reported on 05/24/2018 01/24/18   Brunetta Genera, MD  enalapril-hydrochlorothiazide (VASERETIC) 10-25 MG tablet Take 1 tablet by mouth daily.  09/25/15   [provider]  lidocaine (XYLOCAINE) 2 % solution Patient: Mix 1part 2% viscous lidocaine, 1part H20. Swish & swallow 40mL of diluted mixture, 1min before meals and at bedtime, up to QID Patient not taking: Reported on  04/18/2018 02/12/18   Eppie Gibson, MD  lidocaine-prilocaine (EMLA) cream Apply to affected area once Patient not taking: Reported on 07/17/2018 01/29/18   Brunetta Genera, MD  LORazepam (LORAZEPAM INTENSOL) 2 MG/ML concentrated solution Take 0.5 mLs (1 mg total) by mouth as directed. 30 mins prior to radiation Patient not taking: Reported on 04/18/2018 03/22/18   Brunetta Genera, MD  metoCLOPramide (REGLAN) 5 MG/5ML solution Place 5 mLs (5 mg total) into feeding tube 3 (three) times daily with meals as needed for nausea or vomiting. Instead of Compazine. Patient not taking: Reported on 04/18/2018 03/23/18   Brunetta Genera, MD  Nutritional Supplements (FEEDING SUPPLEMENT, OSMOLITE 1.5 CAL,) LIQD Begin 1 carton Osmolite 1.5 via PEG QID with 120 mL free water before and after. Increase to 1 1/2 cartons on Thursday as tolerated. Increase to 2 cartons bid and 1 1/2 cartons bid on Saturday. Flush tube with additional 240 mL free water TID between feedings. Patient not taking: Reported on 07/17/2018 03/06/18   Eppie Gibson, MD  ondansetron (ZOFRAN) 8 MG tablet Take 1 tablet (8 mg total) by mouth 2 (two) times daily as needed. Start on the third day after chemotherapy. Patient not taking: Reported on 04/18/2018 01/29/18   Brunetta Genera, MD  oxyCODONE (ROXICODONE INTENSOL) 20 MG/ML concentrated solution Take 0.5 mLs (10 mg total) by mouth every 4 (four) hours as needed for severe pain or breakthrough pain. Patient not taking: Reported on 04/18/2018 03/28/18   Brunetta Genera, MD  potassium chloride (KLOR-CON) 20 MEQ packet Place 20  mEq into feeding tube daily. Mixed in 166ml of water Patient not taking: Reported on 04/18/2018 04/06/18   Brunetta Genera, MD  senna-docusate (SENNA S) 8.6-50 MG tablet Take 2 tablets by mouth at bedtime as needed for mild constipation or moderate constipation. Patient not taking: Reported on 07/17/2018 03/12/18   Brunetta Genera, MD  sodium fluoride (FLUORISHIELD)  1.1 % GEL dental gel Instill one drop of gel per tooth space of fluoride tray. Place over teeth for 5 minutes. Remove. Spit out excess. Repeat nightly. Patient not taking: Reported on 07/17/2018 01/15/18   Lenn Cal, DDS     Vital Signs: Blood pressure 150/86, temperature 98.5, heart rate 73, respirations 18, O2 sat 100% room air   Physical Exam awake, alert.  Chest clear to auscultation bilaterally.  Clean, intact right chest wall Port-A-Cath.  Heart with regular rate and rhythm.  Abdomen soft, positive bowel sounds, intact G-tube.  No lower extremity edema  Imaging: No results found.  Labs:  CBC: Recent Labs    04/26/18 0814 05/24/18 1320 07/16/18 0847 07/27/18 0955  WBC 3.9* 3.7* 3.4* 4.0  HGB 10.9* 12.5* 13.1 13.7  HCT 32.6* 37.1* 40.5 42.1  PLT 171 180 148* 160    COAGS: Recent Labs    02/02/18 0800 07/27/18 0955  INR 1.01 0.99    BMP: Recent Labs    04/06/18 1139 04/26/18 0814 05/24/18 1320 07/16/18 0847  NA 139 140 142 143  K 3.2* 3.6 3.7 3.8  CL 94* 102 104 108  CO2 35* 30 28 25   GLUCOSE 92 128* 105* 101*  BUN 23* 13 11 9   CALCIUM 9.6 9.5 9.5 9.5  CREATININE 1.16 0.83 0.79 0.82  GFRNONAA >60 >60 >60 >60  GFRAA >60 >60 >60 >60    LIVER FUNCTION TESTS: Recent Labs    04/06/18 1139 04/26/18 0814 05/24/18 1320 07/16/18 0847  BILITOT 0.6 0.4 0.4 0.4  AST 28 13* 13* 11*  ALT 201* 15 15 8   ALKPHOS 86 68 77 63  PROT 7.0 6.1* 6.6 6.6  ALBUMIN 3.3* 3.2* 3.7 3.7    Assessment and Plan: Pt with 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  Status post Port-A-Cath and gastrostomy tube placement on 02/02/2018.  He has completed all treatment and is currently eating.  He presents today for both Port-A-Cath and gastrostomy tube removals.  Details/risks of procedures, including but not limited to, internal bleeding, infection, injury to adjacent structures discussed with patient and spouse with their understanding and  consent.   Electronically Signed: D. Rowe Robert, PA-C 07/27/2018, 11:08 AM   I spent a total of 20 minutes at the the patient's bedside AND on the patient's hospital floor or unit, greater than 50% of which was counseling/coordinating care for Port-A-Cath and gastrostomy tube removals

## 2018-07-27 NOTE — Discharge Instructions (Signed)
Moderate Conscious Sedation, Adult, Care After These instructions provide you with information about caring for yourself after your procedure. Your health care provider may also give you more specific instructions. Your treatment has been planned according to current medical practices, but problems sometimes occur. Call your health care provider if you have any problems or questions after your procedure. What can I expect after the procedure? After your procedure, it is common:  To feel sleepy for several hours.  To feel clumsy and have poor balance for several hours.  To have poor judgment for several hours.  To vomit if you eat too soon.  Follow these instructions at home: For at least 24 hours after the procedure:  Incision Care, Adult An incision is a surgical cut that is made through your skin. Most incisions are closed after surgery. Your incision may be closed with stitches (sutures), staples, skin glue, or adhesive strips. You may need to return to your health care provider to have sutures or staples removed. This may occur several days to several weeks after your surgery. The incision needs to be cared for properly to prevent infection. How to care for your incision Incision care  Follow instructions from your health care provider about how to take care of your incision. Make sure you: Wash your hands with soap and water before you change the bandage (dressing). If soap and water are not available, use hand sanitizer. Change your dressing as told by your health care provider. May remove dressing and shower or bathe in 24 to 48 hours.  Keep sites clean and dry and replace dressings with bandaids as necessary. Leave sutures, skin glue, or adhesive strips in place. These skin closures may need to stay in place for 2 weeks or longer. If adhesive strip edges start to loosen and curl up, you may trim the loose edges. Do not remove adhesive strips completely unless your health care provider  tells you to do that. Check your incision area every day for signs of infection. Check for: More redness, swelling, or pain. More fluid or blood. Warmth. Pus or a bad smell. Ask your health care provider how to clean the incision. This may include: Using mild soap and water. Using a clean towel to pat the incision dry after cleaning it. Applying a cream or ointment. Do this only as told by your health care provider. Covering the incision with a clean dressing. Ask your health care provider when you can leave the incision uncovered. Do not take baths, swim, or use a hot tub until your health care provider approves. Ask your health care provider if you can take showers. You may only be allowed to take sponge baths for bathing. Medicines If you were prescribed an antibiotic medicine, cream, or ointment, take or apply the antibiotic as told by your health care provider. Do not stop taking or applying the antibiotic even if your condition improves. Take over-the-counter and prescription medicines only as told by your health care provider. General instructions Limit movement around your incision to improve healing. Avoid straining, lifting, or exercise for the first month, or for as long as told by your health care provider. Follow instructions from your health care provider about returning to your normal activities. Ask your health care provider what activities are safe. Protect your incision from the sun when you are outside for the first 6 months, or for as long as told by your health care provider. Apply sunscreen around the scar or cover it up. Keep  all follow-up visits as told by your health care provider. This is important. Contact a health care provider if: Your have more redness, swelling, or pain around the incision. You have more fluid or blood coming from the incision. Your incision feels warm to the touch. You have pus or a bad smell coming from the incision. You have a fever or  shaking chills. You are nauseous or you vomit. You are dizzy. Your sutures or staples come undone. Get help right away if: You have a red streak coming from your incision. Your incision bleeds through the dressing and the bleeding does not stop with gentle pressure. The edges of your incision open up and separate. You have severe pain. You have a rash. You are confused. You faint. You have trouble breathing and a fast heartbeat. This information is not intended to replace advice given to you by your health care provider. Make sure you discuss any questions you have with your health care provider. Document Released: 02/18/2005 Document Revised: 04/08/2016 Document Reviewed: 02/17/2016 Elsevier Interactive Patient Education  2018 [place] not: ? Participate in activities where you could fall or become injured. ? Drive. ? Use heavy machinery. ? Drink alcohol. ? Take sleeping pills or medicines that cause drowsiness. ? Make important decisions or sign legal documents. ? Take care of children on your own.  Rest. Eating and drinking  Follow the diet recommended by your health care provider.  If you vomit: ? Drink water, juice, or soup when you can drink without vomiting. ? Make sure you have little or no nausea before eating solid foods. General instructions  Have a responsible adult stay with you until you are awake and alert.  Take over-the-counter and prescription medicines only as told by your health care provider.  If you smoke, do not smoke without supervision.  Keep all follow-up visits as told by your health care provider. This is important. Contact a health care provider if:  You keep feeling nauseous or you keep vomiting.  You feel light-headed.  You develop a rash.  You have a fever. Get help right away if:  You have trouble breathing. This information is not intended to replace advice given to you by your health care provider. Make sure you  discuss any questions you have with your health care provider. Document Released: 05/22/2013 Document Revised: 01/04/2016 Document Reviewed: 11/21/2015 Elsevier Interactive Patient Education  [employer].

## 2018-08-09 ENCOUNTER — Ambulatory Visit (HOSPITAL_COMMUNITY): Payer: No Typology Code available for payment source

## 2018-08-17 ENCOUNTER — Ambulatory Visit (HOSPITAL_COMMUNITY)
Admission: RE | Admit: 2018-08-17 | Discharge: 2018-08-17 | Disposition: A | Discharge status: Home / Self Care | Payer: No Typology Code available for payment source | Source: Ambulatory Visit | Attending: Radiation Oncology | Admitting: Radiation Oncology

## 2018-08-17 DIAGNOSIS — R911 Solitary pulmonary nodule: Secondary | ICD-10-CM | POA: Insufficient documentation

## 2018-08-20 ENCOUNTER — Encounter: Payer: Self-pay | Admitting: Radiation Oncology

## 2018-08-20 NOTE — Progress Notes (Signed)
I called the patient to let him know about the results from his chest CT scan.  The results are very reassuring.  I told him it is optional as to whether he wants to undergo another CT scan in a few months.  He can discuss this with Dr. Irene Limbo when he sees him in February and I will defer to him and Dr. Irene Limbo on ordering that if he chooses.  In my opinion the area in the lower left lung is not behaving like cancer and I do not think another chest CT is necessary.  Of course, he can undergo imaging in the future as clinically indicated.  I wished him the very best; he appreciated the call -----------------------------------  Eppie Gibson, MD

## 2018-09-14 NOTE — Progress Notes (Signed)
HEMATOLOGY/ONCOLOGY CLINIC NOTE  Date of Service: 09/17/2018  Patient Care Team: Celene Squibb, MD as PCP - General (Internal Medicine) Rozetta Nunnery, MD as Consulting Physician (Otolaryngology) Eppie Gibson, MD as Attending Physician (Radiation Oncology) Brunetta Genera, MD as Consulting Physician (Hematology) Leota Sauers, RN as Oncology Nurse Navigator Karie Mainland, RD as Dietitian (Nutrition) Valentino Saxon [place], CCC-SLP as Speech Language Pathologist (Speech Pathology) Wynelle Beckmann, Melodie Bouillon, PT as Physical Therapist (Physical Therapy) Kennith Center, LCSW as Social Worker  CHIEF COMPLAINTS/PURPOSE OF CONSULTATION:  F/u for Tonsillar carcinoma   HISTORY OF PRESENTING ILLNESS:   Travis Mcdonald is a wonderful 53 y.o. 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 He is accompanied today by his wife. The pt reports that he is doing well overall.   The pt reports first noticing an enlargement of his left neck around January 2019. He believed these to be sinus related since he took sinus medications which decreased the swelling. He notes aggravating pain but denies significant pain or problems swallowing. He reports left ear and jaw pain as well.   Four teeth extraction on 01/19/18 with radiation planning this afternoon and tentative radiation beginning 02/05/18.   He denies any ear infection concerns after his audiogram. He denies recurrent ear infections but notes that he had two ear infections last year.    The pt notes that he will try to continue work each day.  He adds that he has a skin rash on his right upper leg which is recurrent in the summers. He denies seeing a dermatologist in the past and treats the associated itching with cortisone and benadryl.   He notes that he does snore at night and his wife notes that he does not snore when he sleeps on his side.   Of note prior to the patient's visit  today, pt has had PET/CT completed on 01/04/18 with results revealing Extensive hypermetabolic activity in the left neck, index lesion maximum SUV 20.3. Looking for a primary lesion in the neck, there is some very faint asymmetry of the left palatine tonsillar pillar which could conceivably represent a small lesion. 2. No significant abnormal activity in the chest, abdomen/pelvis, or skeleton. 3. Other imaging findings of potential clinical significance: Aortic Atherosclerosis (ICD10-I70.0). Coronary atherosclerosis. Small mucous retention cyst in left maxillary sinus. Diffuse hepatic steatosis. Nonspecific but probably benign hypodense lesions in the lateral segment left hepatic lobe.   Most recent lab results (01/24/18) of CBC w/diff, and CMP  is as follows: all values are WNL except glucose at 162. Magnesium 01/24/18 is WNL at 1.8 Phosphorus 01/24/18 is WNL at 2.7  On review of systems, pt reports left ear pain, left neck discomfort, left jaw pain, upper right leg rash, and denies abdominal pains, other skin rashes, and any other symptoms.   On PMHx the pt reports 1998 and 1999 back surgeries for ruptured disks with microdiscectomy, high blood pressure, denies diabetes, heart, lung or kidney problems. 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 Denies ever smoking cigarettes. Denies ETOH consumption in 30 years. Reports infrequent marijuana smoking 30 years ago.  On Family Hx the pt reports maternal cousin with brain cancer, maternal cousin with pancreatic cancer, maternal uncle with cancer. Denies much paternal knowledge.   Interval History:   JEMAL MISKELL returns today regarding his tonsillar carcinoma. The patient's last visit with [place] was  on 07/17/18. The pt reports that he is doing well overall.   The pt reports that he is not eating as much as he used to prior to diagnosis, but he is now eating much better than previously in his care. He notes some foods  continue to burn his mouth more than others, but is holding his weight. He denies problems swallowing or any new symptoms with his mouth, throat, or neck. He notes that he has enjoyed good energy levels and is keeping active. He notes that his next visit with ENT Dr. Lucia Gaskins will be in March 2020. The pt is now not taking any medications, including his previous BP medications as his BP has normalized, and was 124/81 today in clinic.   Of note since the patient's last visit, pt has had CT Chest completed on 08/17/18 with results revealing Interval decrease in overall volume of subpleural consolidation within the medial left lower lobe. Findings are likely the sequelae of inflammatory or infectious process. Consider repeat CT of the chest in 3 months to ensure complete resolution. 2. Unchanged appearance of small subpleural nodules within the right lower lobe and left upper lobe. 3.  Aortic Atherosclerosis 4. Coronary artery atherosclerotic calcifications.  Lab results today (09/17/18) of CBC w/diff and CMP is as follows: all values are WNL except for Glucose at 105, AST at 8.  On review of systems, pt reports good energy levels, eating well, stable weight, and denies abdominal pains, difficulty swallowing, leg swelling, throat/neck/mouth symptoms, and any other symptoms.   MEDICAL HISTORY:  Past Medical History:  Diagnosis Date  . History of radiation therapy 02/05/18- 03/28/18   Left tonsil and neck bilaterally, 2 Gy X 35 fractions for a total dose of 70 Gy.   Marland Kitchen Hypertension     SURGICAL HISTORY: Past Surgical History:  Procedure Laterality Date  . [place]   ruptured lower back disc surgery. L4 & L5 Lumber disc  . IR GASTROSTOMY TUBE MOD SED  02/02/2018  . IR GASTROSTOMY TUBE REMOVAL  07/27/2018  . IR IMAGING GUIDED PORT INSERTION  02/02/2018  . IR REMOVAL TUN ACCESS W/ PORT W/O FL MOD SED  07/27/2018    SOCIAL HISTORY: Social History   Socioeconomic History  . Marital status:  Married    Spouse name: Not on file  . Number of children: Not on file  . Years of education: Not on file  . Highest education level: Not on file  Occupational History  . Not on file  Social Needs  . Financial resource strain: Not on file  . Food insecurity:    Worry: Not on file    Inability: Not on file  . Transportation needs:    Medical: No    Non-medical: No  Tobacco Use  . Smoking status: Never Smoker  . Smokeless tobacco: Never Used  Substance and Sexual Activity  . Alcohol use: No    Comment: no alcohol for 30 years.   . Drug use: No  . Sexual activity: Not on file  Lifestyle  . Physical activity:    Days per week: Not on file    Minutes per session: Not on file  . Stress: Not on file  Relationships  . Social connections:    Talks on phone: Not on file    Gets together: Not on file    Attends religious service: Not on file    Active member of club or organization: Not on file    Attends  meetings of clubs or organizations: Not on file    Relationship status: Not on file  . Intimate partner violence:    Fear of current or ex partner: No    Emotionally abused: No    Physically abused: No    Forced sexual activity: No  Other Topics Concern  . Not on file  Social History Narrative  . Not on file    FAMILY HISTORY: No family history on file.  ALLERGIES:  is allergic to other.  MEDICATIONS:  Current Outpatient Medications  Medication Sig Dispense Refill  . benzonatate (TESSALON) 100 MG capsule Take 1 capsule (100 mg total) by mouth 3 (three) times daily as needed for cough. (Patient not taking: Reported on 04/18/2018) 20 capsule 0  . clotrimazole (LOTRIMIN AF) 1 % cream Apply 1 application topically 2 (two) times daily. To rash on rt leg (Patient not taking: Reported on 05/24/2018) 60 g 0  . enalapril-hydrochlorothiazide (VASERETIC) 10-25 MG tablet Take 1 tablet by mouth daily.     Marland Kitchen lidocaine (XYLOCAINE) 2 % solution Patient: Mix 1part 2% viscous lidocaine,  1part H20. Swish & swallow 13mL of diluted mixture, 55min before meals and at bedtime, up to QID (Patient not taking: Reported on 04/18/2018) 100 mL 5  . lidocaine-prilocaine (EMLA) cream Apply to affected area once (Patient not taking: Reported on 07/17/2018) 30 g 3  . LORazepam (LORAZEPAM INTENSOL) 2 MG/ML concentrated solution Take 0.5 mLs (1 mg total) by mouth as directed. 30 mins prior to radiation (Patient not taking: Reported on 04/18/2018) 15 mL 0  . metoCLOPramide (REGLAN) 5 MG/5ML solution Place 5 mLs (5 mg total) into feeding tube 3 (three) times daily with meals as needed for nausea or vomiting. Instead of Compazine. (Patient not taking: Reported on 04/18/2018) 120 mL 0  . Nutritional Supplements (FEEDING SUPPLEMENT, OSMOLITE 1.5 CAL,) LIQD Begin 1 carton Osmolite 1.5 via PEG QID with 120 mL free water before and after. Increase to 1 1/2 cartons on Thursday as tolerated. Increase to 2 cartons bid and 1 1/2 cartons bid on Saturday. Flush tube with additional 240 mL free water TID between feedings. (Patient not taking: Reported on 07/17/2018) 7 Bottle 0  . ondansetron (ZOFRAN) 8 MG tablet Take 1 tablet (8 mg total) by mouth 2 (two) times daily as needed. Start on the third day after chemotherapy. (Patient not taking: Reported on 04/18/2018) 30 tablet 1  . oxyCODONE (ROXICODONE INTENSOL) 20 MG/ML concentrated solution Take 0.5 mLs (10 mg total) by mouth every 4 (four) hours as needed for severe pain or breakthrough pain. (Patient not taking: Reported on 04/18/2018) 30 mL 0  . potassium chloride (KLOR-CON) 20 MEQ packet Place 20 mEq into feeding tube daily. Mixed in 139ml of water (Patient not taking: Reported on 04/18/2018) 10 packet 0  . senna-docusate (SENNA S) 8.6-50 MG tablet Take 2 tablets by mouth at bedtime as needed for mild constipation or moderate constipation. (Patient not taking: Reported on 07/17/2018) 60 tablet 1  . sodium fluoride (FLUORISHIELD) 1.1 % GEL dental gel Instill one drop of gel per  tooth space of fluoride tray. Place over teeth for 5 minutes. Remove. Spit out excess. Repeat nightly. (Patient not taking: Reported on 07/17/2018) 120 mL prn   No current facility-administered medications for this visit.     REVIEW OF SYSTEMS:    A 10+ POINT REVIEW OF SYSTEMS WAS OBTAINED including neurology, dermatology, psychiatry, cardiac, respiratory, lymph, extremities, GI, GU, Musculoskeletal, constitutional, breasts, reproductive, HEENT.  All pertinent positives are  noted in the HPI.  All others are negative.   PHYSICAL EXAMINATION: ECOG PERFORMANCE STATUS: 1 - Symptomatic but completely ambulatory  VS reviewed  GENERAL:alert, in no acute distress and comfortable SKIN: no acute rashes, no significant lesions EYES: conjunctiva are pink and non-injected, sclera anicteric OROPHARYNX: MMM, no exudates, no oropharyngeal erythema or ulceration NECK: supple, no JVD LYMPH:  no palpable lymphadenopathy in the cervical, axillary or inguinal regions LUNGS: clear to auscultation b/l with normal respiratory effort HEART: regular rate & rhythm ABDOMEN:  normoactive bowel sounds , non tender, not distended. No palpable hepatosplenomegaly.  Extremity: no pedal edema PSYCH: alert & oriented x 3 with fluent speech NEURO: no focal motor/sensory deficits   LABORATORY DATA:  I have reviewed the data as listed  . CBC Latest Ref Rng & Units 09/17/2018 07/27/2018 07/16/2018  WBC 4.0 - 10.5 K/uL 4.1 4.0 3.4(L)  Hemoglobin 13.0 - 17.0 g/dL 13.7 13.7 13.1  Hematocrit 39.0 - 52.0 % 40.5 42.1 40.5  Platelets 150 - 400 K/uL 160 160 148(L)   . CBC    Component Value Date/Time   WBC 4.1 09/17/2018 1337   RBC 4.72 09/17/2018 1337   HGB 13.7 09/17/2018 1337   HGB 16.6 02/12/2018 1341   HCT 40.5 09/17/2018 1337   PLT 160 09/17/2018 1337   PLT 250 02/12/2018 1341   MCV 85.8 09/17/2018 1337   MCH 29.0 09/17/2018 1337   MCHC 33.8 09/17/2018 1337   RDW 12.8 09/17/2018 1337   LYMPHSABS 0.9 09/17/2018  1337   MONOABS 0.4 09/17/2018 1337   EOSABS 0.2 09/17/2018 1337   BASOSABS 0.0 09/17/2018 1337    . CMP Latest Ref Rng & Units 09/17/2018 07/16/2018 05/24/2018  Glucose 70 - 99 mg/dL 105(H) 101(H) 105(H)  BUN 6 - 20 mg/dL 11 9 11   Creatinine 0.61 - 1.24 mg/dL 0.88 0.82 0.79  Sodium 135 - 145 mmol/L 142 143 142  Potassium 3.5 - 5.1 mmol/L 4.1 3.8 3.7  Chloride 98 - 111 mmol/L 107 108 104  CO2 22 - 32 mmol/L 29 25 28   Calcium 8.9 - 10.3 mg/dL 9.1 9.5 9.5  Total Protein 6.5 - 8.1 g/dL 6.6 6.6 6.6  Total Bilirubin 0.3 - 1.2 mg/dL 0.3 0.4 0.4  Alkaline Phos 38 - 126 U/L 71 63 77  AST 15 - 41 U/L 8(L) 11(L) 13(L)  ALT 0 - 44 U/L 7 8 15    Component     Latest Ref Rng & Units 04/26/2018  Magnesium     1.7 - 2.4 mg/dL 1.8  Phosphorus     2.5 - 4.6 mg/dL 3.4  TSH     0.320 - 4.118 uIU/mL 1.872     11/10/17 Comprehensive Hearing Test:    12/27/17 Surgical Pathology:    RADIOGRAPHIC STUDIES: I have personally reviewed the radiological images as listed and agreed with the findings in the report. No results found.  ASSESSMENT & PLAN:   53 y.o. 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 Malignancy cannot be excluded. Consider follow-up CT chest without contrast in 3-4 weeks in further initial evaluation, as  clinically indicated. 2. Mild focal hypermetabolism anterior to the left aspect of the hyoid bone, without a definite CT correlate. 3. Tiny left renal stone.  2. Constipation  3. Radiation Mucositis - grade 2  -resolved  4. Nodule in LLL of lung PLAN:  -Discussed pt labwork today, 09/17/18; blood counts normalized, chemistries are stable  -Discussed the 08/17/18 CT Chest which revealed Interval decrease in overall volume of subpleural consolidation within the medial left lower lobe. Findings are likely the sequelae of inflammatory or infectious process. Consider repeat CT of the chest in 3 months to ensure complete resolution. 2. Unchanged appearance of small subpleural nodules within the right lower lobe and left upper lobe. 3.  Aortic Atherosclerosis 4. Coronary artery atherosclerotic calcifications. -The pt shows no clinical, radiographic, or lab progression of his tonsillar carcinoma at this time.  -No indication for further treatment at this time.   -Continue follow up with ENT Dr. Lucia Gaskins for local evaluation and examination every 3 months -Encouraged the patient to supplement his 3 meals a day with Boost or Ensure  -No indication for barium swallow study -Will see the pt back in 6 months  5. Eczematoid skin rash on his lower extremities RT>left. Resolved with anti fungal - fluconazole. Plan -completed po fluconazole with near resolution of rash -conitnue topical anti-fungals . -Continue moisturizing previously fungal-affected skin areas on bilateral LE, use colloidal silver soap for anti-fungal prevention    RTC with Dr Irene Limbo with labs in 6 months    All of the patients questions were answered with apparent satisfaction. The patient knows to call the clinic with any problems, questions or concerns.  The total time spent in the appt was 25 minutes and more than 50% was on counseling and direct patient cares.    Sullivan Lone MD MS AAHIVMS [facility] 2 Hematology/Oncology Physician [facility]  (Office):       [phone] (Work cell):  [phone] (Fax):           [phone]  09/17/2018 2:32 PM  I, Baldwin Jamaica, am acting as a scribe for Dr. Sullivan Lone.   .I have reviewed  the above documentation for accuracy and completeness, and I agree with the above. Brunetta Genera MD

## 2018-09-17 ENCOUNTER — Inpatient Hospital Stay: Payer: PRIVATE HEALTH INSURANCE | Attending: Hematology | Admitting: Hematology

## 2018-09-17 ENCOUNTER — Inpatient Hospital Stay: Payer: PRIVATE HEALTH INSURANCE

## 2018-09-17 VITALS — BP 124/81 | HR 56 | Temp 98.4°F | Resp 18 | Ht 74.0 in | Wt 231.3 lb

## 2018-09-17 DIAGNOSIS — R911 Solitary pulmonary nodule: Secondary | ICD-10-CM | POA: Insufficient documentation

## 2018-09-17 DIAGNOSIS — Z79899 Other long term (current) drug therapy: Secondary | ICD-10-CM | POA: Insufficient documentation

## 2018-09-17 DIAGNOSIS — K5900 Constipation, unspecified: Secondary | ICD-10-CM

## 2018-09-17 DIAGNOSIS — C099 Malignant neoplasm of tonsil, unspecified: Secondary | ICD-10-CM | POA: Insufficient documentation

## 2018-09-17 DIAGNOSIS — C090 Malignant neoplasm of tonsillar fossa: Secondary | ICD-10-CM

## 2018-09-17 DIAGNOSIS — J984 Other disorders of lung: Secondary | ICD-10-CM

## 2018-09-17 LAB — CBC WITH DIFFERENTIAL/PLATELET
Abs Immature Granulocytes: 0.01 10*3/uL (ref 0.00–0.07)
Basophils Absolute: 0 10*3/uL (ref 0.0–0.1)
Basophils Relative: 1 %
Eosinophils Absolute: 0.2 10*3/uL (ref 0.0–0.5)
Eosinophils Relative: 4 %
HCT: 40.5 % (ref 39.0–52.0)
Hemoglobin: 13.7 g/dL (ref 13.0–17.0)
Immature Granulocytes: 0 %
Lymphocytes Relative: 22 %
Lymphs Abs: 0.9 10*3/uL (ref 0.7–4.0)
MCH: 29 pg (ref 26.0–34.0)
MCHC: 33.8 g/dL (ref 30.0–36.0)
MCV: 85.8 fL (ref 80.0–100.0)
Monocytes Absolute: 0.4 10*3/uL (ref 0.1–1.0)
Monocytes Relative: 9 %
Neutro Abs: 2.6 10*3/uL (ref 1.7–7.7)
Neutrophils Relative %: 64 %
Platelets: 160 10*3/uL (ref 150–400)
RBC: 4.72 MIL/uL (ref 4.22–5.81)
RDW: 12.8 % (ref 11.5–15.5)
WBC: 4.1 10*3/uL (ref 4.0–10.5)
nRBC: 0 % (ref 0.0–0.2)

## 2018-09-17 LAB — CMP (CANCER CENTER ONLY)
ALT: 7 U/L (ref 0–44)
AST: 8 U/L — ABNORMAL LOW (ref 15–41)
Albumin: 3.7 g/dL (ref 3.5–5.0)
Alkaline Phosphatase: 71 U/L (ref 38–126)
Anion gap: 6 (ref 5–15)
BUN: 11 mg/dL (ref 6–20)
CO2: 29 mmol/L (ref 22–32)
Calcium: 9.1 mg/dL (ref 8.9–10.3)
Chloride: 107 mmol/L (ref 98–111)
Creatinine: 0.88 mg/dL (ref 0.61–1.24)
GFR, Est AFR Am: >60 mL/min (ref >60)
Glucose, Bld: 105 mg/dL — ABNORMAL HIGH (ref 70–99)
Potassium: 4.1 mmol/L (ref 3.5–5.1)
Sodium: 142 mmol/L (ref 135–145)
Total Bilirubin: 0.3 mg/dL (ref 0.3–1.2)
Total Protein: 6.6 g/dL (ref 6.5–8.1)

## 2018-09-19 ENCOUNTER — Telehealth: Payer: Self-pay

## 2018-09-19 NOTE — Telephone Encounter (Signed)
Per 2/3 los left a detailed msg. And mailed a letter with a calender enclosed

## 2018-10-04 ENCOUNTER — Encounter: Payer: Self-pay | Admitting: *Deleted

## 2018-10-04 NOTE — Progress Notes (Signed)
Oncology Nurse Navigator Documentation  Delivered to Ivin Poot, HIM, [facility] Disability Claim Form delivered by pt yesterday, 2/19.  [name], RN, BSN Head & Neck Oncology Nurse [place] at [place] [phone]

## 2018-10-17 NOTE — Progress Notes (Signed)
Disability claim form successfully faxed to [place] at [phone]. Mailed copy to patient address on file.

## 2018-10-18 ENCOUNTER — Telehealth: Payer: Self-pay | Admitting: *Deleted

## 2018-10-18 NOTE — Telephone Encounter (Signed)
Oncology Nurse Navigator Documentation  Returned call to pt, informed insurance claim forms faxed to [facility] yesterday, copy mailed to his attention.  He voiced appreciation for update.  [name], RN, BSN Head & Neck Oncology Nurse [place] at [place] [phone]

## 2019-03-19 NOTE — Progress Notes (Signed)
HEMATOLOGY/ONCOLOGY CLINIC NOTE  Date of Service: 03/19/2019  Patient Care Team: Celene Squibb, MD as PCP - General (Internal Medicine) Rozetta Nunnery, MD as Consulting Physician (Otolaryngology) Eppie Gibson, MD as Attending Physician (Radiation Oncology) Brunetta Genera, MD as Consulting Physician (Hematology) Leota Sauers, RN as Oncology Nurse Navigator Karie Mainland, RD as Dietitian (Nutrition) Valentino Saxon [place], CCC-SLP as Speech Language Pathologist (Speech Pathology) Wynelle Beckmann, Melodie Bouillon, PT as Physical Therapist (Physical Therapy) Kennith Center, LCSW as Social Worker  CHIEF COMPLAINTS/PURPOSE OF CONSULTATION:  F/u for Tonsillar carcinoma   HISTORY OF PRESENTING ILLNESS:   Travis Mcdonald is a wonderful 53 y.o. 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 He is accompanied today by his wife. The pt reports that he is doing well overall.   The pt reports first noticing an enlargement of his left neck around January 2019. He believed these to be sinus related since he took sinus medications which decreased the swelling. He notes aggravating pain but denies significant pain or problems swallowing. He reports left ear and jaw pain as well.   Four teeth extraction on 01/19/18 with radiation planning this afternoon and tentative radiation beginning 02/05/18.   He denies any ear infection concerns after his audiogram. He denies recurrent ear infections but notes that he had two ear infections last year.    The pt notes that he will try to continue work each day.  He adds that he has a skin rash on his right upper leg which is recurrent in the summers. He denies seeing a dermatologist in the past and treats the associated itching with cortisone and benadryl.   He notes that he does snore at night and his wife notes that he does not snore when he sleeps on his side.   Of note prior to the patient's visit  today, pt has had PET/CT completed on 01/04/18 with results revealing Extensive hypermetabolic activity in the left neck, index lesion maximum SUV 20.3. Looking for a primary lesion in the neck, there is some very faint asymmetry of the left palatine tonsillar pillar which could conceivably represent a small lesion. 2. No significant abnormal activity in the chest, abdomen/pelvis, or skeleton. 3. Other imaging findings of potential clinical significance: Aortic Atherosclerosis (ICD10-I70.0). Coronary atherosclerosis. Small mucous retention cyst in left maxillary sinus. Diffuse hepatic steatosis. Nonspecific but probably benign hypodense lesions in the lateral segment left hepatic lobe.   Most recent lab results (01/24/18) of CBC w/diff, and CMP  is as follows: all values are WNL except glucose at 162. Magnesium 01/24/18 is WNL at 1.8 Phosphorus 01/24/18 is WNL at 2.7  On review of systems, pt reports left ear pain, left neck discomfort, left jaw pain, upper right leg rash, and denies abdominal pains, other skin rashes, and any other symptoms.   On PMHx the pt reports 1998 and 1999 back surgeries for ruptured disks with microdiscectomy, high blood pressure, denies diabetes, heart, lung or kidney problems. 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 Denies ever smoking cigarettes. Denies ETOH consumption in 30 years. Reports infrequent marijuana smoking 30 years ago.  On Family Hx the pt reports maternal cousin with brain cancer, maternal cousin with pancreatic cancer, maternal uncle with cancer. Denies much paternal knowledge.   Interval History:   [name] returns today regarding his tonsillar carcinoma. The patient's last visit with [place] was  on 09/17/2018. The pt reports that she is doing well overall.  The pt reports eating "decent." He reports that some foods do not taste the same as they did before. He has to drink a lot of water while he is eating. The pt's  weight has been steady and his energy levels have been good. He is not taking any medications at this time.  Lab results today (03/20/2019) of CBC w/diff and CMP is as follows: all values are WNL except for WBC at 3.8k, AST at 11. 03/20/2019 TSH is at 5.867  On review of systems, pt reports good energy levels, changes in taste, neck soreness and denies back pain weight changes, belly pain and any other symptoms.   MEDICAL HISTORY:  Past Medical History:  Diagnosis Date  . History of radiation therapy 02/05/18- 03/28/18   Left tonsil and neck bilaterally, 2 Gy X 35 fractions for a total dose of 70 Gy.   Marland Kitchen Hypertension     SURGICAL HISTORY: Past Surgical History:  Procedure Laterality Date  . [place]   ruptured lower back disc surgery. L4 & L5 Lumber disc  . IR GASTROSTOMY TUBE MOD SED  02/02/2018  . IR GASTROSTOMY TUBE REMOVAL  07/27/2018  . IR IMAGING GUIDED PORT INSERTION  02/02/2018  . IR REMOVAL TUN ACCESS W/ PORT W/O FL MOD SED  07/27/2018    SOCIAL HISTORY: Social History   Socioeconomic History  . Marital status: Married    Spouse name: Not on file  . Number of children: Not on file  . Years of education: Not on file  . Highest education level: Not on file  Occupational History  . Not on file  Social Needs  . Financial resource strain: Not on file  . Food insecurity    Worry: Not on file    Inability: Not on file  . Transportation needs    Medical: No    Non-medical: No  Tobacco Use  . Smoking status: Never Smoker  . Smokeless tobacco: Never Used  Substance and Sexual Activity  . Alcohol use: No    Comment: no alcohol for 30 years.   . Drug use: No  . Sexual activity: Not on file  Lifestyle  . Physical activity    Days per week: Not on file    Minutes per session: Not on file  . Stress: Not on file  Relationships  . Social Herbalist on phone: Not on file    Gets together: Not on file    Attends religious service: Not on file     Active member of club or organization: Not on file    Attends meetings of clubs or organizations: Not on file    Relationship status: Not on file  . Intimate partner violence    Fear of current or ex partner: No    Emotionally abused: No    Physically abused: No    Forced sexual activity: No  Other Topics Concern  . Not on file  Social History Narrative  . Not on file    FAMILY HISTORY: No family history on file.  ALLERGIES:  is allergic to other.  MEDICATIONS:  Current Outpatient Medications  Medication Sig Dispense Refill  . benzonatate (TESSALON) 100 MG capsule Take 1 capsule (100 mg total) by mouth 3 (three) times daily as needed for cough. (Patient not taking: Reported on 04/18/2018) 20 capsule 0  . clotrimazole (LOTRIMIN AF) 1 % cream Apply 1  application topically 2 (two) times daily. To rash on rt leg (Patient not taking: Reported on 05/24/2018) 60 g 0  . enalapril-hydrochlorothiazide (VASERETIC) 10-25 MG tablet Take 1 tablet by mouth daily.     Marland Kitchen lidocaine (XYLOCAINE) 2 % solution Patient: Mix 1part 2% viscous lidocaine, 1part H20. Swish & swallow 72mL of diluted mixture, 66min before meals and at bedtime, up to QID (Patient not taking: Reported on 04/18/2018) 100 mL 5  . lidocaine-prilocaine (EMLA) cream Apply to affected area once (Patient not taking: Reported on 07/17/2018) 30 g 3  . LORazepam (LORAZEPAM INTENSOL) 2 MG/ML concentrated solution Take 0.5 mLs (1 mg total) by mouth as directed. 30 mins prior to radiation (Patient not taking: Reported on 04/18/2018) 15 mL 0  . metoCLOPramide (REGLAN) 5 MG/5ML solution Place 5 mLs (5 mg total) into feeding tube 3 (three) times daily with meals as needed for nausea or vomiting. Instead of Compazine. (Patient not taking: Reported on 04/18/2018) 120 mL 0  . Nutritional Supplements (FEEDING SUPPLEMENT, OSMOLITE 1.5 CAL,) LIQD Begin 1 carton Osmolite 1.5 via PEG QID with 120 mL free water before and after. Increase to 1 1/2 cartons on Thursday  as tolerated. Increase to 2 cartons bid and 1 1/2 cartons bid on Saturday. Flush tube with additional 240 mL free water TID between feedings. (Patient not taking: Reported on 07/17/2018) 7 Bottle 0  . ondansetron (ZOFRAN) 8 MG tablet Take 1 tablet (8 mg total) by mouth 2 (two) times daily as needed. Start on the third day after chemotherapy. (Patient not taking: Reported on 04/18/2018) 30 tablet 1  . oxyCODONE (ROXICODONE INTENSOL) 20 MG/ML concentrated solution Take 0.5 mLs (10 mg total) by mouth every 4 (four) hours as needed for severe pain or breakthrough pain. (Patient not taking: Reported on 04/18/2018) 30 mL 0  . potassium chloride (KLOR-CON) 20 MEQ packet Place 20 mEq into feeding tube daily. Mixed in 156ml of water (Patient not taking: Reported on 04/18/2018) 10 packet 0  . senna-docusate (SENNA S) 8.6-50 MG tablet Take 2 tablets by mouth at bedtime as needed for mild constipation or moderate constipation. (Patient not taking: Reported on 07/17/2018) 60 tablet 1  . sodium fluoride (FLUORISHIELD) 1.1 % GEL dental gel Instill one drop of gel per tooth space of fluoride tray. Place over teeth for 5 minutes. Remove. Spit out excess. Repeat nightly. (Patient not taking: Reported on 07/17/2018) 120 mL prn   No current facility-administered medications for this visit.     REVIEW OF SYSTEMS:    A 10+ POINT REVIEW OF SYSTEMS WAS OBTAINED including neurology, dermatology, psychiatry, cardiac, respiratory, lymph, extremities, GI, GU, Musculoskeletal, constitutional, breasts, reproductive, HEENT.  All pertinent positives are noted in the HPI.  All others are negative.   PHYSICAL EXAMINATION: ECOG PERFORMANCE STATUS: 1 - Symptomatic but completely ambulatory  .BP 130/79 (BP Location: Left Arm, Patient Position: Sitting)   Pulse (!) 58   Temp 99.1 F (37.3 C) (Temporal)   Resp 18   Ht 6\' 2"  (1.88 m)   Wt 235 lb 3.2 oz (106.7 kg)   SpO2 100%   BMI 30.20 kg/m   GENERAL:alert, in no acute distress and  comfortable SKIN: no acute rashes, no significant lesions EYES: conjunctiva are pink and non-injected, sclera anicteric OROPHARYNX: MMM, no exudates, no oropharyngeal erythema or ulceration NECK: supple, no JVD LYMPH:  no palpable lymphadenopathy in the cervical, axillary or inguinal regions LUNGS: clear to auscultation b/l with normal respiratory effort HEART: regular rate & rhythm ABDOMEN:  normoactive bowel sounds , non tender, not distended. No palpable hepatosplenomegaly.  Extremity: no pedal edema PSYCH: alert & oriented x 3 with fluent speech NEURO: no focal motor/sensory deficits   LABORATORY DATA:  I have reviewed the data as listed  . CBC Latest Ref Rng & Units 03/20/2019 09/17/2018 07/27/2018  WBC 4.0 - 10.5 K/uL 3.8(L) 4.1 4.0  Hemoglobin 13.0 - 17.0 g/dL 15.4 13.7 13.7  Hematocrit 39.0 - 52.0 % 46.1 40.5 42.1  Platelets 150 - 400 K/uL 172 160 160   . CBC    Component Value Date/Time   WBC 3.8 (L) 03/20/2019 1258   RBC 5.31 03/20/2019 1258   HGB 15.4 03/20/2019 1258   HGB 16.6 02/12/2018 1341   HCT 46.1 03/20/2019 1258   PLT 172 03/20/2019 1258   PLT 250 02/12/2018 1341   MCV 86.8 03/20/2019 1258   MCH 29.0 03/20/2019 1258   MCHC 33.4 03/20/2019 1258   RDW 12.4 03/20/2019 1258   LYMPHSABS 1.0 03/20/2019 1258   MONOABS 0.3 03/20/2019 1258   EOSABS 0.1 03/20/2019 1258   BASOSABS 0.0 03/20/2019 1258    . CMP Latest Ref Rng & Units 03/20/2019 09/17/2018 07/16/2018  Glucose 70 - 99 mg/dL 88 105(H) 101(H)  BUN 6 - 20 mg/dL 15 11 9   Creatinine 0.61 - 1.24 mg/dL 1.02 0.88 0.82  Sodium 135 - 145 mmol/L 141 142 143  Potassium 3.5 - 5.1 mmol/L 4.2 4.1 3.8  Chloride 98 - 111 mmol/L 105 107 108  CO2 22 - 32 mmol/L 26 29 25   Calcium 8.9 - 10.3 mg/dL 9.5 9.1 9.5  Total Protein 6.5 - 8.1 g/dL 7.0 6.6 6.6  Total Bilirubin 0.3 - 1.2 mg/dL 0.4 0.3 0.4  Alkaline Phos 38 - 126 U/L 82 71 63  AST 15 - 41 U/L 11(L) 8(L) 11(L)  ALT 0 - 44 U/L 11 7 8      11/10/17 Comprehensive  Hearing Test:    12/27/17 Surgical Pathology:    RADIOGRAPHIC STUDIES: I have personally reviewed the radiological images as listed and agreed with the findings in the report. No results found.  ASSESSMENT & PLAN:   53 y.o. 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 Malignancy cannot be excluded. Consider follow-up CT chest without contrast in 3-4 weeks in further initial evaluation, as clinically indicated. 2. Mild focal hypermetabolism anterior to the left aspect of the hyoid bone, without a definite CT correlate. 3. Tiny left renal stone.   08/17/18 CT Chest which revealed Interval decrease in overall volume of subpleural consolidation within the medial left lower lobe. Findings are likely the sequelae of inflammatory or infectious process. Consider repeat CT of the chest in 3 months to ensure complete resolution. 2. Unchanged appearance of small subpleural nodules within the right lower lobe and left upper lobe. 3.  Aortic Atherosclerosis 4. Coronary artery atherosclerotic calcifications.  2. Constipation  3. Radiation Mucositis - grade 2 -resolved  4. Nodule in LLL of lung  PLAN:  -Discussed pt labwork today, 03/20/2019; TSH thyroid numbers are starting to creep up -The pt shows no clinical or  lab progression of his tonsillar carcinoma at this time.  -No indication for further treatment at this time.   -Continue follow up with ENT Dr. Lucia Gaskins for local evaluation and examination every 3 months -The pt saw Dr. Lucia Gaskins in  02/2019 and he had no new concerns -Will repeat thyroid labs in 3 months and will consider thyroid replacement based on the results -CT neck and CT chest in 15 weeks -Will see the pt back in 4 months  5. Eczematoid skin rash on his lower extremities RT>left. Resolved with anti fungal - fluconazole. Plan -Completed PO fluconazole with near resolution of rash -Continue topical anti-fungals -Continue moisturizing previously fungal-affected skin areas on bilateral LE, use colloidal silver soap for anti-fungal prevention    CT neck and CT chest in 15 weeks with labs RTC with Dr Irene Limbo in 4 months    All of the patients questions were answered with apparent satisfaction. The patient knows to call the clinic with any problems, questions or concerns.  The total time spent in the appt was 15 minutes and more than 50% was on counseling and direct patient cares.  Sullivan Lone MD MS AAHIVMS [facility] Hematology/Oncology Physician [facility]  (Office):       [phone] (Work cell):  [phone] (Fax):           [phone]  03/19/2019 2:18 PM  I, De Burrs, am acting as a scribe for Dr. Irene Limbo  .I have reviewed the above documentation for accuracy and completeness, and I agree with the above. Brunetta Genera MD

## 2019-03-20 ENCOUNTER — Inpatient Hospital Stay: Payer: PRIVATE HEALTH INSURANCE

## 2019-03-20 ENCOUNTER — Inpatient Hospital Stay: Payer: PRIVATE HEALTH INSURANCE | Attending: Hematology | Admitting: Hematology

## 2019-03-20 ENCOUNTER — Other Ambulatory Visit: Payer: Self-pay

## 2019-03-20 VITALS — BP 130/79 | HR 58 | Temp 99.1°F | Resp 18 | Ht 74.0 in | Wt 235.2 lb

## 2019-03-20 DIAGNOSIS — C090 Malignant neoplasm of tonsillar fossa: Secondary | ICD-10-CM

## 2019-03-20 DIAGNOSIS — K5900 Constipation, unspecified: Secondary | ICD-10-CM | POA: Insufficient documentation

## 2019-03-20 DIAGNOSIS — C099 Malignant neoplasm of tonsil, unspecified: Secondary | ICD-10-CM | POA: Insufficient documentation

## 2019-03-20 DIAGNOSIS — J984 Other disorders of lung: Secondary | ICD-10-CM | POA: Diagnosis not present

## 2019-03-20 DIAGNOSIS — Z79899 Other long term (current) drug therapy: Secondary | ICD-10-CM | POA: Insufficient documentation

## 2019-03-20 DIAGNOSIS — R918 Other nonspecific abnormal finding of lung field: Secondary | ICD-10-CM | POA: Diagnosis not present

## 2019-03-20 LAB — CMP (CANCER CENTER ONLY)
ALT: 11 U/L (ref 0–44)
AST: 11 U/L — ABNORMAL LOW (ref 15–41)
Albumin: 4.2 g/dL (ref 3.5–5.0)
Alkaline Phosphatase: 82 U/L (ref 38–126)
Anion gap: 10 (ref 5–15)
BUN: 15 mg/dL (ref 6–20)
CO2: 26 mmol/L (ref 22–32)
Calcium: 9.5 mg/dL (ref 8.9–10.3)
Chloride: 105 mmol/L (ref 98–111)
Creatinine: 1.02 mg/dL (ref 0.61–1.24)
GFR, Est AFR Am: >60 mL/min (ref >60)
GFR, Estimated: >60 mL/min (ref >60)
Glucose, Bld: 88 mg/dL (ref 70–99)
Potassium: 4.2 mmol/L (ref 3.5–5.1)
Sodium: 141 mmol/L (ref 135–145)
Total Bilirubin: 0.4 mg/dL (ref 0.3–1.2)
Total Protein: 7 g/dL (ref 6.5–8.1)

## 2019-03-20 LAB — CBC WITH DIFFERENTIAL/PLATELET
Abs Immature Granulocytes: 0.02 10*3/uL (ref 0.00–0.07)
Basophils Absolute: 0 10*3/uL (ref 0.0–0.1)
Basophils Relative: 0 %
Eosinophils Absolute: 0.1 10*3/uL (ref 0.0–0.5)
Eosinophils Relative: 3 %
HCT: 46.1 % (ref 39.0–52.0)
Hemoglobin: 15.4 g/dL (ref 13.0–17.0)
Immature Granulocytes: 1 %
Lymphocytes Relative: 25 %
Lymphs Abs: 1 10*3/uL (ref 0.7–4.0)
MCH: 29 pg (ref 26.0–34.0)
MCHC: 33.4 g/dL (ref 30.0–36.0)
MCV: 86.8 fL (ref 80.0–100.0)
Monocytes Absolute: 0.3 10*3/uL (ref 0.1–1.0)
Monocytes Relative: 8 %
Neutro Abs: 2.4 10*3/uL (ref 1.7–7.7)
Neutrophils Relative %: 63 %
Platelets: 172 10*3/uL (ref 150–400)
RBC: 5.31 MIL/uL (ref 4.22–5.81)
RDW: 12.4 % (ref 11.5–15.5)
WBC: 3.8 10*3/uL — ABNORMAL LOW (ref 4.0–10.5)
nRBC: 0 % (ref 0.0–0.2)

## 2019-03-20 LAB — TSH: TSH: 5.867 u[IU]/mL — ABNORMAL HIGH (ref 0.320–4.118)

## 2019-03-21 ENCOUNTER — Telehealth: Payer: Self-pay | Admitting: Hematology

## 2019-03-21 NOTE — Telephone Encounter (Signed)
Scheduled appt per 8/5 los.  Printed and mailed appt calendar.

## 2019-05-10 ENCOUNTER — Telehealth: Payer: Self-pay

## 2019-05-10 NOTE — Telephone Encounter (Signed)
Returned TC to pt in regard to wanting to know if Dr Irene Limbo still wanted him to get an MRI done. Pt stated that at last appointment Dr. Irene Limbo mentioned that he would be contact able when MRI was scheduled and he has not heard anything. Pt also reached out to radiology who told him that there is no order in for him to get an MRI. I let patient know that Dr Irene Limbo had left for the day but that I would be sure to leave a message for him stating his concern.

## 2019-05-16 ENCOUNTER — Other Ambulatory Visit: Payer: Self-pay | Admitting: Hematology

## 2019-05-16 DIAGNOSIS — C760 Malignant neoplasm of head, face and neck: Secondary | ICD-10-CM

## 2019-07-03 ENCOUNTER — Other Ambulatory Visit: Payer: Self-pay | Admitting: *Deleted

## 2019-07-03 DIAGNOSIS — C090 Malignant neoplasm of tonsillar fossa: Secondary | ICD-10-CM

## 2019-07-04 ENCOUNTER — Encounter (HOSPITAL_COMMUNITY): Payer: Self-pay

## 2019-07-04 ENCOUNTER — Inpatient Hospital Stay: Payer: No Typology Code available for payment source | Attending: Hematology

## 2019-07-04 ENCOUNTER — Ambulatory Visit (HOSPITAL_COMMUNITY)
Admission: RE | Admit: 2019-07-04 | Discharge: 2019-07-04 | Disposition: A | Discharge status: Home / Self Care | Payer: PRIVATE HEALTH INSURANCE | Source: Ambulatory Visit | Attending: Hematology | Admitting: Hematology

## 2019-07-04 ENCOUNTER — Ambulatory Visit (INDEPENDENT_AMBULATORY_CARE_PROVIDER_SITE_OTHER): Payer: No Typology Code available for payment source | Admitting: Otolaryngology

## 2019-07-04 ENCOUNTER — Other Ambulatory Visit: Payer: Self-pay

## 2019-07-04 DIAGNOSIS — C090 Malignant neoplasm of tonsillar fossa: Secondary | ICD-10-CM

## 2019-07-04 DIAGNOSIS — C760 Malignant neoplasm of head, face and neck: Secondary | ICD-10-CM | POA: Insufficient documentation

## 2019-07-04 DIAGNOSIS — C099 Malignant neoplasm of tonsil, unspecified: Secondary | ICD-10-CM | POA: Insufficient documentation

## 2019-07-04 LAB — CMP (CANCER CENTER ONLY)
ALT: 14 U/L (ref 0–44)
AST: 11 U/L — ABNORMAL LOW (ref 15–41)
Albumin: 4.2 g/dL (ref 3.5–5.0)
Alkaline Phosphatase: 82 U/L (ref 38–126)
Anion gap: 11 (ref 5–15)
BUN: 13 mg/dL (ref 6–20)
CO2: 27 mmol/L (ref 22–32)
Calcium: 9.3 mg/dL (ref 8.9–10.3)
Chloride: 104 mmol/L (ref 98–111)
Creatinine: 0.93 mg/dL (ref 0.61–1.24)
GFR, Est AFR Am: >60 mL/min (ref >60)
GFR, Estimated: >60 mL/min (ref >60)
Glucose, Bld: 102 mg/dL — ABNORMAL HIGH (ref 70–99)
Potassium: 4.6 mmol/L (ref 3.5–5.1)
Sodium: 142 mmol/L (ref 135–145)
Total Bilirubin: 0.5 mg/dL (ref 0.3–1.2)
Total Protein: 7 g/dL (ref 6.5–8.1)

## 2019-07-04 MED ORDER — IOHEXOL 300 MG/ML  SOLN
75.0000 mL | Freq: Once | INTRAMUSCULAR | Status: AC | PRN
  Administered 2019-07-04: 75 mL via INTRAVENOUS

## 2019-07-04 MED ORDER — SODIUM CHLORIDE (PF) 0.9 % IJ SOLN
INTRAMUSCULAR | Status: AC
  Filled 2019-07-04: qty 50

## 2019-07-09 IMAGING — XA IR PERC PLACEMENT GASTROSTOMY
6 series · 7 of 7 positions shown · non-contrast
Comparison: PET-CT - 01/04/2018

INDICATION: History of head neck cancer - please place percutaneous gastrostomy
tube for enteric nutrition supplementation purposes during impending
chemoradiation.

EXAM:
PULL TROUGH GASTROSTOMY TUBE PLACEMENT

[Series 1: fl (-) angio · 2 of 2 slices shown (1 of 6)]
[im 1/2]
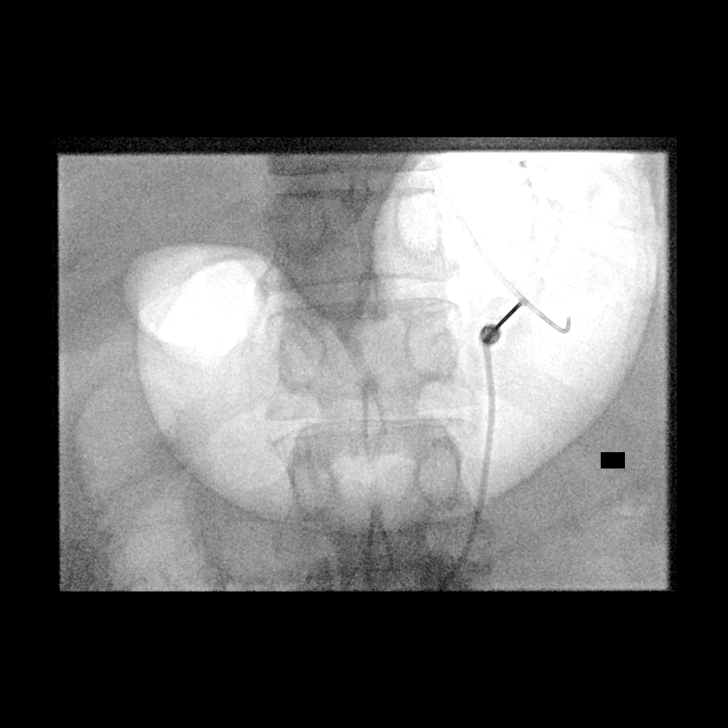
[im 2/2]
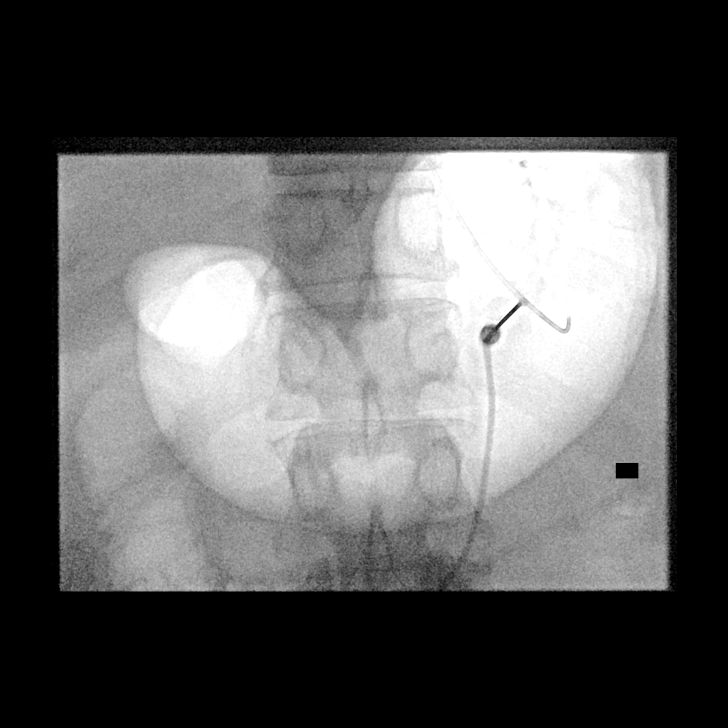

[Series 2: fl (-) angio · 1 of 1 slices shown (2 of 6)]
[im 1/1]
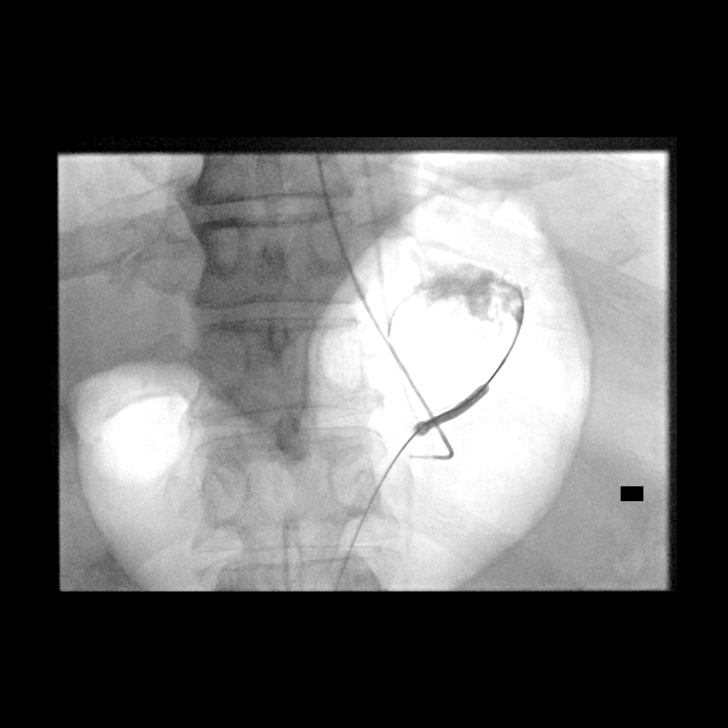

[Series 3: fl (-) angio · 1 of 1 slices shown (3 of 6)]
[im 1/1]
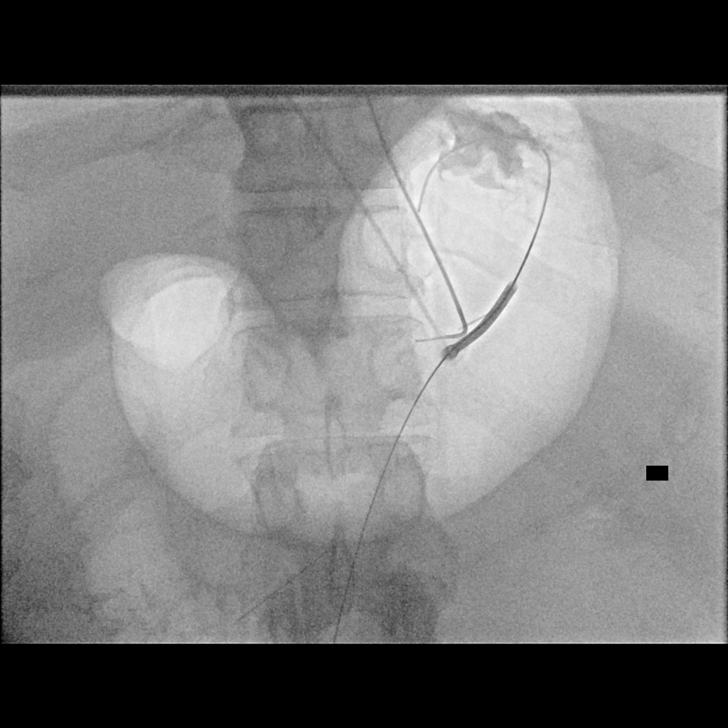

[Series 4: fl (-) angio · 1 of 1 slices shown (4 of 6)]
[im 1/1]
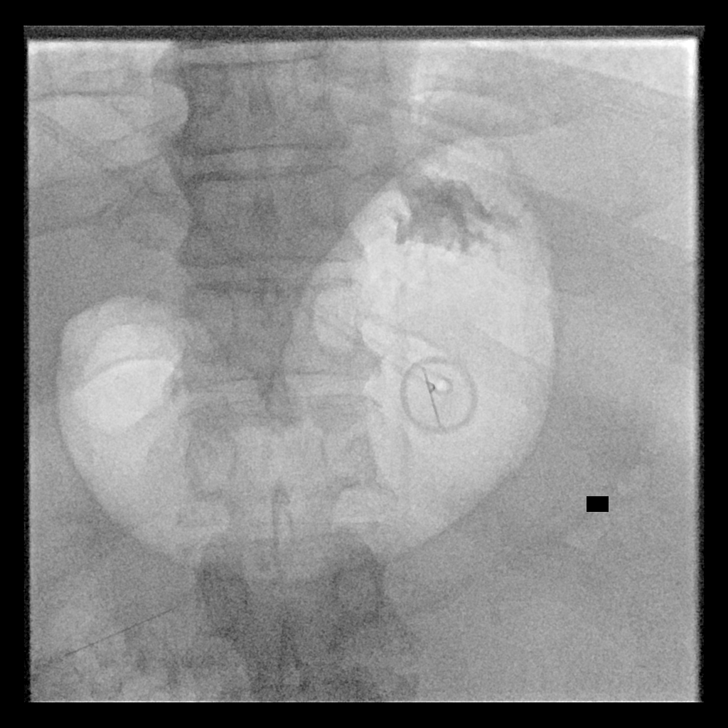

[Series 7: fl (-) angio · 1 of 1 slices shown (5 of 6)]
[im 1/1]
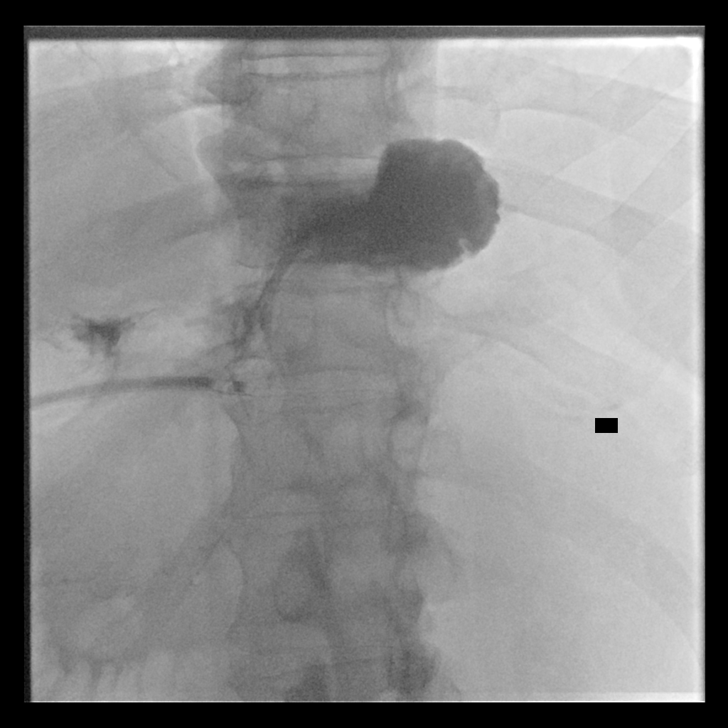

[Series 8: fl (-) angio · 1 of 1 slices shown (6 of 6)]
[im 1/1]
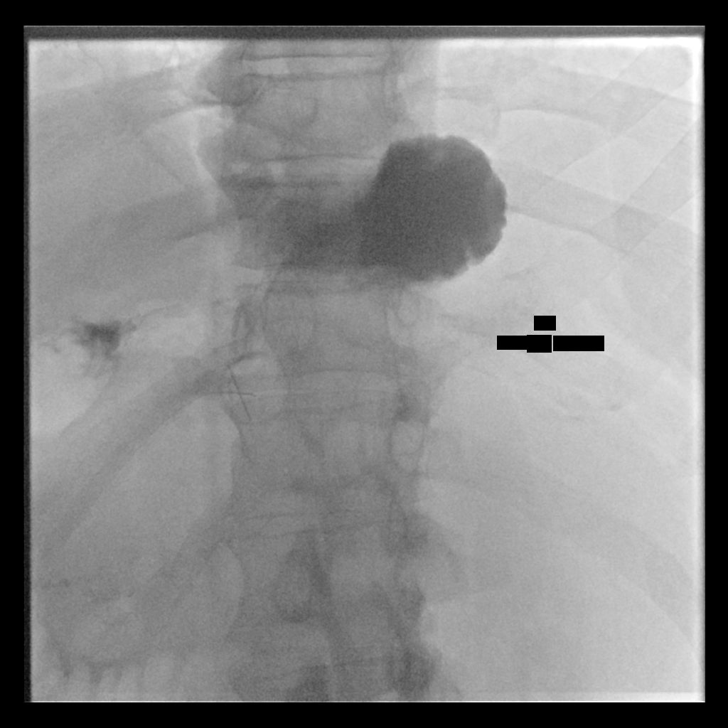

[7 of 7 positions shown; findings below may reference images not displayed]

MEDICATIONS:
Ancef 2 g IV; Antibiotics were administered within 1 hour of the
procedure.

Glucagon 1 mg IV

CONTRAST:  25 cc Isovue 300 administered into the gastric lumen.

ANESTHESIA/SEDATION:
Moderate (conscious) sedation was employed during this procedure. A
total of Versed 1 mg and Fentanyl 25 mcg was administered
intravenously.

Moderate Sedation Time: 12 minutes. The patient's level of
consciousness and vital signs were monitored continuously by
radiology nursing throughout the procedure under my direct
supervision.

FLUOROSCOPY TIME:  42 seconds (5 mGy)

COMPLICATIONS:
None immediate.

PROCEDURE:
Informed written consent was obtained from the patient following
explanation of the procedure, risks, benefits and alternatives. A
time out was performed prior to the initiation of the procedure.
Ultrasound scanning was performed to demarcate the edge of the left
lobe of the liver. Maximal barrier sterile technique utilized
including caps, mask, sterile gowns, sterile gloves, large sterile
drape, hand hygiene and Betadine prep.

The left upper quadrant was sterilely prepped and draped. An oral
gastric catheter was inserted into the stomach under fluoroscopy.
The existing nasogastric feeding tube was removed. The left costal
margin and air opacified transverse colon were identified and
avoided. Air was injected into the stomach for insufflation and
visualization under fluoroscopy. Under sterile conditions a 17 gauge
trocar needle was utilized to access the stomach percutaneously
beneath the left subcostal margin after the overlying soft tissues
were anesthetized with 1% Lidocaine with epinephrine. Needle
position was confirmed within the stomach with aspiration of air and
injection of small amount of contrast. A single T tack was deployed
for gastropexy. Over an Amplatz guide wire, a 9-French sheath was
inserted into the stomach. A snare device was utilized to capture
the oral gastric catheter. The snare device was pulled retrograde
from the stomach up the esophagus and out the oropharynx. The
20-French pull-through gastrostomy was connected to the snare device
and pulled antegrade through the oropharynx down the esophagus into
the stomach and then through the percutaneous tract external to the
patient. The gastrostomy was assembled externally. Contrast
injection confirms position in the stomach. Several spot
radiographic images were obtained in various obliquities for
documentation. The patient tolerated procedure well without
immediate post procedural complication.
FINDINGS: After successful fluoroscopic guided placement, the gastrostomy tube
is appropriately positioned with internal disc against the ventral
aspect of the gastric lumen.
IMPRESSION: Successful fluoroscopic insertion of a 20-French pull-through
gastrostomy tube.

The gastrostomy may be used immediately for medication
administration and in 24 hrs for the initiation of feeds.

## 2019-07-22 ENCOUNTER — Inpatient Hospital Stay: Payer: No Typology Code available for payment source | Attending: Hematology | Admitting: Hematology

## 2019-07-22 ENCOUNTER — Ambulatory Visit (INDEPENDENT_AMBULATORY_CARE_PROVIDER_SITE_OTHER): Payer: No Typology Code available for payment source | Admitting: Otolaryngology

## 2019-07-22 ENCOUNTER — Other Ambulatory Visit: Payer: Self-pay

## 2019-07-22 VITALS — BP 147/80 | HR 61 | Temp 98.0°F | Resp 18 | Ht 74.0 in | Wt 245.7 lb

## 2019-07-22 DIAGNOSIS — Z9221 Personal history of antineoplastic chemotherapy: Secondary | ICD-10-CM | POA: Diagnosis not present

## 2019-07-22 DIAGNOSIS — C099 Malignant neoplasm of tonsil, unspecified: Secondary | ICD-10-CM | POA: Diagnosis present

## 2019-07-22 DIAGNOSIS — R7989 Other specified abnormal findings of blood chemistry: Secondary | ICD-10-CM

## 2019-07-22 DIAGNOSIS — C090 Malignant neoplasm of tonsillar fossa: Secondary | ICD-10-CM

## 2019-07-22 DIAGNOSIS — Z923 Personal history of irradiation: Secondary | ICD-10-CM | POA: Insufficient documentation

## 2019-07-22 NOTE — Progress Notes (Signed)
HEMATOLOGY/ONCOLOGY CLINIC NOTE  Date of Service: 07/22/2019  Patient Care Team: Celene Squibb, MD as PCP - General (Internal Medicine) Rozetta Nunnery, MD as Consulting Physician (Otolaryngology) Eppie Gibson, MD as Attending Physician (Radiation Oncology) Brunetta Genera, MD as Consulting Physician (Hematology) Leota Sauers, RN as Oncology Nurse Navigator Karie Mainland, RD as Dietitian (Nutrition) Valentino Saxon [place], CCC-SLP as Speech Language Pathologist (Speech Pathology) Wynelle Beckmann, Melodie Bouillon, PT as Physical Therapist (Physical Therapy) Kennith Center, LCSW as Social Worker  CHIEF COMPLAINTS/PURPOSE OF CONSULTATION:  F/u for Tonsillar carcinoma   HISTORY OF PRESENTING ILLNESS:   Travis Mcdonald is a wonderful 53 y.o. 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 He is accompanied today by his wife. The pt reports that he is doing well overall.   The pt reports first noticing an enlargement of his left neck around January 2019. He believed these to be sinus related since he took sinus medications which decreased the swelling. He notes aggravating pain but denies significant pain or problems swallowing. He reports left ear and jaw pain as well.   Four teeth extraction on 01/19/18 with radiation planning this afternoon and tentative radiation beginning 02/05/18.   He denies any ear infection concerns after his audiogram. He denies recurrent ear infections but notes that he had two ear infections last year.    The pt notes that he will try to continue work each day.  He adds that he has a skin rash on his right upper leg which is recurrent in the summers. He denies seeing a dermatologist in the past and treats the associated itching with cortisone and benadryl.   He notes that he does snore at night and his wife notes that he does not snore when he sleeps on his side.   Of note prior to the patient's  visit today, pt has had PET/CT completed on 01/04/18 with results revealing Extensive hypermetabolic activity in the left neck, index lesion maximum SUV 20.3. Looking for a primary lesion in the neck, there is some very faint asymmetry of the left palatine tonsillar pillar which could conceivably represent a small lesion. 2. No significant abnormal activity in the chest, abdomen/pelvis, or skeleton. 3. Other imaging findings of potential clinical significance: Aortic Atherosclerosis (ICD10-I70.0). Coronary atherosclerosis. Small mucous retention cyst in left maxillary sinus. Diffuse hepatic steatosis. Nonspecific but probably benign hypodense lesions in the lateral segment left hepatic lobe.   Most recent lab results (01/24/18) of CBC w/diff, and CMP  is as follows: all values are WNL except glucose at 162. Magnesium 01/24/18 is WNL at 1.8 Phosphorus 01/24/18 is WNL at 2.7  On review of systems, pt reports left ear pain, left neck discomfort, left jaw pain, upper right leg rash, and denies abdominal pains, other skin rashes, and any other symptoms.   On PMHx the pt reports 1998 and 1999 back surgeries for ruptured disks with microdiscectomy, high blood pressure, denies diabetes, heart, lung or kidney problems. 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 Denies ever smoking cigarettes. Denies ETOH consumption in 30 years. Reports infrequent marijuana smoking 30 years ago.  On Family Hx the pt reports maternal cousin with brain cancer, maternal cousin with pancreatic cancer, maternal uncle with cancer. Denies much paternal knowledge.   Interval History:   Travis Mcdonald returns today regarding his tonsillar carcinoma. The patient's last visit with [place] was  on 03/20/2019. The pt reports that he is doing well overall.  The pt reports that he has been feeling good and has continued to work, although work has slowed down recently. Pt has seen Dr. Lucia Gaskins in the interim who  did not find any concerns. Pt will continue to follow-up with Dr. Lucia Gaskins at this time. He is still having some issues swallowing but has been eating well overall and gaining weight. Many of the foods he used to enjoy burn his mouth and throat now. Pt has an occasional dry cough and is around many particulates at his job, such as silica dust. He does not always wear a respiratory mask when exposed to this dust. Pt is concerned about getting a screening Colonoscopy but has no family history of Colon Cancer. Pt has had a tooth break and is interested in getting it repaired. It is not currently causing him any discomfort. Pt does have tightness in his left cervical area that is limiting his ROM slightly.   Of note since the patient's last visit, pt has had CT Neck and Chest ([id number]) ([id number]) completed on 07/04/2019 with results revealing "1. Presumed left lower lobe scarring with resolution of medial lung base opacity now with only linear, bandlike area remaining. 2. Stable 4 mm subpleural nodule in the left lower lobe. Recommend attention on follow-up. 3. Subtle subpleural reticulation and some mild peripheral ground-glass is nonspecific but unchanged, perhaps related to chronic changes and mild interstitial disease, suggest attention on follow-up. 4. Stable low-density lesions in the liver, likely small cysts. Small lesion at the periphery of the right hepatic lobe is slightly more dense than these other areas. Recommend attention on follow-up. 5. Scattered coronary artery calcifications. Aortic Atherosclerosis (ICD10-I70.0)."  Lab results (07/04/19) of CMP is as follows: all values are WNL except for Glucose at 102, AST at 11.  On review of systems, pt reports occasional cough, issues swallowing, eating well, tightness in the left cervical area and denies unexpected weight loss, abdominal pain, bowel movement issues, leg swelling and any other symptoms.   MEDICAL HISTORY:  Past Medical History:    Diagnosis Date   History of radiation therapy 02/05/18- 03/28/18   Left tonsil and neck bilaterally, 2 Gy X 35 fractions for a total dose of 70 Gy.    Hypertension     SURGICAL HISTORY: Past Surgical History:  Procedure Laterality Date   [place]   ruptured lower back disc surgery. L4 & L5 Lumber disc   IR GASTROSTOMY TUBE MOD SED  02/02/2018   IR GASTROSTOMY TUBE REMOVAL  07/27/2018   IR IMAGING GUIDED PORT INSERTION  02/02/2018   IR REMOVAL TUN ACCESS W/ PORT W/O FL MOD SED  07/27/2018    SOCIAL HISTORY: Social History   Socioeconomic History   Marital status: Married    Spouse name: Not on file   Number of children: Not on file   Years of education: Not on file   Highest education level: Not on file  Occupational History   Not on file  Social Needs   Financial resource strain: Not on file   Food insecurity    Worry: Not on file    Inability: Not on file   Transportation needs    Medical: No    Non-medical: No  Tobacco Use   Smoking status: Never Smoker   Smokeless tobacco: Never Used  Substance and Sexual Activity   Alcohol use: No    Comment: no alcohol for 30  years.    Drug use: No   Sexual activity: Not on file  Lifestyle   Physical activity    Days per week: Not on file    Minutes per session: Not on file   Stress: Not on file  Relationships   Social connections    Talks on phone: Not on file    Gets together: Not on file    Attends religious service: Not on file    Active member of club or organization: Not on file    Attends meetings of clubs or organizations: Not on file    Relationship status: Not on file   Intimate partner violence    Fear of current or ex partner: No    Emotionally abused: No    Physically abused: No    Forced sexual activity: No  Other Topics Concern   Not on file  Social History Narrative   Not on file    FAMILY HISTORY: No family history on file.  ALLERGIES:  is allergic to  other.  MEDICATIONS:  Current Outpatient Medications  Medication Sig Dispense Refill   enalapril-hydrochlorothiazide (VASERETIC) 10-25 MG tablet Take 1 tablet by mouth daily.      No current facility-administered medications for this visit.     REVIEW OF SYSTEMS:   A 10+ POINT REVIEW OF SYSTEMS WAS OBTAINED including neurology, dermatology, psychiatry, cardiac, respiratory, lymph, extremities, GI, GU, Musculoskeletal, constitutional, breasts, reproductive, HEENT.  All pertinent positives are noted in the HPI.  All others are negative.   PHYSICAL EXAMINATION: ECOG PERFORMANCE STATUS: 1 - Symptomatic but completely ambulatory  .BP (!) 147/80 (BP Location: Left Arm, Patient Position: Sitting)    Pulse 61    Temp 98 F (36.7 C) (Temporal)    Resp 18    Ht 6\' 2"  (1.88 m)    Wt 245 lb 11.2 oz (111.4 kg)    SpO2 100%    BMI 31.55 kg/m    GENERAL:alert, in no acute distress and comfortable SKIN: no acute rashes, no significant lesions EYES: conjunctiva are pink and non-injected, sclera anicteric OROPHARYNX: MMM, no exudates, no oropharyngeal erythema or ulceration NECK: supple, no JVD LYMPH:  no palpable lymphadenopathy in the cervical, axillary or inguinal regions LUNGS: clear to auscultation b/l with normal respiratory effort HEART: regular rate & rhythm ABDOMEN:  normoactive bowel sounds , non tender, not distended. No palpable hepatosplenomegaly.  Extremity: no pedal edema PSYCH: alert & oriented x 3 with fluent speech NEURO: no focal motor/sensory deficits  LABORATORY DATA:  I have reviewed the data as listed  . CBC Latest Ref Rng & Units 03/20/2019 09/17/2018 07/27/2018  WBC 4.0 - 10.5 K/uL 3.8(L) 4.1 4.0  Hemoglobin 13.0 - 17.0 g/dL 15.4 13.7 13.7  Hematocrit 39.0 - 52.0 % 46.1 40.5 42.1  Platelets 150 - 400 K/uL 172 160 160   . CBC    Component Value Date/Time   WBC 3.8 (L) 03/20/2019 1258   RBC 5.31 03/20/2019 1258   HGB 15.4 03/20/2019 1258   HGB 16.6 02/12/2018 1341     HCT 46.1 03/20/2019 1258   PLT 172 03/20/2019 1258   PLT 250 02/12/2018 1341   MCV 86.8 03/20/2019 1258   MCH 29.0 03/20/2019 1258   MCHC 33.4 03/20/2019 1258   RDW 12.4 03/20/2019 1258   LYMPHSABS 1.0 03/20/2019 1258   MONOABS 0.3 03/20/2019 1258   EOSABS 0.1 03/20/2019 1258   BASOSABS 0.0 03/20/2019 1258    . CMP Latest Ref Rng & Units 07/04/2019  03/20/2019 09/17/2018  Glucose 70 - 99 mg/dL 102(H) 88 105(H)  BUN 6 - 20 mg/dL 13 15 11   Creatinine 0.61 - 1.24 mg/dL 0.93 1.02 0.88  Sodium 135 - 145 mmol/L 142 141 142  Potassium 3.5 - 5.1 mmol/L 4.6 4.2 4.1  Chloride 98 - 111 mmol/L 104 105 107  CO2 22 - 32 mmol/L 27 26 29   Calcium 8.9 - 10.3 mg/dL 9.3 9.5 9.1  Total Protein 6.5 - 8.1 g/dL 7.0 7.0 6.6  Total Bilirubin 0.3 - 1.2 mg/dL 0.5 0.4 0.3  Alkaline Phos 38 - 126 U/L 82 82 71  AST 15 - 41 U/L 11(L) 11(L) 8(L)  ALT 0 - 44 U/L 14 11 7      11/10/17 Comprehensive Hearing Test:    12/27/17 Surgical Pathology:    RADIOGRAPHIC STUDIES: I have personally reviewed the radiological images as listed and agreed with the findings in the report. 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 Left-sided neck tightness. EXAM: CT NECK WITH CONTRAST TECHNIQUE: Multidetector CT imaging of the neck was performed using the standard protocol following the bolus administration of intravenous contrast. CONTRAST:  44mL OMNIPAQUE IOHEXOL 300 MG/ML  SOLN COMPARISON:  12/20/2017 neck CT. 07/16/2018 PET-CT. FINDINGS: Pharynx and larynx: Very mild pharyngeal edema consistent with previous radiation therapy. No mass identified. Patent airway. Salivary glands: No inflammation, mass, or stone. Thyroid: Unremarkable. Lymph nodes: Fat stranding throughout much of the left neck likely related to radiation therapy. Small residual left neck lymph nodes measure 5 mm or less in short axis. No enlarged or suspicious lymph  nodes are identified. Vascular: Major vascular structures of the neck are patent. Mild nonstenotic atherosclerotic plaque at the carotid bifurcations. Limited intracranial: Unremarkable. Visualized orbits: Unremarkable. Mastoids and visualized paranasal sinuses: Minimal mucosal thickening in the maxillary sinuses. Clear mastoid air cells. Skeleton: Spondylosis at C5-6. No suspicious osseous lesion. Upper chest: Reported separately. Other: None. IMPRESSION: Postradiation changes in the neck without evidence of recurrent disease. Electronically Signed   By: Logan Bores M.D.   On: 07/04/2019 14:43   Ct Chest W Contrast  Result Date: 07/04/2019 CLINICAL DATA:  Head neck cancer, status post definitive chemoradiotherapy. EXAM: CT CHEST WITH CONTRAST TECHNIQUE: Multidetector CT imaging of the chest was performed during intravenous contrast administration. CONTRAST:  9mL OMNIPAQUE IOHEXOL 300 MG/ML  SOLN COMPARISON:  08/17/2018 FINDINGS: Cardiovascular: Normal heart size. No pericardial effusion or nodularity. Caliber of the aorta is unremarkable. Scattered coronary artery calcifications. Central pulmonary vasculature is unremarkable. Mediastinum/Nodes: No enlarged mediastinal, hilar, or axillary lymph nodes. Thyroid gland, trachea, and esophagus demonstrate no significant findings. Lungs/Pleura: Improved appearance of process the left lung base likely mild scarring. Very subtle ground-glass opacity at the left lung base is associated with subpleural reticulation as well along the periphery of the left lower lobe and is unchanged. (Image 108, series 4) findings shows a triangular morphology and blends with subtle subpleural reticulation. Stable 4 mm subpleural nodule. Airways are patent. Upper Abdomen: Stable appearance of low-density lesions scattered about the liver likely small cysts. Small lesion at the periphery of the right hepatic lobe slightly more dense than these other areas is however also stable (image  51, series 3) approximately 8 mm. No acute findings in the upper abdomen. Musculoskeletal: No signs of acute or destructive bone process. Spinal degenerative change. IMPRESSION: 1. Presumed left lower lobe scarring with resolution of medial lung base opacity now with only  linear, bandlike area remaining. 2. Stable 4 mm subpleural nodule in the left lower lobe. Recommend attention on follow-up. 3. Subtle subpleural reticulation and some mild peripheral ground-glass is nonspecific but unchanged, perhaps related to chronic changes and mild interstitial disease, suggest attention on follow-up. 4. Stable low-density lesions in the liver, likely small cysts. Small lesion at the periphery of the right hepatic lobe is slightly more dense than these other areas. Recommend attention on follow-up. 5. Scattered coronary artery calcifications. Aortic Atherosclerosis (ICD10-I70.0). It has Electronically Signed   By: Zetta Bills M.D.   On: 07/04/2019 12:54    ASSESSMENT & PLAN:   53 y.o. 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 Malignancy cannot be excluded. Consider follow-up CT chest without contrast in 3-4 weeks in further initial evaluation, as clinically indicated. 2. Mild focal hypermetabolism anterior to the left aspect of the hyoid bone, without a definite CT correlate. 3. Tiny left renal stone.   08/17/18 CT Chest which revealed Interval decrease in overall volume of subpleural consolidation within the medial left lower lobe. Findings are likely  the sequelae of inflammatory or infectious process. Consider repeat CT of the chest in 3 months to ensure complete resolution. 2. Unchanged appearance of small subpleural nodules within the right lower lobe and left upper lobe. 3.  Aortic Atherosclerosis 4. Coronary artery atherosclerotic calcifications.  07/04/2019 CT Neck and Chest ([id number]) ([id number]) revealed "1. Presumed left lower lobe scarring with resolution of medial lung base opacity now with only linear, bandlike area remaining. 2. Stable 4 mm subpleural nodule in the left lower lobe. Recommend attention on follow-up. 3. Subtle subpleural reticulation and some mild peripheral ground-glass is nonspecific but unchanged, perhaps related to chronic changes and mild interstitial disease, suggest attention on follow-up. 4. Stable low-density lesions in the liver, likely small cysts. Small lesion at the periphery of the right hepatic lobe is slightly more dense than these other areas. Recommend attention on follow-up. 5. Scattered coronary artery calcifications. Aortic Atherosclerosis (ICD10-I70.0)."   2. Constipation - resolved  3. Radiation Mucositis - grade 2 -resolved  4. Nodule in LLL of lung- stable  PLAN:  -Discussed pt labwork, 07/04/19; all values are WNL except for Glucose at 102, AST at 11. -Discussed 07/04/2019 CT Neck and Chest ([id number]) ([id number]) shows no new concerns, some radiation changes, stable 4 mm nodule on left lower node, stable liver cysts, and some ground-glass opacity  -The pt shows no clinical or lab progression of his tonsillar carcinoma at this time.  -No indication for further treatment at this time.   -Will get labs in 1-2 weeks  -Continue monitoring TSH level  -Advised pt to wait a few months for a screening Colonoscopy, in light of negative family history and current pandemic - will revisit at next visit -Continue to f/u with Dr. Lucia Gaskins for ENT evaluation every 3 months tor/o local recurrence of  tumor. -Will see back in 6 months with labs -Pt advised to contact with any concerns or changes in symptomology   FOLLOW UP: Labs in 1-2 weeks RTC with Dr Irene Limbo with labs in 6  months    The total time spent in the appt was 25 minutes and more than 50% was on counseling and direct patient cares.  All of the patient's questions were answered with apparent satisfaction. The patient knows to call the clinic with any problems, questions or concerns.   Sullivan Lone MD [place] AAHIVMS [facility] Hematology/Oncology Physician [facility]  (Office):       [phone] (Work cell):  [phone] (Fax):           [phone]  07/22/2019 2:46 PM  I, Yevette Edwards, am acting as a scribe for Dr. Sullivan Lone.   .I have reviewed the above documentation for accuracy and completeness, and I agree with the above. Brunetta Genera MD

## 2019-07-23 ENCOUNTER — Telehealth: Payer: Self-pay | Admitting: Hematology

## 2019-07-23 NOTE — Telephone Encounter (Signed)
Scheduled appt per 1/27 los.  Spoke with pt and he is aware of the appt date and time. 

## 2019-07-29 ENCOUNTER — Telehealth: Payer: Self-pay | Admitting: Hematology

## 2019-07-29 ENCOUNTER — Inpatient Hospital Stay: Payer: No Typology Code available for payment source

## 2019-07-29 DIAGNOSIS — R7989 Other specified abnormal findings of blood chemistry: Secondary | ICD-10-CM

## 2019-07-29 DIAGNOSIS — C090 Malignant neoplasm of tonsillar fossa: Secondary | ICD-10-CM

## 2019-07-29 NOTE — Telephone Encounter (Signed)
Called pt per 12/14 sch message- unable to reach pt . Left message for patient to call back to reschedule.

## 2019-08-27 ENCOUNTER — Other Ambulatory Visit: Payer: Self-pay

## 2019-08-27 ENCOUNTER — Ambulatory Visit: Payer: No Typology Code available for payment source | Attending: Internal Medicine

## 2019-08-27 DIAGNOSIS — Z20822 Contact with and (suspected) exposure to covid-19: Secondary | ICD-10-CM

## 2019-08-29 LAB — NOVEL CORONAVIRUS, NAA: SARS-CoV-2, NAA: NOT DETECTED

## 2020-01-21 ENCOUNTER — Inpatient Hospital Stay: Payer: Managed Care, Other (non HMO) | Attending: Hematology | Admitting: Hematology

## 2020-01-21 ENCOUNTER — Other Ambulatory Visit: Payer: Self-pay

## 2020-01-21 ENCOUNTER — Inpatient Hospital Stay: Payer: Managed Care, Other (non HMO)

## 2020-01-21 VITALS — BP 132/85 | HR 64 | Temp 98.1°F | Resp 20 | Wt 241.1 lb

## 2020-01-21 DIAGNOSIS — C090 Malignant neoplasm of tonsillar fossa: Secondary | ICD-10-CM | POA: Diagnosis not present

## 2020-01-21 DIAGNOSIS — R7989 Other specified abnormal findings of blood chemistry: Secondary | ICD-10-CM

## 2020-01-21 DIAGNOSIS — J984 Other disorders of lung: Secondary | ICD-10-CM | POA: Diagnosis not present

## 2020-01-21 DIAGNOSIS — Z923 Personal history of irradiation: Secondary | ICD-10-CM | POA: Diagnosis not present

## 2020-01-21 DIAGNOSIS — R918 Other nonspecific abnormal finding of lung field: Secondary | ICD-10-CM

## 2020-01-21 DIAGNOSIS — C099 Malignant neoplasm of tonsil, unspecified: Secondary | ICD-10-CM | POA: Insufficient documentation

## 2020-01-21 DIAGNOSIS — Z9221 Personal history of antineoplastic chemotherapy: Secondary | ICD-10-CM | POA: Diagnosis not present

## 2020-01-21 LAB — CBC WITH DIFFERENTIAL/PLATELET
Abs Immature Granulocytes: 0.02 10*3/uL (ref 0.00–0.07)
Basophils Absolute: 0 10*3/uL (ref 0.0–0.1)
Basophils Relative: 0 %
Eosinophils Absolute: 0.2 10*3/uL (ref 0.0–0.5)
Eosinophils Relative: 3 %
HCT: 48.5 % (ref 39.0–52.0)
Hemoglobin: 16.3 g/dL (ref 13.0–17.0)
Immature Granulocytes: 0 %
Lymphocytes Relative: 26 %
Lymphs Abs: 1.2 10*3/uL (ref 0.7–4.0)
MCH: 28.6 pg (ref 26.0–34.0)
MCHC: 33.6 g/dL (ref 30.0–36.0)
MCV: 85.1 fL (ref 80.0–100.0)
Monocytes Absolute: 0.4 10*3/uL (ref 0.1–1.0)
Monocytes Relative: 8 %
Neutro Abs: 2.9 10*3/uL (ref 1.7–7.7)
Neutrophils Relative %: 63 %
Platelets: 171 10*3/uL (ref 150–400)
RBC: 5.7 MIL/uL (ref 4.22–5.81)
RDW: 12.9 % (ref 11.5–15.5)
WBC: 4.7 10*3/uL (ref 4.0–10.5)
nRBC: 0 % (ref 0.0–0.2)

## 2020-01-21 LAB — CMP (CANCER CENTER ONLY)
ALT: 15 U/L (ref 0–44)
AST: 13 U/L — ABNORMAL LOW (ref 15–41)
Albumin: 4.1 g/dL (ref 3.5–5.0)
Alkaline Phosphatase: 93 U/L (ref 38–126)
Anion gap: 8 (ref 5–15)
BUN: 10 mg/dL (ref 6–20)
CO2: 29 mmol/L (ref 22–32)
Calcium: 9.7 mg/dL (ref 8.9–10.3)
Chloride: 105 mmol/L (ref 98–111)
Creatinine: 0.98 mg/dL (ref 0.61–1.24)
GFR, Est AFR Am: >60 mL/min (ref >60)
GFR, Estimated: >60 mL/min (ref >60)
Glucose, Bld: 72 mg/dL (ref 70–99)
Potassium: 4.4 mmol/L (ref 3.5–5.1)
Sodium: 142 mmol/L (ref 135–145)
Total Bilirubin: 0.5 mg/dL (ref 0.3–1.2)
Total Protein: 6.9 g/dL (ref 6.5–8.1)

## 2020-01-21 LAB — TSH: TSH: 5.884 u[IU]/mL — ABNORMAL HIGH (ref 0.320–4.118)

## 2020-01-21 LAB — T4, FREE: Free T4: 0.62 ng/dL (ref 0.61–1.12)

## 2020-01-21 NOTE — Progress Notes (Signed)
HEMATOLOGY/ONCOLOGY CLINIC NOTE  Date of Service: 01/21/2020  Patient Care Team: Celene Squibb, MD as PCP - General (Internal Medicine) Rozetta Nunnery, MD as Consulting Physician (Otolaryngology) Eppie Gibson, MD as Attending Physician (Radiation Oncology) Brunetta Genera, MD as Consulting Physician (Hematology) Karie Mainland, RD as Dietitian (Nutrition) Valentino Saxon [place], CCC-SLP as Speech Language Pathologist (Speech Pathology) Wynelle Beckmann, Melodie Bouillon, PT as Physical Therapist (Physical Therapy) Kennith Center, LCSW as Social Worker  CHIEF COMPLAINTS/PURPOSE OF CONSULTATION:  F/u for Tonsillar carcinoma   HISTORY OF PRESENTING ILLNESS:   Travis Mcdonald is a wonderful 54 y.o. 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 He is accompanied today by his wife. The pt reports that he is doing well overall.   The pt reports first noticing an enlargement of his left neck around January 2019. He believed these to be sinus related since he took sinus medications which decreased the swelling. He notes aggravating pain but denies significant pain or problems swallowing. He reports left ear and jaw pain as well.   Four teeth extraction on 01/19/18 with radiation planning this afternoon and tentative radiation beginning 02/05/18.   He denies any ear infection concerns after his audiogram. He denies recurrent ear infections but notes that he had two ear infections last year.    The pt notes that he will try to continue work each day.  He adds that he has a skin rash on his right upper leg which is recurrent in the summers. He denies seeing a dermatologist in the past and treats the associated itching with cortisone and benadryl.   He notes that he does snore at night and his wife notes that he does not snore when he sleeps on his side.   Of note prior to the patient's visit today, pt has had PET/CT completed on 01/04/18  with results revealing Extensive hypermetabolic activity in the left neck, index lesion maximum SUV 20.3. Looking for a primary lesion in the neck, there is some very faint asymmetry of the left palatine tonsillar pillar which could conceivably represent a small lesion. 2. No significant abnormal activity in the chest, abdomen/pelvis, or skeleton. 3. Other imaging findings of potential clinical significance: Aortic Atherosclerosis (ICD10-I70.0). Coronary atherosclerosis. Small mucous retention cyst in left maxillary sinus. Diffuse hepatic steatosis. Nonspecific but probably benign hypodense lesions in the lateral segment left hepatic lobe.   Most recent lab results (01/24/18) of CBC w/diff, and CMP  is as follows: all values are WNL except glucose at 162. Magnesium 01/24/18 is WNL at 1.8 Phosphorus 01/24/18 is WNL at 2.7  On review of systems, pt reports left ear pain, left neck discomfort, left jaw pain, upper right leg rash, and denies abdominal pains, other skin rashes, and any other symptoms.   On PMHx the pt reports 1998 and 1999 back surgeries for ruptured disks with microdiscectomy, high blood pressure, denies diabetes, heart, lung or kidney problems. 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 Denies ever smoking cigarettes. Denies ETOH consumption in 30 years. Reports infrequent marijuana smoking 30 years ago.  On Family Hx the pt reports maternal cousin with brain cancer, maternal cousin with pancreatic cancer, maternal uncle with cancer. Denies much paternal knowledge.   Interval History:   Travis Mcdonald returns today regarding his tonsillar carcinoma. The patient's last visit with [place] was on 07/22/2019. The pt reports that he is  doing well overall.  The pt reports that he has been eating well and feeling well. He still experiences a burning sensation in is mouth when he eats certain meats or spicy food, but nothing too bothersome. Pt has stayed active  via work. Pt has had both COVID19 vaccines and tolerated them well.   Lab results today (01/21/20) of CBC w/diff and CMP is as follows: all values are WNL except for AST at 13. 01/21/2020 TSH at 5.884  On review of systems, pt denies SOB, new lumps/bumps, dysuria, bowel habit changes, difficulty swallowing, new jaw pain, abdominal pain and any other symptoms.   MEDICAL HISTORY:  Past Medical History:  Diagnosis Date  . History of radiation therapy 02/05/18- 03/28/18   Left tonsil and neck bilaterally, 2 Gy X 35 fractions for a total dose of 70 Gy.   Marland Kitchen Hypertension     SURGICAL HISTORY: Past Surgical History:  Procedure Laterality Date  . [place]   ruptured lower back disc surgery. L4 & L5 Lumber disc  . IR GASTROSTOMY TUBE MOD SED  02/02/2018  . IR GASTROSTOMY TUBE REMOVAL  07/27/2018  . IR IMAGING GUIDED PORT INSERTION  02/02/2018  . IR REMOVAL TUN ACCESS W/ PORT W/O FL MOD SED  07/27/2018    SOCIAL HISTORY: Social History   Socioeconomic History  . Marital status: Married    Spouse name: Not on file  . Number of children: Not on file  . Years of education: Not on file  . Highest education level: Not on file  Occupational History  . Not on file  Tobacco Use  . Smoking status: Never Smoker  . Smokeless tobacco: Never Used  Substance and Sexual Activity  . Alcohol use: No    Comment: no alcohol for 30 years.   . Drug use: No  . Sexual activity: Not on file  Other Topics Concern  . Not on file  Social History Narrative  . Not on file   Social Determinants of Health   Financial Resource Strain:   . Difficulty of Paying Living Expenses:   Food Insecurity:   . Worried About Charity fundraiser in the Last Year:   . Arboriculturist in the Last Year:   Transportation Needs:   . Film/video editor (Medical):   Marland Kitchen Lack of Transportation (Non-Medical):   Physical Activity:   . Days of Exercise per Week:   . Minutes of Exercise per Session:     Stress:   . Feeling of Stress :   Social Connections:   . Frequency of Communication with Friends and Family:   . Frequency of Social Gatherings with Friends and Family:   . Attends Religious Services:   . Active Member of Clubs or Organizations:   . Attends Archivist Meetings:   Marland Kitchen Marital Status:   Intimate Partner Violence:   . Fear of Current or Ex-Partner:   . Emotionally Abused:   Marland Kitchen Physically Abused:   . Sexually Abused:     FAMILY HISTORY: No family history on file.  ALLERGIES:  is allergic to other.  MEDICATIONS:  Current Outpatient Medications  Medication Sig Dispense Refill  . enalapril-hydrochlorothiazide (VASERETIC) 10-25 MG tablet Take 1 tablet by mouth daily.      No current facility-administered medications for this visit.    REVIEW OF SYSTEMS:   A 10+ POINT REVIEW OF SYSTEMS WAS OBTAINED including neurology, dermatology, psychiatry, cardiac, respiratory, lymph, extremities, GI, GU, Musculoskeletal,  constitutional, breasts, reproductive, HEENT.  All pertinent positives are noted in the HPI.  All others are negative.   PHYSICAL EXAMINATION: ECOG PERFORMANCE STATUS: 1 - Symptomatic but completely ambulatory  .BP 132/85 (BP Location: Left Arm, Patient Position: Sitting)   Pulse 64   Temp 98.1 F (36.7 C) (Temporal)   Resp 20   Wt 241 lb 1.6 oz (109.4 kg)   SpO2 100%   BMI 30.96 kg/m    GENERAL:alert, in no acute distress and comfortable SKIN: no acute rashes, no significant lesions EYES: conjunctiva are pink and non-injected, sclera anicteric OROPHARYNX: MMM, no exudates, no oropharyngeal erythema or ulceration NECK: supple, no JVD LYMPH:  no palpable lymphadenopathy in the cervical, axillary or inguinal regions LUNGS: clear to auscultation b/l with normal respiratory effort HEART: regular rate & rhythm ABDOMEN:  normoactive bowel sounds , non tender, not distended. No palpable hepatosplenomegaly.  Extremity: no pedal edema PSYCH: alert  & oriented x 3 with fluent speech NEURO: no focal motor/sensory deficits  LABORATORY DATA:  I have reviewed the data as listed  . CBC Latest Ref Rng & Units 01/21/2020 03/20/2019 09/17/2018  WBC 4.0 - 10.5 K/uL 4.7 3.8(L) 4.1  Hemoglobin 13.0 - 17.0 g/dL 16.3 15.4 13.7  Hematocrit 39.0 - 52.0 % 48.5 46.1 40.5  Platelets 150 - 400 K/uL 171 172 160   . CBC    Component Value Date/Time   WBC 4.7 01/21/2020 1126   RBC 5.70 01/21/2020 1126   HGB 16.3 01/21/2020 1126   HGB 16.6 02/12/2018 1341   HCT 48.5 01/21/2020 1126   PLT 171 01/21/2020 1126   PLT 250 02/12/2018 1341   MCV 85.1 01/21/2020 1126   MCH 28.6 01/21/2020 1126   MCHC 33.6 01/21/2020 1126   RDW 12.9 01/21/2020 1126   LYMPHSABS 1.2 01/21/2020 1126   MONOABS 0.4 01/21/2020 1126   EOSABS 0.2 01/21/2020 1126   BASOSABS 0.0 01/21/2020 1126    . CMP Latest Ref Rng & Units 01/21/2020 07/04/2019 03/20/2019  Glucose 70 - 99 mg/dL 72 102(H) 88  BUN 6 - 20 mg/dL 10 13 15   Creatinine 0.61 - 1.24 mg/dL 0.98 0.93 1.02  Sodium 135 - 145 mmol/L 142 142 141  Potassium 3.5 - 5.1 mmol/L 4.4 4.6 4.2  Chloride 98 - 111 mmol/L 105 104 105  CO2 22 - 32 mmol/L 29 27 26   Calcium 8.9 - 10.3 mg/dL 9.7 9.3 9.5  Total Protein 6.5 - 8.1 g/dL 6.9 7.0 7.0  Total Bilirubin 0.3 - 1.2 mg/dL 0.5 0.5 0.4  Alkaline Phos 38 - 126 U/L 93 82 82  AST 15 - 41 U/L 13(L) 11(L) 11(L)  ALT 0 - 44 U/L 15 14 11      11/10/17 Comprehensive Hearing Test:    12/27/17 Surgical Pathology:    RADIOGRAPHIC STUDIES: I have personally reviewed the radiological images as listed and agreed with the findings in the report. No results found.  ASSESSMENT & PLAN:   54 y.o. 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 Malignancy cannot be excluded. Consider follow-up CT chest without contrast in 3-4  weeks in further initial evaluation, as clinically indicated. 2. Mild focal hypermetabolism anterior to the left aspect of the hyoid bone, without a definite CT correlate. 3. Tiny left renal stone.   08/17/18 CT Chest which revealed Interval decrease in overall volume of subpleural consolidation within the medial left lower lobe. Findings are likely the sequelae of inflammatory or infectious process. Consider repeat CT of the chest in 3 months to ensure complete resolution. 2. Unchanged appearance of small subpleural nodules within the right lower lobe and left upper lobe. 3.  Aortic Atherosclerosis 4. Coronary artery atherosclerotic calcifications.  07/04/2019 CT Neck and Chest ([id number]) ([id number]) revealed "1. Presumed left lower lobe scarring with resolution of medial lung base opacity now with only linear, bandlike area remaining. 2. Stable 4 mm subpleural nodule in the left lower lobe. Recommend attention on follow-up. 3. Subtle subpleural reticulation and some mild peripheral ground-glass is nonspecific but unchanged, perhaps related to chronic changes and mild interstitial disease, suggest attention on follow-up. 4. Stable low-density lesions in the liver, likely small cysts. Small lesion at the periphery of the right hepatic lobe is slightly more dense than these other areas. Recommend attention on follow-up. 5. Scattered coronary artery calcifications. Aortic Atherosclerosis (ICD10-I70.0)."   2. Constipation - resolved  3. Radiation Mucositis - grade 2 -resolved  4. Nodule in LLL of lung- stable  PLAN:  -Discussed pt labwork today, 01/21/20; blood counts are nml, blood chemistries look good, TSH  is slightly elevated  -No lab or clinical evidence of recurrence of pt's Tonsillar Carcinoma at this time. Pt is nearly two years out from treatment.  -Recommend pt continue to f/u with Dr. Lucia Gaskins for ENT every 3 months to r/o local recurrence of tumor.  -Will check T4 free levels today and f/u accordingly -Will get CT Chest and labs in 22 weeks  -Will see back in 24 weeks     FOLLOW UP: -F/u with Dr Rogelia Rohrer -patient will call to set this up -CT chest in 22 weeks and labs -RTC with Dr Irene Limbo in 24 weeks   The total time spent in the appt was 20 minutes and more than 50% was on counseling and direct patient cares.  All of the patient's questions were answered with apparent satisfaction. The patient knows to call the clinic with any problems, questions or concerns.   Sullivan Lone MD [place] AAHIVMS [facility] Hematology/Oncology Physician [facility]  (Office):       [phone] (Work cell):  [phone] (Fax):           [phone]  01/21/2020 12:41 PM  I, Yevette Edwards, am acting as a scribe for Dr. Sullivan Lone.   .I have reviewed the above documentation for accuracy and completeness, and I agree with the above. Brunetta Genera MD

## 2020-01-23 ENCOUNTER — Telehealth: Payer: Self-pay | Admitting: Hematology

## 2020-01-23 NOTE — Telephone Encounter (Signed)
Scheduled per 06/08 los, patient has been called and notified. 

## 2020-01-29 ENCOUNTER — Telehealth: Payer: Self-pay | Admitting: *Deleted

## 2020-01-29 NOTE — Telephone Encounter (Signed)
-----   Message from Brunetta Genera, MD sent at 01/27/2020 10:56 PM EDT ----- Travis Mcdonald..plz let patient know his thyroid functions are borderline low. Will rpt labs in 4 months and monitor. If he start gain weight or has significant fatigue might need to start him on thyroid replacement. Will CC results to PCP to keep Dr Nevada Crane in the loop.

## 2020-01-29 NOTE — Telephone Encounter (Signed)
Contacted patient as per Dr.Kale's request. Patient verbalized understanding. Schedule message sent to schedule a lab appt in 4 months.

## 2020-04-06 ENCOUNTER — Encounter (INDEPENDENT_AMBULATORY_CARE_PROVIDER_SITE_OTHER): Payer: Self-pay | Admitting: Otolaryngology

## 2020-04-06 ENCOUNTER — Ambulatory Visit (INDEPENDENT_AMBULATORY_CARE_PROVIDER_SITE_OTHER): Payer: Managed Care, Other (non HMO) | Admitting: Otolaryngology

## 2020-04-06 ENCOUNTER — Other Ambulatory Visit: Payer: Self-pay

## 2020-04-06 VITALS — Temp 97.3°F

## 2020-04-06 DIAGNOSIS — J322 Chronic ethmoidal sinusitis: Secondary | ICD-10-CM

## 2020-04-06 DIAGNOSIS — J310 Chronic rhinitis: Secondary | ICD-10-CM | POA: Diagnosis not present

## 2020-04-06 DIAGNOSIS — Z85818 Personal history of malignant neoplasm of other sites of lip, oral cavity, and pharynx: Secondary | ICD-10-CM | POA: Diagnosis not present

## 2020-04-06 NOTE — Progress Notes (Signed)
HPI: Travis Mcdonald is a 54 y.o. male who presents is referred by Dr. Irene Limbo For evaluation of neck and sinus symptoms.  Patient states that he has had swelling on the left side of his neck.  This is worse in the mornings.  He has also had a chronic cough and throat clearing.  He states that his left neck feels tight.  He also hears some ringing in both ears but worse on the left side.  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  He apparently has done well and was released by Dr Isidore Moos but does follow-up on a regular basis with Dr Irene Limbo.  I last saw him here in May 202020 He has been doing well although he has had a chronic cough.  He is also noticed chronic postnasal drainage..  Past Medical History:  Diagnosis Date   History of radiation therapy 02/05/18- 03/28/18   Left tonsil and neck bilaterally, 2 Gy X 35 fractions for a total dose of 70 Gy.    Hypertension    Past Surgical History:  Procedure Laterality Date   [place]   ruptured lower back disc surgery. L4 & L5 Lumber disc   IR GASTROSTOMY TUBE MOD SED  02/02/2018   IR GASTROSTOMY TUBE REMOVAL  07/27/2018   IR IMAGING GUIDED PORT INSERTION  02/02/2018   IR REMOVAL TUN ACCESS W/ PORT W/O FL MOD SED  07/27/2018   Social History   Socioeconomic History   Marital status: Married    Spouse name: Not on file   Number of children: Not on file   Years of education: Not on file   Highest education level: Not on file  Occupational History   Not on file  Tobacco Use   Smoking status: Never Smoker   Smokeless tobacco: Never Used  Vaping Use   Vaping Use: Never used  Substance and Sexual Activity   Alcohol use: No    Comment: no alcohol for 30 years.    Drug use: No   Sexual activity: Not on file  Other Topics Concern   Not on file  Social History Narrative   Not on file   Social  Determinants of Health   Financial Resource Strain:    Difficulty of Paying Living Expenses: Not on file  Food Insecurity:    Worried About [place] in the Last Year: Not on file   Ran Out of Food in the Last Year: Not on file  Transportation Needs:    Lack of Transportation (Medical): Not on file   Lack of Transportation (Non-Medical): Not on file  Physical Activity:    Days of Exercise per Week: Not on file   Minutes of Exercise per Session: Not on file  Stress:    Feeling of Stress : Not on file  Social Connections:    Frequency of Communication with Friends and Family: Not on file   Frequency of Social Gatherings with Friends and Family: Not on file   Attends Religious Services: Not on file   Active Member of Clubs or Organizations: Not on file   Attends Archivist Meetings: Not on file   Marital Status: Not on file   No family history on file. Allergies  Allergen Reactions   Other Itching and Rash    Red and itchy with disposable BP cuff   Prior to Admission  medications   Medication Sig Start Date End Date Taking? Authorizing Provider  enalapril-hydrochlorothiazide (VASERETIC) 10-25 MG tablet Take 1 tablet by mouth daily.  09/25/15   [provider]     Positive ROS: Otherwise negative  All other systems have been reviewed and were otherwise negative with the exception of those mentioned in the HPI and as above.  Physical Exam: Constitutional: Alert, well-appearing, no acute distress Ears: External ears without lesions or tenderness. Ear canals are clear bilaterally with intact, clear TMs.  Nasal: External nose without lesions. Septum with minimal deformity..  Nasal endoscopy was performed in the office and on nasal endoscopy the left middle meatus region was clear in the right middle meatus region revealed a very thick white-yellow discharge. Oral: Lips and gums without lesions. Tongue and palate mucosa without lesions.  Posterior oropharynx clear.  Tonsil regions appear benign bilaterally. Fiberoptic laryngoscopy was performed to the left nostril and on fiberoptic laryngoscopy patient did have some thick supraglottic mucus.  Otherwise the base of tongue, vallecula, epiglottis and vocal cords were clear bilaterally. Neck: No palpable adenopathy or masses.  Patient's left neck is little more indurated than the right but there is no palpable masses or adenopathy noted and no clinical evidence of recurrent cancer. Respiratory: Breathing comfortably  Skin: No facial/neck lesions or rash noted.  Nasal/sinus endoscopy  Date/Time: 04/06/2020 6:37 PM Performed by: Rozetta Nunnery, MD Authorized by: Rozetta Nunnery, MD   Consent:    Consent obtained:  Verbal   Consent given by:  Patient Procedure details:    Indications: oncologic surveillance follow-up and sino-nasal symptoms     Medication:  Afrin   Instrument: flexible fiberoptic nasal endoscope     Scope location: bilateral nare   Sinus:    Right middle meatus: normal     purulence     Left middle meatus: normal   Mouth:    Oropharynx: normal     Vallecula: normal     Base of tongue: normal     Epiglottis: normal   Throat:    True vocal cords: normal   Comments:     On nasal endoscopy patient had a very thick mucoid fluid draining from the right middle meatus and clear left middle meatus.  Hypopharynx and larynx were otherwise clear with no evidence of recurrent cancer.    Assessment: History of tonsil cancer status post chemoradiation treatment 2 years ago with no evidence of recurrent cancer clinically. Chronic sinusitis  Plan: Prescribed Augmentin 875 mg twice daily for 10 days. Also recommended regular use of Nasacort 2 sprays each nostril at night and saline irrigations for the postnasal drainage. Reviewed with him that the tightness and intermittent swelling in the left neck is secondary to his chemoradiation treatment for  treatment of his cancer but there is no evidence of persistent or recurrent cancer in the neck or throat. He will follow-up in 6 months for recheck.   Radene Journey, MD   CC:

## 2020-05-28 ENCOUNTER — Inpatient Hospital Stay: Payer: Managed Care, Other (non HMO)

## 2020-06-23 ENCOUNTER — Other Ambulatory Visit: Payer: Self-pay

## 2020-06-23 ENCOUNTER — Inpatient Hospital Stay: Payer: Managed Care, Other (non HMO) | Attending: Hematology

## 2020-06-23 DIAGNOSIS — C090 Malignant neoplasm of tonsillar fossa: Secondary | ICD-10-CM | POA: Diagnosis present

## 2020-06-23 DIAGNOSIS — Z79899 Other long term (current) drug therapy: Secondary | ICD-10-CM | POA: Diagnosis not present

## 2020-06-23 DIAGNOSIS — R911 Solitary pulmonary nodule: Secondary | ICD-10-CM | POA: Diagnosis not present

## 2020-06-23 DIAGNOSIS — R918 Other nonspecific abnormal finding of lung field: Secondary | ICD-10-CM

## 2020-06-23 LAB — CBC WITH DIFFERENTIAL/PLATELET
Abs Immature Granulocytes: 0.02 10*3/uL (ref 0.00–0.07)
Basophils Absolute: 0 10*3/uL (ref 0.0–0.1)
Basophils Relative: 1 %
Eosinophils Absolute: 0.1 10*3/uL (ref 0.0–0.5)
Eosinophils Relative: 3 %
HCT: 46.9 % (ref 39.0–52.0)
Hemoglobin: 16 g/dL (ref 13.0–17.0)
Immature Granulocytes: 0 %
Lymphocytes Relative: 28 %
Lymphs Abs: 1.3 10*3/uL (ref 0.7–4.0)
MCH: 28.5 pg (ref 26.0–34.0)
MCHC: 34.1 g/dL (ref 30.0–36.0)
MCV: 83.5 fL (ref 80.0–100.0)
Monocytes Absolute: 0.4 10*3/uL (ref 0.1–1.0)
Monocytes Relative: 9 %
Neutro Abs: 2.8 10*3/uL (ref 1.7–7.7)
Neutrophils Relative %: 59 %
Platelets: 192 10*3/uL (ref 150–400)
RBC: 5.62 MIL/uL (ref 4.22–5.81)
RDW: 12.5 % (ref 11.5–15.5)
WBC: 4.7 10*3/uL (ref 4.0–10.5)
nRBC: 0 % (ref 0.0–0.2)

## 2020-06-23 LAB — CMP (CANCER CENTER ONLY)
ALT: 15 U/L (ref 0–44)
AST: 12 U/L — ABNORMAL LOW (ref 15–41)
Albumin: 4.1 g/dL (ref 3.5–5.0)
Alkaline Phosphatase: 80 U/L (ref 38–126)
Anion gap: 5 (ref 5–15)
BUN: 14 mg/dL (ref 6–20)
CO2: 30 mmol/L (ref 22–32)
Calcium: 9.5 mg/dL (ref 8.9–10.3)
Chloride: 105 mmol/L (ref 98–111)
Creatinine: 0.95 mg/dL (ref 0.61–1.24)
GFR, Estimated: >60 mL/min (ref >60)
Glucose, Bld: 94 mg/dL (ref 70–99)
Potassium: 4.6 mmol/L (ref 3.5–5.1)
Sodium: 140 mmol/L (ref 135–145)
Total Bilirubin: 0.5 mg/dL (ref 0.3–1.2)
Total Protein: 7 g/dL (ref 6.5–8.1)

## 2020-06-23 LAB — TSH: TSH: 6.175 u[IU]/mL — ABNORMAL HIGH (ref 0.320–4.118)

## 2020-06-23 LAB — T4, FREE: Free T4: 0.63 ng/dL (ref 0.61–1.12)

## 2020-06-24 ENCOUNTER — Ambulatory Visit (HOSPITAL_COMMUNITY)
Admission: RE | Admit: 2020-06-24 | Discharge: 2020-06-24 | Disposition: A | Discharge status: Home / Self Care | Payer: Managed Care, Other (non HMO) | Source: Ambulatory Visit | Attending: Hematology | Admitting: Hematology

## 2020-06-24 DIAGNOSIS — R918 Other nonspecific abnormal finding of lung field: Secondary | ICD-10-CM

## 2020-07-07 ENCOUNTER — Inpatient Hospital Stay (HOSPITAL_BASED_OUTPATIENT_CLINIC_OR_DEPARTMENT_OTHER): Payer: Managed Care, Other (non HMO) | Admitting: Hematology

## 2020-07-07 ENCOUNTER — Other Ambulatory Visit: Payer: Self-pay

## 2020-07-07 VITALS — BP 149/92 | HR 71 | Temp 97.0°F | Resp 18 | Ht 74.0 in | Wt 255.5 lb

## 2020-07-07 DIAGNOSIS — R7989 Other specified abnormal findings of blood chemistry: Secondary | ICD-10-CM | POA: Diagnosis not present

## 2020-07-07 DIAGNOSIS — C090 Malignant neoplasm of tonsillar fossa: Secondary | ICD-10-CM

## 2020-07-07 DIAGNOSIS — J984 Other disorders of lung: Secondary | ICD-10-CM

## 2020-07-07 NOTE — Progress Notes (Signed)
HEMATOLOGY/ONCOLOGY CLINIC NOTE  Date of Service: 07/07/2020  Patient Care Team: Celene Squibb, MD as PCP - General (Internal Medicine) Rozetta Nunnery, MD as Consulting Physician (Otolaryngology) Eppie Gibson, MD as Attending Physician (Radiation Oncology) Brunetta Genera, MD as Consulting Physician (Hematology) Karie Mainland, RD as Dietitian (Nutrition) Valentino Saxon [place], CCC-SLP as Speech Language Pathologist (Speech Pathology) Wynelle Beckmann, Melodie Bouillon, PT as Physical Therapist (Physical Therapy) Kennith Center, LCSW as Social Worker  CHIEF COMPLAINTS/PURPOSE OF CONSULTATION:  F/u for Tonsillar carcinoma   HISTORY OF PRESENTING ILLNESS:   Travis Mcdonald is a wonderful 54 y.o. 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 He is accompanied today by his wife. The pt reports that he is doing well overall.   The pt reports first noticing an enlargement of his left neck around January 2019. He believed these to be sinus related since he took sinus medications which decreased the swelling. He notes aggravating pain but denies significant pain or problems swallowing. He reports left ear and jaw pain as well.   Four teeth extraction on 01/19/18 with radiation planning this afternoon and tentative radiation beginning 02/05/18.   He denies any ear infection concerns after his audiogram. He denies recurrent ear infections but notes that he had two ear infections last year.    The pt notes that he will try to continue work each day.  He adds that he has a skin rash on his right upper leg which is recurrent in the summers. He denies seeing a dermatologist in the past and treats the associated itching with cortisone and benadryl.   He notes that he does snore at night and his wife notes that he does not snore when he sleeps on his side.   Of note prior to the patient's visit today, pt has had PET/CT completed on 01/04/18  with results revealing Extensive hypermetabolic activity in the left neck, index lesion maximum SUV 20.3. Looking for a primary lesion in the neck, there is some very faint asymmetry of the left palatine tonsillar pillar which could conceivably represent a small lesion. 2. No significant abnormal activity in the chest, abdomen/pelvis, or skeleton. 3. Other imaging findings of potential clinical significance: Aortic Atherosclerosis (ICD10-I70.0). Coronary atherosclerosis. Small mucous retention cyst in left maxillary sinus. Diffuse hepatic steatosis. Nonspecific but probably benign hypodense lesions in the lateral segment left hepatic lobe.   Most recent lab results (01/24/18) of CBC w/diff, and CMP  is as follows: all values are WNL except glucose at 162. Magnesium 01/24/18 is WNL at 1.8 Phosphorus 01/24/18 is WNL at 2.7  On review of systems, pt reports left ear pain, left neck discomfort, left jaw pain, upper right leg rash, and denies abdominal pains, other skin rashes, and any other symptoms.   On PMHx the pt reports 1998 and 1999 back surgeries for ruptured disks with microdiscectomy, high blood pressure, denies diabetes, heart, lung or kidney problems. 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 Denies ever smoking cigarettes. Denies ETOH consumption in 30 years. Reports infrequent marijuana smoking 30 years ago.  On Family Hx the pt reports maternal cousin with brain cancer, maternal cousin with pancreatic cancer, maternal uncle with cancer. Denies much paternal knowledge.   Interval History:  BRYLEE Mcdonald returns today regarding his tonsillar carcinoma. The patient's last visit with [place] was on 01/21/2020. The pt reports that he is doing  well overall.  The pt reports that he is eating well and is able to eat most foods. Pt denies any new, significant fatigue or weight loss. He has been seen by Dr. Lucia Gaskins, who continues to monitor him regularly. Pt discussed  his tinnitus with Dr. Lucia Gaskins. He thought this was normal. Pt has noticed occasional swelling in his left neck that is not persistent or progressive.   Of note since the patient's last visit, pt has had CT Chest ([id number]) completed on 06/24/2020 with results revealing "1. Stable exam. No new or progressive findings. 2. 12 mm ground-glass opacity in the left lung base is stable since 08/17/2018. 3. Tiny bilateral pulmonary nodules are unchanged in the interval, consistent with benign etiology."  Lab results (06/23/20) of CBC w/diff and CMP is as follows: all values are WNL except for AST at 12. 06/23/2020 TSH at 6.175 06/23/2020 Free T4 at 0.63  On review of systems, pt reports occasional left neck swelling, tinnitus and denies fatigue, unexpected weight loss, SOB and any other symptoms.   MEDICAL HISTORY:  Past Medical History:  Diagnosis Date  . History of radiation therapy 02/05/18- 03/28/18   Left tonsil and neck bilaterally, 2 Gy X 35 fractions for a total dose of 70 Gy.   Marland Kitchen Hypertension     SURGICAL HISTORY: Past Surgical History:  Procedure Laterality Date  . [place]   ruptured lower back disc surgery. L4 & L5 Lumber disc  . IR GASTROSTOMY TUBE MOD SED  02/02/2018  . IR GASTROSTOMY TUBE REMOVAL  07/27/2018  . IR IMAGING GUIDED PORT INSERTION  02/02/2018  . IR REMOVAL TUN ACCESS W/ PORT W/O FL MOD SED  07/27/2018    SOCIAL HISTORY: Social History   Socioeconomic History  . Marital status: Married    Spouse name: Not on file  . Number of children: Not on file  . Years of education: Not on file  . Highest education level: Not on file  Occupational History  . Not on file  Tobacco Use  . Smoking status: Never Smoker  . Smokeless tobacco: Never Used  Vaping Use  . Vaping Use: Never used  Substance and Sexual Activity  . Alcohol use: No    Comment: no alcohol for 30 years.   . Drug use: No  . Sexual activity: Not on file  Other Topics Concern  . Not on  file  Social History Narrative  . Not on file   Social Determinants of Health   Financial Resource Strain:   . Difficulty of Paying Living Expenses: Not on file  Food Insecurity:   . Worried About Charity fundraiser in the Last Year: Not on file  . Ran Out of Food in the Last Year: Not on file  Transportation Needs:   . Lack of Transportation (Medical): Not on file  . Lack of Transportation (Non-Medical): Not on file  Physical Activity:   . Days of Exercise per Week: Not on file  . Minutes of Exercise per Session: Not on file  Stress:   . Feeling of Stress : Not on file  Social Connections:   . Frequency of Communication with Friends and Family: Not on file  . Frequency of Social Gatherings with Friends and Family: Not on file  . Attends Religious Services: Not on file  . Active Member of Clubs or Organizations: Not on file  . Attends Archivist Meetings: Not on file  . Marital Status: Not on file  Intimate Partner Violence:   . Fear of Current or Ex-Partner: Not on file  . Emotionally Abused: Not on file  . Physically Abused: Not on file  . Sexually Abused: Not on file    FAMILY HISTORY: No family history on file.  ALLERGIES:  is allergic to other.  MEDICATIONS:  Current Outpatient Medications  Medication Sig Dispense Refill  . enalapril-hydrochlorothiazide (VASERETIC) 10-25 MG tablet Take 1 tablet by mouth daily.      No current facility-administered medications for this visit.    REVIEW OF SYSTEMS:   A 10+ POINT REVIEW OF SYSTEMS WAS OBTAINED including neurology, dermatology, psychiatry, cardiac, respiratory, lymph, extremities, GI, GU, Musculoskeletal, constitutional, breasts, reproductive, HEENT.  All pertinent positives are noted in the HPI.  All others are negative.   PHYSICAL EXAMINATION: ECOG PERFORMANCE STATUS: 1 - Symptomatic but completely ambulatory  .BP (!) 149/92   Pulse 71   Temp (!) 97 F (36.1 C) (Tympanic)   Resp 18   Ht 6\' 2"   (1.88 m)   Wt 255 lb 8 oz (115.9 kg)   SpO2 99%   BMI 32.80 kg/m    GENERAL:alert, in no acute distress and comfortable SKIN: no acute rashes, no significant lesions EYES: conjunctiva are pink and non-injected, sclera anicteric OROPHARYNX: MMM, no exudates, no oropharyngeal erythema or ulceration NECK: supple, no JVD LYMPH:  no palpable lymphadenopathy in the cervical, axillary or inguinal regions LUNGS: clear to auscultation b/l with normal respiratory effort HEART: regular rate & rhythm ABDOMEN:  normoactive bowel sounds , non tender, not distended. No palpable hepatosplenomegaly.  Extremity: no pedal edema PSYCH: alert & oriented x 3 with fluent speech NEURO: no focal motor/sensory deficits  LABORATORY DATA:  I have reviewed the data as listed  . CBC Latest Ref Rng & Units 06/23/2020 01/21/2020 03/20/2019  WBC 4.0 - 10.5 K/uL 4.7 4.7 3.8(L)  Hemoglobin 13.0 - 17.0 g/dL 16.0 16.3 15.4  Hematocrit 39 - 52 % 46.9 48.5 46.1  Platelets 150 - 400 K/uL 192 171 172   . CBC    Component Value Date/Time   WBC 4.7 06/23/2020 0944   RBC 5.62 06/23/2020 0944   HGB 16.0 06/23/2020 0944   HGB 16.6 02/12/2018 1341   HCT 46.9 06/23/2020 0944   PLT 192 06/23/2020 0944   PLT 250 02/12/2018 1341   MCV 83.5 06/23/2020 0944   MCH 28.5 06/23/2020 0944   MCHC 34.1 06/23/2020 0944   RDW 12.5 06/23/2020 0944   LYMPHSABS 1.3 06/23/2020 0944   MONOABS 0.4 06/23/2020 0944   EOSABS 0.1 06/23/2020 0944   BASOSABS 0.0 06/23/2020 0944    . CMP Latest Ref Rng & Units 06/23/2020 01/21/2020 07/04/2019  Glucose 70 - 99 mg/dL 94 72 102(H)  BUN 6 - 20 mg/dL 14 10 13   Creatinine 0.61 - 1.24 mg/dL 0.95 0.98 0.93  Sodium 135 - 145 mmol/L 140 142 142  Potassium 3.5 - 5.1 mmol/L 4.6 4.4 4.6  Chloride 98 - 111 mmol/L 105 105 104  CO2 22 - 32 mmol/L 30 29 27   Calcium 8.9 - 10.3 mg/dL 9.5 9.7 9.3  Total Protein 6.5 - 8.1 g/dL 7.0 6.9 7.0  Total Bilirubin 0.3 - 1.2 mg/dL 0.5 0.5 0.5  Alkaline Phos 38 - 126  U/L 80 93 82  AST 15 - 41 U/L 12(L) 13(L) 11(L)  ALT 0 - 44 U/L 15 15 14      11/10/17 Comprehensive Hearing Test:    12/27/17 Surgical Pathology:    RADIOGRAPHIC STUDIES:  I have personally reviewed the radiological images as listed and agreed with the findings in the report. CT Chest Wo Contrast  Result Date: 06/25/2020 CLINICAL DATA:  Pulmonary nodule on x-ray. History of head neck cancer. EXAM: CT CHEST WITHOUT CONTRAST TECHNIQUE: Multidetector CT imaging of the chest was performed following the standard protocol without IV contrast. COMPARISON:  Chest CT 07/04/2019 and 08/17/2018. FINDINGS: Cardiovascular: The heart size is normal. No substantial pericardial effusion. Coronary artery calcification is evident. No thoracic aortic aneurysm. Mediastinum/Nodes: No mediastinal lymphadenopathy. No evidence for gross hilar lymphadenopathy although assessment is limited by the lack of intravenous contrast on today's study. The esophagus has normal imaging features. There is no axillary lymphadenopathy. Lungs/Pleura: 4 mm perifissural right lower lobe nodule on 93/5 is unchanged. 4 mm perifissural nodule in the left lung on 70/5 is unchanged. Stable scarring in the medial left lower lobe. The subtle ground-glass opacity in the peripheral left lower lobe is stable measuring approximately 12 mm today (98/5). No focal airspace consolidation. No pleural effusion. Upper Abdomen: Multiple hepatic cysts again noted Musculoskeletal: No worrisome lytic or sclerotic osseous abnormality. IMPRESSION: 1. Stable exam. No new or progressive findings. 2. 12 mm ground-glass opacity in the left lung base is stable since 08/17/2018. Follow up by CT is recommended in 12 months, with continued annual surveillance for a minimum of 3 years.These recommendations are taken from:Recommendations for the Management of Subsolid Pulmonary Nodules Detected at CT: A Statement from the [facility] 2013; 266:1, 304-317. 3.  Tiny bilateral pulmonary nodules are unchanged in the interval, consistent with benign etiology. Electronically Signed   By: Misty Stanley M.D.   On: 06/25/2020 08:41    ASSESSMENT & PLAN:   54 y.o. 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 Malignancy cannot be excluded. Consider follow-up CT chest without contrast in 3-4 weeks in further initial evaluation, as clinically indicated. 2. Mild focal hypermetabolism anterior to the left aspect of the hyoid bone, without a definite CT correlate. 3. Tiny left renal stone.   08/17/18 CT Chest which revealed Interval decrease in overall volume of subpleural consolidation within the medial left lower lobe. Findings are likely the sequelae of inflammatory or infectious process. Consider repeat CT of the chest in 3 months to ensure complete resolution. 2. Unchanged appearance of small subpleural nodules within the right lower lobe and left upper lobe. 3.  Aortic Atherosclerosis 4. Coronary artery atherosclerotic calcifications.  07/04/2019 CT Neck and Chest ([id number]) ([id number]) revealed "1. Presumed left lower lobe scarring with resolution of medial lung base opacity now with only linear, bandlike area remaining. 2. Stable 4 mm subpleural nodule in the left lower lobe. Recommend attention on follow-up. 3. Subtle subpleural reticulation and some mild peripheral ground-glass is nonspecific but unchanged, perhaps related to chronic changes and mild interstitial disease, suggest attention on follow-up.  4. Stable low-density lesions in the liver, likely small cysts. Small lesion at the periphery of the right hepatic lobe is slightly more dense than these other areas. Recommend attention on follow-up. 5. Scattered coronary artery calcifications. Aortic Atherosclerosis (ICD10-I70.0)."   2. Constipation -  resolved  3. Radiation Mucositis - grade 2 -resolved  4. Nodule in LLL of lung- stable  PLAN:  -Discussed pt labwork today, 06/23/20; blood counts and chemistries are nml, TSH reflects borderline hypothyroid - will continue to monitor. -Discussed 06/24/2020 CT Chest ([id number]) which revealed "1. Stable exam. No new or progressive findings. 2. 12 mm ground-glass opacity in the left lung base is stable since 08/17/2018. 3. Tiny bilateral pulmonary nodules are unchanged in the interval, consistent with benign etiology." -No lab or clinical evidence of recurrence of pt's Tonsillar Carcinoma at this time. -Advised pt that leg swelling, weight gain, and low HR could suggest hypothyroidism. -Discussed referral to Lymphedema clinic - pt declines at this time.  -Plan to repeat CT Chest in 12 months to monitor left lobe opacity.  -Recommend pt continue to f/u with Dr. Lucia Gaskins, ENT regularly to r/o local recurrence of tumor.  -Will see back in 6 months with labs   FOLLOW UP: RTC with Dr Irene Limbo with labs in 6 months   The total time spent in the appt was 20 minutes and more than 50% was on counseling and direct patient cares.  All of the patient's questions were answered with apparent satisfaction. The patient knows to call the clinic with any problems, questions or concerns.   Sullivan Lone MD [place] AAHIVMS [facility] Hematology/Oncology Physician [facility]  (Office):       [phone] (Work cell):  [phone] (Fax):           [phone]  07/07/2020 2:58 PM  I, Yevette Edwards, am acting as a scribe for Dr. Sullivan Lone.   .I have reviewed the above documentation for accuracy  and completeness, and I agree with the above. Brunetta Genera MD

## 2020-10-05 ENCOUNTER — Ambulatory Visit (INDEPENDENT_AMBULATORY_CARE_PROVIDER_SITE_OTHER): Payer: Managed Care, Other (non HMO) | Admitting: Otolaryngology

## 2020-10-26 NOTE — Therapy (Signed)
[facility] [address] [place], [place], [address] Phone: [phone]   Fax:  [phone]  Patient Details  Name: Travis Mcdonald MRN: [id number] Date of Birth: 1966-05-26 Referring Provider:  Eppie Gibson, MD  Encounter Date: 10/26/2020  SPEECH THERAPY DISCHARGE SUMMARY  Visits from Start of Care: 2  Current functional level related to goals / functional outcomes: Jp was seen for two ST visits. His goals at last appointment were as follows: SLP Short Term Goals - 04/24/18 1005              SLP SHORT TERM GOAL #1    Title  pt will complete HEP with rare min A over two sessions     Time  2     Period  --   ST sessions    Status  On-going          SLP SHORT TERM GOAL #2    Title  pt will tell SLP why he is completing HEP over two sessions     Baseline  04-24-18     Time  2     Period  --   ST sessions    Status  On-going          SLP SHORT TERM GOAL #3    Title  pt will tell SLP how a food journal can assist with return to pt's safest diet     Time  2     Period  --   ST visits    Status  On-going              SLP Long Term Goals - 04/24/18 1006              SLP LONG TERM GOAL #1    Title  pt will complete HEP with modified independence over three sessions     Time  6     Period  --   ST sessions    Status  On-going          SLP LONG TERM GOAL #2    Title  pt will tell SLP 3 overt s/s of aspiration PNA with modified independence     Time  6     Period  --   ST visits    Status  On-going          SLP LONG TERM GOAL #3    Title  pt will tell SLP when HEP frequency can be reduced to x2-3/week     Time  4     Period  --   ST visits    Status  On-going              Plan - 04/24/18 1003     Clinical Impression Statement  Pt with oropharyngeal swallowing without overt s/s aspiration with one small sip of H2O, however the probability of swallowing difficulty increases dramatically as pt  has not complied with HEP frequency, and the completion of chemo and radiation therapy. Pt will need to cont to be followed by SLP for regular assessment of accurate HEP completion as well as for safety with POs both during and following treatment/s      Remaining deficits: Assumed pt's deficits still remain but unable to ascertain due to not seen since September 2019. Pt's latest ENT visit did not indicate any difficulty with swallowing at that time.   Education / Equipment: Late effects head/neck radiation on swallow function, HEP procedure  Plan: Patient agrees to discharge.  Patient goals were not met. Patient is being discharged due to not returning since the last visit.  ?????        Boyd ,[place], CCC-SLP  10/26/2020, 3:39 PM  [facility] [address] [place], [place], [address] Phone: [phone]   Fax:  [phone]

## 2021-01-04 NOTE — Progress Notes (Signed)
HEMATOLOGY/ONCOLOGY CLINIC NOTE  Date of Service: 01/05/2021  Patient Care Team: Celene Squibb, MD as PCP - General (Internal Medicine) Rozetta Nunnery, MD as Consulting Physician (Otolaryngology) Eppie Gibson, MD as Attending Physician (Radiation Oncology) Brunetta Genera, MD as Consulting Physician (Hematology) Karie Mainland, RD as Dietitian (Nutrition) Valentino Saxon [place], CCC-SLP as Speech Language Pathologist (Speech Pathology) Wynelle Beckmann, Melodie Bouillon, PT as Physical Therapist (Physical Therapy) Kennith Center, LCSW as Social Worker  CHIEF COMPLAINTS/PURPOSE OF CONSULTATION:  F/u for Tonsillar carcinoma   HISTORY OF PRESENTING ILLNESS:   Travis Mcdonald is a wonderful 55 y.o. 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 He is accompanied today by his wife. The pt reports that he is doing well overall.   The pt reports first noticing an enlargement of his left neck around January 2019. He believed these to be sinus related since he took sinus medications which decreased the swelling. He notes aggravating pain but denies significant pain or problems swallowing. He reports left ear and jaw pain as well.   Four teeth extraction on 01/19/18 with radiation planning this afternoon and tentative radiation beginning 02/05/18.   He denies any ear infection concerns after his audiogram. He denies recurrent ear infections but notes that he had two ear infections last year.    The pt notes that he will try to continue work each day.  He adds that he has a skin rash on his right upper leg which is recurrent in the summers. He denies seeing a dermatologist in the past and treats the associated itching with cortisone and benadryl.   He notes that he does snore at night and his wife notes that he does not snore when he sleeps on his side.   Of note prior to the patient's visit today, pt has had PET/CT completed on 01/04/18  with results revealing Extensive hypermetabolic activity in the left neck, index lesion maximum SUV 20.3. Looking for a primary lesion in the neck, there is some very faint asymmetry of the left palatine tonsillar pillar which could conceivably represent a small lesion. 2. No significant abnormal activity in the chest, abdomen/pelvis, or skeleton. 3. Other imaging findings of potential clinical significance: Aortic Atherosclerosis (ICD10-I70.0). Coronary atherosclerosis. Small mucous retention cyst in left maxillary sinus. Diffuse hepatic steatosis. Nonspecific but probably benign hypodense lesions in the lateral segment left hepatic lobe.   Most recent lab results (01/24/18) of CBC w/diff, and CMP  is as follows: all values are WNL except glucose at 162. Magnesium 01/24/18 is WNL at 1.8 Phosphorus 01/24/18 is WNL at 2.7  On review of systems, pt reports left ear pain, left neck discomfort, left jaw pain, upper right leg rash, and denies abdominal pains, other skin rashes, and any other symptoms.   On PMHx the pt reports 1998 and 1999 back surgeries for ruptured disks with microdiscectomy, high blood pressure, denies diabetes, heart, lung or kidney problems. 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 Denies ever smoking cigarettes. Denies ETOH consumption in 30 years. Reports infrequent marijuana smoking 30 years ago.  On Family Hx the pt reports maternal cousin with brain cancer, maternal cousin with pancreatic cancer, maternal uncle with cancer. Denies much paternal knowledge.   Interval History:   Travis Mcdonald returns today regarding his tonsillar carcinoma. The patient's last visit with [place] was on 07/07/2020. The pt reports that he is  doing well overall.  The pt reports that he has been gradually improving to his baseline. He has been eating well but notes some continued mouth dryness while eating. He has been gaining his weight and muscle back. He notes some  continued difficulty sleeping, but no acute worsening.  Lab results today 01/05/2021 of CBC w/diff and CMP is as follows: all values are WNL. 01/05/2021 T4, free in progress. 01/05/2021 TSH in progress.  On review of systems, pt reports weight gain, continued difficulty sleeping and denies sudden weight loss, decreased appetite, leg swelling, fevers, chills, abdominal pain, and any other symptoms.  MEDICAL HISTORY:  Past Medical History:  Diagnosis Date  . History of radiation therapy 02/05/18- 03/28/18   Left tonsil and neck bilaterally, 2 Gy X 35 fractions for a total dose of 70 Gy.   Marland Kitchen Hypertension     SURGICAL HISTORY: Past Surgical History:  Procedure Laterality Date  . [place]   ruptured lower back disc surgery. L4 & L5 Lumber disc  . IR GASTROSTOMY TUBE MOD SED  02/02/2018  . IR GASTROSTOMY TUBE REMOVAL  07/27/2018  . IR IMAGING GUIDED PORT INSERTION  02/02/2018  . IR REMOVAL TUN ACCESS W/ PORT W/O FL MOD SED  07/27/2018    SOCIAL HISTORY: Social History   Socioeconomic History  . Marital status: Married    Spouse name: Not on file  . Number of children: Not on file  . Years of education: Not on file  . Highest education level: Not on file  Occupational History  . Not on file  Tobacco Use  . Smoking status: Never Smoker  . Smokeless tobacco: Never Used  Vaping Use  . Vaping Use: Never used  Substance and Sexual Activity  . Alcohol use: No    Comment: no alcohol for 30 years.   . Drug use: No  . Sexual activity: Not on file  Other Topics Concern  . Not on file  Social History Narrative  . Not on file   Social Determinants of Health   Financial Resource Strain: Not on file  Food Insecurity: Not on file  Transportation Needs: Not on file  Physical Activity: Not on file  Stress: Not on file  Social Connections: Not on file  Intimate Partner Violence: Not on file    FAMILY HISTORY: No family history on file.  ALLERGIES:  is allergic to  other.  MEDICATIONS:  Current Outpatient Medications  Medication Sig Dispense Refill  . enalapril-hydrochlorothiazide (VASERETIC) 10-25 MG tablet Take 1 tablet by mouth daily.      No current facility-administered medications for this visit.    REVIEW OF SYSTEMS:   10 Point review of Systems was done is negative except as noted above.  PHYSICAL EXAMINATION: ECOG PERFORMANCE STATUS: 1 - Symptomatic but completely ambulatory  .BP (!) 143/75 (BP Location: Left Arm, Patient Position: Sitting)   Pulse 70   Temp 97.9 F (36.6 C) (Tympanic)   Resp 17   Ht 6\' 2"  (1.88 m)   Wt 255 lb 14.4 oz (116.1 kg)   SpO2 100%   BMI 32.86 kg/m     GENERAL:alert, in no acute distress and comfortable SKIN: no acute rashes, no significant lesions EYES: conjunctiva are pink and non-injected, sclera anicteric OROPHARYNX: MMM, no exudates, no oropharyngeal erythema or ulceration NECK: supple, no JVD LYMPH:  no palpable lymphadenopathy in the cervical, axillary or inguinal regions LUNGS: clear to auscultation b/l with normal respiratory effort HEART: regular rate &  rhythm ABDOMEN:  normoactive bowel sounds , non tender, not distended. No palpable hepatosplenomegaly.  Extremity: no pedal edema PSYCH: alert & oriented x 3 with fluent speech NEURO: no focal motor/sensory deficits  LABORATORY DATA:  I have reviewed the data as listed  . CBC Latest Ref Rng & Units 01/05/2021 06/23/2020 01/21/2020  WBC 4.0 - 10.5 K/uL 4.4 4.7 4.7  Hemoglobin 13.0 - 17.0 g/dL 16.2 16.0 16.3  Hematocrit 39.0 - 52.0 % 46.4 46.9 48.5  Platelets 150 - 400 K/uL 187 192 171   . CBC    Component Value Date/Time   WBC 4.4 01/05/2021 1114   RBC 5.63 01/05/2021 1114   HGB 16.2 01/05/2021 1114   HGB 16.6 02/12/2018 1341   HCT 46.4 01/05/2021 1114   PLT 187 01/05/2021 1114   PLT 250 02/12/2018 1341   MCV 82.4 01/05/2021 1114   MCH 28.8 01/05/2021 1114   MCHC 34.9 01/05/2021 1114   RDW 13.2 01/05/2021 1114   LYMPHSABS  1.3 01/05/2021 1114   MONOABS 0.4 01/05/2021 1114   EOSABS 0.1 01/05/2021 1114   BASOSABS 0.0 01/05/2021 1114    . CMP Latest Ref Rng & Units 01/05/2021 06/23/2020 01/21/2020  Glucose 70 - 99 mg/dL 92 94 72  BUN 6 - 20 mg/dL 10 14 10   Creatinine 0.61 - 1.24 mg/dL 0.83 0.95 0.98  Sodium 135 - 145 mmol/L 141 140 142  Potassium 3.5 - 5.1 mmol/L 4.2 4.6 4.4  Chloride 98 - 111 mmol/L 107 105 105  CO2 22 - 32 mmol/L 26 30 29   Calcium 8.9 - 10.3 mg/dL 9.3 9.5 9.7  Total Protein 6.5 - 8.1 g/dL 7.0 7.0 6.9  Total Bilirubin 0.3 - 1.2 mg/dL 0.4 0.5 0.5  Alkaline Phos 38 - 126 U/L 86 80 93  AST 15 - 41 U/L 15 12(L) 13(L)  ALT 0 - 44 U/L 23 15 15      11/10/17 Comprehensive Hearing Test:    12/27/17 Surgical Pathology:    RADIOGRAPHIC STUDIES: I have personally reviewed the radiological images as listed and agreed with the findings in the report. No results found.  ASSESSMENT & PLAN:   55 y.o. 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 Malignancy cannot be excluded. Consider follow-up CT chest without contrast in 3-4 weeks in further initial evaluation, as clinically indicated. 2. Mild focal hypermetabolism anterior to the left aspect of the hyoid bone, without a definite CT correlate. 3. Tiny left renal stone.   08/17/18 CT Chest which revealed Interval decrease in overall volume of subpleural consolidation within the medial left lower lobe. Findings are likely the sequelae of inflammatory or infectious process. Consider repeat CT of  the chest in 3 months to ensure complete resolution. 2. Unchanged appearance of small subpleural nodules within the right lower lobe and left upper lobe. 3.  Aortic Atherosclerosis 4. Coronary artery atherosclerotic calcifications.  07/04/2019 CT Neck and Chest ([id number]) ([id number]) revealed "1. Presumed left lower lobe scarring with resolution of medial lung base opacity now with only linear, bandlike area remaining. 2. Stable 4 mm subpleural nodule in the left  lower lobe. Recommend attention on follow-up. 3. Subtle subpleural reticulation and some mild peripheral ground-glass is nonspecific but unchanged, perhaps related to chronic changes and mild interstitial disease, suggest attention on follow-up. 4. Stable low-density lesions in the liver, likely small cysts. Small lesion at the periphery of the right hepatic lobe is slightly more dense than these other areas. Recommend attention on follow-up. 5. Scattered coronary artery calcifications. Aortic Atherosclerosis (ICD10-I70.0)."   2. Constipation - resolved  3. Radiation Mucositis - grade 2 -resolved  4. Nodule in LLL of lung- stable  PLAN:  -Discussed pt labwork today, 01/05/2021; blood counts and chemistries completely normal. ' - TSH elevated , free t4 low normal -- f/u with PCP in 6 months -Will get CT scan in 6 months around November.  -Advised pt that he is nearly three years out from radiation (August 2019).  -No lab or clinical evidence of recurrence of pt's Tonsillar Carcinoma at this time. -Recommend pt continue to f/u with Dr. Lucia Gaskins, ENT regularly to r/o local recurrence of tumor.  -Recommended Dr. Lucia Gaskins see pt every 4-6 months for local screenings and sooner if any symptoms. -Will get CT Chest in 6 months. -Will see back in 6 months via phone to discuss CT results.   FOLLOW UP: CT chest wo contrast in 23 weeks Phone visit in 24 weeks RTC with Dr Irene Limbo with labs in 12 months   The total time spent in the appt was 20  minutes and more than 50% was on counseling and direct patient cares.  All of the patient's questions were answered with apparent satisfaction. The patient knows to call the clinic with any problems, questions or concerns.   Sullivan Lone MD [place] AAHIVMS [facility] Hematology/Oncology Physician [facility]  (Office):       [phone] (Work cell):  [phone] (Fax):           [phone]  01/05/2021 12:20 PM  I, Reinaldo Raddle, am acting as scribe for Dr. Sullivan Lone, MD.    .I have reviewed the above documentation for accuracy and completeness, and I agree with the above. Brunetta Genera MD

## 2021-01-05 ENCOUNTER — Inpatient Hospital Stay (HOSPITAL_BASED_OUTPATIENT_CLINIC_OR_DEPARTMENT_OTHER): Payer: Managed Care, Other (non HMO) | Admitting: Hematology

## 2021-01-05 ENCOUNTER — Inpatient Hospital Stay: Payer: Managed Care, Other (non HMO) | Attending: Hematology

## 2021-01-05 ENCOUNTER — Other Ambulatory Visit: Payer: Self-pay

## 2021-01-05 VITALS — BP 143/75 | HR 70 | Temp 97.9°F | Resp 17 | Ht 74.0 in | Wt 255.9 lb

## 2021-01-05 DIAGNOSIS — C090 Malignant neoplasm of tonsillar fossa: Secondary | ICD-10-CM | POA: Diagnosis not present

## 2021-01-05 DIAGNOSIS — Z923 Personal history of irradiation: Secondary | ICD-10-CM | POA: Diagnosis not present

## 2021-01-05 DIAGNOSIS — C099 Malignant neoplasm of tonsil, unspecified: Secondary | ICD-10-CM | POA: Diagnosis present

## 2021-01-05 DIAGNOSIS — R7989 Other specified abnormal findings of blood chemistry: Secondary | ICD-10-CM

## 2021-01-05 DIAGNOSIS — R911 Solitary pulmonary nodule: Secondary | ICD-10-CM | POA: Diagnosis not present

## 2021-01-05 DIAGNOSIS — J984 Other disorders of lung: Secondary | ICD-10-CM | POA: Diagnosis not present

## 2021-01-05 LAB — CBC WITH DIFFERENTIAL/PLATELET
Abs Immature Granulocytes: 0.03 10*3/uL (ref 0.00–0.07)
Basophils Absolute: 0 10*3/uL (ref 0.0–0.1)
Basophils Relative: 1 %
Eosinophils Absolute: 0.1 10*3/uL (ref 0.0–0.5)
Eosinophils Relative: 3 %
HCT: 46.4 % (ref 39.0–52.0)
Hemoglobin: 16.2 g/dL (ref 13.0–17.0)
Immature Granulocytes: 1 %
Lymphocytes Relative: 29 %
Lymphs Abs: 1.3 10*3/uL (ref 0.7–4.0)
MCH: 28.8 pg (ref 26.0–34.0)
MCHC: 34.9 g/dL (ref 30.0–36.0)
MCV: 82.4 fL (ref 80.0–100.0)
Monocytes Absolute: 0.4 10*3/uL (ref 0.1–1.0)
Monocytes Relative: 10 %
Neutro Abs: 2.5 10*3/uL (ref 1.7–7.7)
Neutrophils Relative %: 56 %
Platelets: 187 10*3/uL (ref 150–400)
RBC: 5.63 MIL/uL (ref 4.22–5.81)
RDW: 13.2 % (ref 11.5–15.5)
WBC: 4.4 10*3/uL (ref 4.0–10.5)
nRBC: 0 % (ref 0.0–0.2)

## 2021-01-05 LAB — CMP (CANCER CENTER ONLY)
ALT: 23 U/L (ref 0–44)
AST: 15 U/L (ref 15–41)
Albumin: 4.1 g/dL (ref 3.5–5.0)
Alkaline Phosphatase: 86 U/L (ref 38–126)
Anion gap: 8 (ref 5–15)
BUN: 10 mg/dL (ref 6–20)
CO2: 26 mmol/L (ref 22–32)
Calcium: 9.3 mg/dL (ref 8.9–10.3)
Chloride: 107 mmol/L (ref 98–111)
Creatinine: 0.83 mg/dL (ref 0.61–1.24)
GFR, Estimated: >60 mL/min (ref >60)
Glucose, Bld: 92 mg/dL (ref 70–99)
Potassium: 4.2 mmol/L (ref 3.5–5.1)
Sodium: 141 mmol/L (ref 135–145)
Total Bilirubin: 0.4 mg/dL (ref 0.3–1.2)
Total Protein: 7 g/dL (ref 6.5–8.1)

## 2021-01-05 LAB — T4, FREE: Free T4: 0.62 ng/dL (ref 0.61–1.12)

## 2021-01-05 LAB — TSH: TSH: 9.543 u[IU]/mL — ABNORMAL HIGH (ref 0.320–4.118)

## 2021-01-06 ENCOUNTER — Telehealth: Payer: Self-pay | Admitting: Hematology

## 2021-01-06 NOTE — Telephone Encounter (Signed)
Scheduled follow-up appointments per 5/24 los. Patient is aware. Mailed calendar. 

## 2021-01-12 ENCOUNTER — Encounter: Payer: Self-pay | Admitting: Hematology

## 2021-06-18 ENCOUNTER — Ambulatory Visit (HOSPITAL_COMMUNITY)
Admission: RE | Admit: 2021-06-18 | Discharge: 2021-06-18 | Disposition: A | Discharge status: Home / Self Care | Payer: Managed Care, Other (non HMO) | Source: Ambulatory Visit | Attending: Hematology | Admitting: Hematology

## 2021-06-18 ENCOUNTER — Other Ambulatory Visit: Payer: Self-pay

## 2021-06-18 DIAGNOSIS — J984 Other disorders of lung: Secondary | ICD-10-CM | POA: Insufficient documentation

## 2021-06-22 ENCOUNTER — Other Ambulatory Visit: Payer: Self-pay

## 2021-06-22 ENCOUNTER — Inpatient Hospital Stay: Payer: Managed Care, Other (non HMO) | Attending: Hematology | Admitting: Hematology

## 2021-06-22 VITALS — BP 151/94 | HR 75 | Temp 98.1°F | Resp 20 | Wt 256.1 lb

## 2021-06-22 DIAGNOSIS — C099 Malignant neoplasm of tonsil, unspecified: Secondary | ICD-10-CM | POA: Diagnosis not present

## 2021-06-22 DIAGNOSIS — R911 Solitary pulmonary nodule: Secondary | ICD-10-CM | POA: Insufficient documentation

## 2021-06-22 DIAGNOSIS — C090 Malignant neoplasm of tonsillar fossa: Secondary | ICD-10-CM | POA: Diagnosis not present

## 2021-06-22 DIAGNOSIS — Z923 Personal history of irradiation: Secondary | ICD-10-CM | POA: Diagnosis not present

## 2021-06-23 ENCOUNTER — Telehealth: Payer: Self-pay | Admitting: Hematology

## 2021-06-23 NOTE — Telephone Encounter (Signed)
Scheduled follow-up appointment per 11/8 los. Patient is aware.

## 2021-06-28 ENCOUNTER — Encounter: Payer: Self-pay | Admitting: Hematology

## 2021-07-01 NOTE — Progress Notes (Signed)
HEMATOLOGY/ONCOLOGY CLINIC NOTE  Date of Service: .06/22/2021    Patient Care Team: Celene Squibb, MD as PCP - General (Internal Medicine) Rozetta Nunnery, MD as Consulting Physician (Otolaryngology) Eppie Gibson, MD as Attending Physician (Radiation Oncology) Brunetta Genera, MD as Consulting Physician (Hematology) Karie Mainland, RD as Dietitian (Nutrition) Valentino Saxon [place], CCC-SLP as Speech Language Pathologist (Speech Pathology) Wynelle Beckmann, Melodie Bouillon, PT as Physical Therapist (Physical Therapy) Kennith Center, LCSW as Social Worker  CHIEF COMPLAINTS/PURPOSE OF CONSULTATION:  F/u for Tonsillar carcinoma   HISTORY OF PRESENTING ILLNESS:   Travis Mcdonald is a wonderful 55 y.o. 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 He is accompanied today by his wife. The pt reports that he is doing well overall.   The pt reports first noticing an enlargement of his left neck around January 2019. He believed these to be sinus related since he took sinus medications which decreased the swelling. He notes aggravating pain but denies significant pain or problems swallowing. He reports left ear and jaw pain as well.   Four teeth extraction on 01/19/18 with radiation planning this afternoon and tentative radiation beginning 02/05/18.   He denies any ear infection concerns after his audiogram. He denies recurrent ear infections but notes that he had two ear infections last year.    The pt notes that he will try to continue work each day.  He adds that he has a skin rash on his right upper leg which is recurrent in the summers. He denies seeing a dermatologist in the past and treats the associated itching with cortisone and benadryl.   He notes that he does snore at night and his wife notes that he does not snore when he sleeps on his side.   Of note prior to the patient's visit today, pt has had PET/CT completed on  01/04/18 with results revealing Extensive hypermetabolic activity in the left neck, index lesion maximum SUV 20.3. Looking for a primary lesion in the neck, there is some very faint asymmetry of the left palatine tonsillar pillar which could conceivably represent a small lesion. 2. No significant abnormal activity in the chest, abdomen/pelvis, or skeleton. 3. Other imaging findings of potential clinical significance: Aortic Atherosclerosis (ICD10-I70.0). Coronary atherosclerosis. Small mucous retention cyst in left maxillary sinus. Diffuse hepatic steatosis. Nonspecific but probably benign hypodense lesions in the lateral segment left hepatic lobe.   Most recent lab results (01/24/18) of CBC w/diff, and CMP  is as follows: all values are WNL except glucose at 162. Magnesium 01/24/18 is WNL at 1.8 Phosphorus 01/24/18 is WNL at 2.7  On review of systems, pt reports left ear pain, left neck discomfort, left jaw pain, upper right leg rash, and denies abdominal pains, other skin rashes, and any other symptoms.   On PMHx the pt reports 1998 and 1999 back surgeries for ruptured disks with microdiscectomy, high blood pressure, denies diabetes, heart, lung or kidney problems. 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 Denies ever smoking cigarettes. Denies ETOH consumption in 30 years. Reports infrequent marijuana smoking 30 years ago.  On Family Hx the pt reports maternal cousin with brain cancer, maternal cousin with pancreatic cancer, maternal uncle with cancer. Denies much paternal knowledge.   Interval History:   Travis Mcdonald returns today regarding his tonsillar carcinoma. The patient's last visit with [place] was on 01/06/2020. The pt reports that  he is doing well overall.  The pt reports that he has been doing about the same.  He notes that his mouth has been more dry and this makes it somewhat more difficult to swallow.  This could certainly be from the current change  in weather conditions and he is being on and lower humidity at home. He notes that when he drinks a lot of water and moisturizes mouth and throat that the swallowing is much better. No new neck lesions.  No new chest pain or shortness of breath. Patient notes that he has not followed up with Dr. Lucia Gaskins since 04/06/2020 and was recommended to ensure he has regular follow-up with Dr. Lucia Gaskins and to call his clinic for a follow-up appointment.  Patient had repeat CT chest without contrast on 06/18/2021 which shows Stable 12 mm ground-glass opacity in the LEFT lower lobe. Stable 3 mm perifissural nodule in the RIGHT lower lobe. Compatible with benign pulmonary nodule. No acute cardiopulmonary findings.  We discussed that he does need to follow-up labs to monitor manage his elevated TSH to ensure he is not developing hypothyroidism.  Patient prefers to follow-up on this with his primary care physician.  Patient notes no other acute new symptoms.  No fevers no chills no night sweats.  No new lumps or bumps.  No new chest pain or shortness of breath. Has been continuing to stay very active at work and has been able to get through the day.  MEDICAL HISTORY:  Past Medical History:  Diagnosis Date   History of radiation therapy 02/05/18- 03/28/18   Left tonsil and neck bilaterally, 2 Gy X 35 fractions for a total dose of 70 Gy.    Hypertension     SURGICAL HISTORY: Past Surgical History:  Procedure Laterality Date   [place]   ruptured lower back disc surgery. L4 & L5 Lumber disc   IR GASTROSTOMY TUBE MOD SED  02/02/2018   IR GASTROSTOMY TUBE REMOVAL  07/27/2018   IR IMAGING GUIDED PORT INSERTION  02/02/2018   IR REMOVAL TUN ACCESS W/ PORT W/O FL MOD SED  07/27/2018    SOCIAL HISTORY: Social History   Socioeconomic History   Marital status: Married    Spouse name: Not on file   Number of children: Not on file   Years of education: Not on file   Highest education level: Not on  file  Occupational History   Not on file  Tobacco Use   Smoking status: Never   Smokeless tobacco: Never  Vaping Use   Vaping Use: Never used  Substance and Sexual Activity   Alcohol use: No    Comment: no alcohol for 30 years.    Drug use: No   Sexual activity: Not on file  Other Topics Concern   Not on file  Social History Narrative   Not on file   Social Determinants of Health   Financial Resource Strain: Not on file  Food Insecurity: Not on file  Transportation Needs: Not on file  Physical Activity: Not on file  Stress: Not on file  Social Connections: Not on file  Intimate Partner Violence: Not on file    FAMILY HISTORY: No family history on file.  ALLERGIES:  is allergic to other.  MEDICATIONS:  No current outpatient medications on file.   No current facility-administered medications for this visit.    REVIEW OF SYSTEMS:   .10 Point review of Systems was done is negative except as noted above.  PHYSICAL EXAMINATION: ECOG PERFORMANCE STATUS: 1 - Symptomatic but completely ambulatory  .BP (!) 151/94   Pulse 75   Temp 98.1 F (36.7 C)   Resp 20   Wt 256 lb 1.6 oz (116.2 kg)   SpO2 98%   BMI 32.88 kg/m    . GENERAL:alert, in no acute distress and comfortable SKIN: no acute rashes, no significant lesions EYES: conjunctiva are pink and non-injected, sclera anicteric OROPHARYNX: MMM, no exudates, no oropharyngeal erythema or ulceration NECK: supple, no JVD LYMPH:  no palpable lymphadenopathy in the cervical, axillary or inguinal regions LUNGS: clear to auscultation b/l with normal respiratory effort HEART: regular rate & rhythm ABDOMEN:  normoactive bowel sounds , non tender, not distended. Extremity: no pedal edema PSYCH: alert & oriented x 3 with fluent speech NEURO: no focal motor/sensory deficits   LABORATORY DATA:  I have reviewed the data as listed  . CBC Latest Ref Rng & Units 01/05/2021 06/23/2020 01/21/2020  WBC 4.0 - 10.5 K/uL 4.4 4.7  4.7  Hemoglobin 13.0 - 17.0 g/dL 16.2 16.0 16.3  Hematocrit 39.0 - 52.0 % 46.4 46.9 48.5  Platelets 150 - 400 K/uL 187 192 171   . CBC    Component Value Date/Time   WBC 4.4 01/05/2021 1114   RBC 5.63 01/05/2021 1114   HGB 16.2 01/05/2021 1114   HGB 16.6 02/12/2018 1341   HCT 46.4 01/05/2021 1114   PLT 187 01/05/2021 1114   PLT 250 02/12/2018 1341   MCV 82.4 01/05/2021 1114   MCH 28.8 01/05/2021 1114   MCHC 34.9 01/05/2021 1114   RDW 13.2 01/05/2021 1114   LYMPHSABS 1.3 01/05/2021 1114   MONOABS 0.4 01/05/2021 1114   EOSABS 0.1 01/05/2021 1114   BASOSABS 0.0 01/05/2021 1114    . CMP Latest Ref Rng & Units 01/05/2021 06/23/2020 01/21/2020  Glucose 70 - 99 mg/dL 92 94 72  BUN 6 - 20 mg/dL 10 14 10   Creatinine 0.61 - 1.24 mg/dL 0.83 0.95 0.98  Sodium 135 - 145 mmol/L 141 140 142  Potassium 3.5 - 5.1 mmol/L 4.2 4.6 4.4  Chloride 98 - 111 mmol/L 107 105 105  CO2 22 - 32 mmol/L 26 30 29   Calcium 8.9 - 10.3 mg/dL 9.3 9.5 9.7  Total Protein 6.5 - 8.1 g/dL 7.0 7.0 6.9  Total Bilirubin 0.3 - 1.2 mg/dL 0.4 0.5 0.5  Alkaline Phos 38 - 126 U/L 86 80 93  AST 15 - 41 U/L 15 12(L) 13(L)  ALT 0 - 44 U/L 23 15 15      11/10/17 Comprehensive Hearing Test:    12/27/17 Surgical Pathology:    RADIOGRAPHIC STUDIES: I have personally reviewed the radiological images as listed and agreed with the findings in the report. CT Chest Wo Contrast  Result Date: 06/21/2021 CLINICAL DATA:  Lung nodule in a patient with history of tonsillar carcinoma at age 89. EXAM: CT CHEST WITHOUT CONTRAST TECHNIQUE: Multidetector CT imaging of the chest was performed following the standard protocol without IV contrast. COMPARISON:  Chest CT of June 24, 2020. FINDINGS: Cardiovascular: Scattered coronary artery calcifications in LEFT coronary circulation. Normal heart size without substantial pericardial effusion. Normal caliber of the thoracic aorta. Normal caliber central pulmonary vessels. Mediastinum/Nodes:  No thoracic inlet, axillary, mediastinal or hilar adenopathy. Esophagus grossly normal. Lungs/Pleura: (Image 89/5) stable perifissural nodule, 3 mm in the RIGHT lower lobe. (Image 96/5) stable 12 mm LEFT lower lobe ground-glass opacity. Lungs are clear, airways are patent. Upper Abdomen: Incidental imaging of upper abdominal  contents with stable low-density foci in the liver, limited assessment without intravenous contrast. No acute biliary findings to the extent evaluated. Imaged portions the pancreas, spleen, adrenal glands and kidneys without acute process. Musculoskeletal: No acute bone finding. No destructive bone process. Spinal degenerative changes. IMPRESSION: Stable 12 mm ground-glass opacity in the LEFT lower lobe. Continued follow-up for a total of 5 years stability is suggested. Stable 3 mm perifissural nodule in the RIGHT lower lobe. Compatible with benign pulmonary nodule. No acute cardiopulmonary findings. Coronary artery disease. Electronically Signed   By: Zetta Bills M.D.   On: 06/21/2021 09:00    ASSESSMENT & PLAN:   55 y.o. 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 Malignancy cannot be excluded. Consider follow-up CT chest without contrast in 3-4 weeks in further initial evaluation, as clinically indicated. 2. Mild focal hypermetabolism anterior to the left aspect of the hyoid bone, without a definite CT correlate. 3. Tiny left renal stone.   08/17/18 CT Chest which revealed Interval decrease in overall volume of subpleural  consolidation within the medial left lower lobe. Findings are likely the sequelae of inflammatory or infectious process. Consider repeat CT of the chest in 3 months to ensure complete resolution. 2. Unchanged appearance of small subpleural nodules within the right lower lobe and left upper lobe. 3.  Aortic Atherosclerosis 4. Coronary artery atherosclerotic calcifications.  07/04/2019 CT Neck and Chest ([id number]) ([id number]) revealed "1. Presumed left lower lobe scarring with resolution of medial lung base opacity now with only linear, bandlike area remaining. 2. Stable 4 mm subpleural nodule in the left lower lobe. Recommend attention on follow-up. 3. Subtle subpleural reticulation and some mild peripheral ground-glass is nonspecific but unchanged, perhaps related to chronic changes and mild interstitial disease, suggest attention on follow-up. 4. Stable low-density lesions in the liver, likely small cysts. Small lesion at the periphery of the right hepatic lobe is slightly more dense than these other areas. Recommend attention on follow-up. 5. Scattered coronary artery calcifications. Aortic Atherosclerosis (ICD10-I70.0)."   2. Constipation - resolved  3. Radiation Mucositis - grade 2 -resolved  4. Nodule in LLL of lung- stable  PLAN:  -We discussed his repeat CT chest without contrast on 06/18/2021 which shows Stable 12 mm ground-glass opacity in the LEFT lower lobe. Stable 3 mm perifissural nodule in the RIGHT lower lobe. Compatible with benign pulmonary nodule. No acute cardiopulmonary findings.  We discussed that he does need to follow-up labs to monitor manage his elevated TSH to ensure he is not developing hypothyroidism.  Patient prefers to follow-up on this with his primary care physician.  -Recommend pt continue to f/u with Dr. Lucia Gaskins, ENT regularly to r/o local recurrence of tumor.  -We discussed different strategies to moisten his mouth and throat to help with swallowing that might be  bothered by dry mouth and throat.  FOLLOW UP: RTC with Dr Irene Limbo with labs in 12 months   . The total time spent in the appointment was 20 minutes and more than 50% was on counseling and direct patient cares.   All of the patient's questions were  answered with apparent satisfaction. The patient knows to call the clinic with any problems, questions or concerns.   Sullivan Lone MD [place] AAHIVMS [facility] Hematology/Oncology Physician [facility]

## 2021-12-02 ENCOUNTER — Telehealth: Payer: Self-pay | Admitting: Hematology

## 2021-12-02 NOTE — Telephone Encounter (Signed)
Per provider reschedule called and left pt message about appointment change  call back number left if changes are needed ?

## 2022-01-04 ENCOUNTER — Other Ambulatory Visit: Payer: Self-pay

## 2022-01-04 DIAGNOSIS — C090 Malignant neoplasm of tonsillar fossa: Secondary | ICD-10-CM

## 2022-01-05 ENCOUNTER — Other Ambulatory Visit: Payer: Managed Care, Other (non HMO)

## 2022-01-05 ENCOUNTER — Inpatient Hospital Stay: Payer: Managed Care, Other (non HMO) | Admitting: Hematology

## 2022-01-05 ENCOUNTER — Other Ambulatory Visit: Payer: Self-pay

## 2022-01-05 ENCOUNTER — Inpatient Hospital Stay: Payer: Managed Care, Other (non HMO) | Attending: Hematology

## 2022-01-05 VITALS — BP 152/98 | HR 64 | Temp 97.7°F | Resp 20 | Wt 255.1 lb

## 2022-01-05 DIAGNOSIS — R918 Other nonspecific abnormal finding of lung field: Secondary | ICD-10-CM | POA: Diagnosis not present

## 2022-01-05 DIAGNOSIS — Z85818 Personal history of malignant neoplasm of other sites of lip, oral cavity, and pharynx: Secondary | ICD-10-CM | POA: Insufficient documentation

## 2022-01-05 DIAGNOSIS — C090 Malignant neoplasm of tonsillar fossa: Secondary | ICD-10-CM

## 2022-01-05 DIAGNOSIS — Z79899 Other long term (current) drug therapy: Secondary | ICD-10-CM | POA: Insufficient documentation

## 2022-01-05 DIAGNOSIS — R7989 Other specified abnormal findings of blood chemistry: Secondary | ICD-10-CM

## 2022-01-05 DIAGNOSIS — J984 Other disorders of lung: Secondary | ICD-10-CM

## 2022-01-05 DIAGNOSIS — E039 Hypothyroidism, unspecified: Secondary | ICD-10-CM | POA: Insufficient documentation

## 2022-01-05 DIAGNOSIS — Z923 Personal history of irradiation: Secondary | ICD-10-CM | POA: Diagnosis not present

## 2022-01-05 LAB — CBC WITH DIFFERENTIAL (CANCER CENTER ONLY)
Abs Immature Granulocytes: 0.02 10*3/uL (ref 0.00–0.07)
Basophils Absolute: 0 10*3/uL (ref 0.0–0.1)
Basophils Relative: 1 %
Eosinophils Absolute: 0.1 10*3/uL (ref 0.0–0.5)
Eosinophils Relative: 3 %
HCT: 47.4 % (ref 39.0–52.0)
Hemoglobin: 16.6 g/dL (ref 13.0–17.0)
Immature Granulocytes: 1 %
Lymphocytes Relative: 31 %
Lymphs Abs: 1.4 10*3/uL (ref 0.7–4.0)
MCH: 29 pg (ref 26.0–34.0)
MCHC: 35 g/dL (ref 30.0–36.0)
MCV: 82.9 fL (ref 80.0–100.0)
Monocytes Absolute: 0.4 10*3/uL (ref 0.1–1.0)
Monocytes Relative: 8 %
Neutro Abs: 2.5 10*3/uL (ref 1.7–7.7)
Neutrophils Relative %: 56 %
Platelet Count: 195 10*3/uL (ref 150–400)
RBC: 5.72 MIL/uL (ref 4.22–5.81)
RDW: 13.2 % (ref 11.5–15.5)
WBC Count: 4.4 10*3/uL (ref 4.0–10.5)
nRBC: 0 % (ref 0.0–0.2)

## 2022-01-05 LAB — CMP (CANCER CENTER ONLY)
ALT: 17 U/L (ref 0–44)
AST: 13 U/L — ABNORMAL LOW (ref 15–41)
Albumin: 4.4 g/dL (ref 3.5–5.0)
Alkaline Phosphatase: 75 U/L (ref 38–126)
Anion gap: 5 (ref 5–15)
BUN: 13 mg/dL (ref 6–20)
CO2: 30 mmol/L (ref 22–32)
Calcium: 9.5 mg/dL (ref 8.9–10.3)
Chloride: 106 mmol/L (ref 98–111)
Creatinine: 0.94 mg/dL (ref 0.61–1.24)
GFR, Estimated: >60 mL/min (ref >60)
Glucose, Bld: 106 mg/dL — ABNORMAL HIGH (ref 70–99)
Potassium: 4.3 mmol/L (ref 3.5–5.1)
Sodium: 141 mmol/L (ref 135–145)
Total Bilirubin: 0.5 mg/dL (ref 0.3–1.2)
Total Protein: 7.1 g/dL (ref 6.5–8.1)

## 2022-01-05 LAB — T4, FREE: Free T4: 0.6 ng/dL — ABNORMAL LOW (ref 0.61–1.12)

## 2022-01-05 LAB — TSH: TSH: 8.648 u[IU]/mL — ABNORMAL HIGH (ref 0.350–4.500)

## 2022-01-05 NOTE — Progress Notes (Signed)
HEMATOLOGY/ONCOLOGY CLINIC NOTE  Date of Service: 01/05/2022  Patient Care Team: Celene Squibb, MD as PCP - General (Internal Medicine) Rozetta Nunnery, MD (Inactive) as Consulting Physician (Otolaryngology) Eppie Gibson, MD as Attending Physician (Radiation Oncology) Brunetta Genera, MD as Consulting Physician (Hematology) Karie Mainland, RD as Dietitian (Nutrition) Valentino Saxon [place], CCC-SLP as Speech Language Pathologist (Speech Pathology) Wynelle Beckmann, Melodie Bouillon, PT as Physical Therapist (Physical Therapy) Kennith [place], LCSW as Social Worker  CHIEF COMPLAINTS/PURPOSE OF CONSULTATION:  F/u for Tonsillar carcinoma   HISTORY OF PRESENTING ILLNESS:  Please see previous note for details on initial presentation  Interval History:  Travis Mcdonald is a 56 y.o. male returns today regarding his tonsillar carcinoma. The patient's last visit with [place] was on 06/22/2021. He reports He is doing well with no new symptoms or concerns.  He notes occasional pain and tightness in neck.  He reports some stress following his wife's recent cancer diagnosis and he notes that working in addition to taking care of her.  We discussed a potential referral for endocrinology for thyroid which he has declined at this time.  We also discussed seeing ENT for follow up which he was agreeable to.  Recommended to monitor BP at home. Suggested to take BP first thing in morning. He is advised that if systolic is persistently over 268 or diastolic persistently over 85 to to call us or inform PCP. We discussed reducing sodium intake.  No swallowing issues.  No fever, chills, night sweats. No new lumps, bumps, or lesions/rashes. No abdominal pain or change in bowel habits. No new or unexpected weight loss. No SOB or chest pain. No other new or acute focal symptoms.  Labs done today were reviewed with him in detail. CBC unremarkable. CMP unremarkable. TSH 8.648 Free T4 0.60  MEDICAL  HISTORY:  Past Medical History:  Diagnosis Date   History of radiation therapy 02/05/18- 03/28/18   Left tonsil and neck bilaterally, 2 Gy X 35 fractions for a total dose of 70 Gy.    Hypertension     SURGICAL HISTORY: Past Surgical History:  Procedure Laterality Date   [place]   ruptured lower back disc surgery. L4 & L5 Lumber disc   IR GASTROSTOMY TUBE MOD SED  02/02/2018   IR GASTROSTOMY TUBE REMOVAL  07/27/2018   IR IMAGING GUIDED PORT INSERTION  02/02/2018   IR REMOVAL TUN ACCESS W/ PORT W/O FL MOD SED  07/27/2018    SOCIAL HISTORY: Social History   Socioeconomic History   Marital status: Married    Spouse name: Not on file   Number of children: Not on file   Years of education: Not on file   Highest education level: Not on file  Occupational History   Not on file  Tobacco Use   Smoking status: Never   Smokeless tobacco: Never  Vaping Use   Vaping Use: Never used  Substance and Sexual Activity   Alcohol use: No    Comment: no alcohol for 30 years.    Drug use: No   Sexual activity: Not on file  Other Topics Concern   Not on file  Social History Narrative   Not on file   Social Determinants of Health   Financial Resource Strain: Not on file  Food Insecurity: Not on file  Transportation Needs: Not on file  Physical Activity: Not on file  Stress: Not on file  Social Connections: Not on file  Intimate Partner Violence: Not on file    FAMILY HISTORY: No family history on file.  ALLERGIES:  is allergic to other.  MEDICATIONS:  No current outpatient medications on file.   No current facility-administered medications for this visit.    REVIEW OF SYSTEMS:   .10 Point review of Systems was done is negative except as noted above.   PHYSICAL EXAMINATION: ECOG PERFORMANCE STATUS: 1 - Symptomatic but completely ambulatory  .BP (!) 152/98   Pulse 64   Temp 97.7 F (36.5 C)   Resp 20   Wt 255 lb 1.6 oz (115.7 kg)   SpO2 100%   BMI  32.75 kg/m   NAD GENERAL:alert, in no acute distress and comfortable SKIN: no acute rashes, no significant lesions EYES: conjunctiva are pink and non-injected, sclera anicteric NECK: supple, no JVD LYMPH:  no palpable lymphadenopathy in the cervical, axillary or inguinal regions LUNGS: clear to auscultation b/l with normal respiratory effort HEART: regular rate & rhythm ABDOMEN:  normoactive bowel sounds , non tender, not distended. Extremity: no pedal edema PSYCH: alert & oriented x 3 with fluent speech NEURO: no focal motor/sensory deficits  LABORATORY DATA:  I have reviewed the data as listed  .    Latest Ref Rng & Units 01/05/2021   11:14 AM 06/23/2020    9:44 AM 01/21/2020   11:26 AM  CBC  WBC 4.0 - 10.5 K/uL 4.4   4.7   4.7    Hemoglobin 13.0 - 17.0 g/dL 16.2   16.0   16.3    Hematocrit 39.0 - 52.0 % 46.4   46.9   48.5    Platelets 150 - 400 K/uL 187   192   171     . CBC    Component Value Date/Time   WBC 4.4 01/05/2021 1114   RBC 5.63 01/05/2021 1114   HGB 16.2 01/05/2021 1114   HGB 16.6 02/12/2018 1341   HCT 46.4 01/05/2021 1114   PLT 187 01/05/2021 1114   PLT 250 02/12/2018 1341   MCV 82.4 01/05/2021 1114   MCH 28.8 01/05/2021 1114   MCHC 34.9 01/05/2021 1114   RDW 13.2 01/05/2021 1114   LYMPHSABS 1.3 01/05/2021 1114   MONOABS 0.4 01/05/2021 1114   EOSABS 0.1 01/05/2021 1114   BASOSABS 0.0 01/05/2021 1114    .    Latest Ref Rng & Units 01/05/2021   11:14 AM 06/23/2020    9:44 AM 01/21/2020   11:26 AM  CMP  Glucose 70 - 99 mg/dL 92   94   72    BUN 6 - 20 mg/dL '10   14   10    '$ Creatinine 0.61 - 1.24 mg/dL 0.83   0.95   0.98    Sodium 135 - 145 mmol/L 141   140   142    Potassium 3.5 - 5.1 mmol/L 4.2   4.6   4.4    Chloride 98 - 111 mmol/L 107   105   105    CO2 22 - 32 mmol/L '26   30   29    '$ Calcium 8.9 - 10.3 mg/dL 9.3   9.5   9.7    Total Protein 6.5 - 8.1 g/dL 7.0   7.0   6.9    Total Bilirubin 0.3 - 1.2 mg/dL 0.4   0.5   0.5    Alkaline Phos 38  - 126 U/L 86   80   93    AST 15 - 41 U/L  $'15   12   13    'o$ ALT 0 - 44 U/L '23   15   15       '$ 11/10/17 Comprehensive Hearing Test:    12/27/17 Surgical Pathology:    RADIOGRAPHIC STUDIES: I have personally reviewed the radiological images as listed and agreed with the findings in the report. No results found.  ASSESSMENT & PLAN:   56 y.o. 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 Malignancy cannot be excluded. Consider follow-up CT chest without contrast in 3-4 weeks in further initial evaluation, as clinically indicated. 2. Mild focal hypermetabolism anterior to the left aspect of the hyoid bone, without a definite CT correlate. 3. Tiny left renal stone.   08/17/18 CT Chest which revealed Interval decrease in overall volume of subpleural consolidation within the medial left lower lobe. Findings are likely the sequelae of inflammatory or infectious process. Consider repeat CT of the chest in 3 months to ensure complete resolution. 2. Unchanged appearance of small subpleural nodules within the right lower lobe and left upper lobe. 3.  Aortic Atherosclerosis 4. Coronary artery atherosclerotic calcifications.  07/04/2019 CT Neck and Chest ([id number]) ([id number]) revealed "1. Presumed left lower lobe scarring with resolution of medial lung base opacity now with only linear, bandlike area remaining. 2. Stable 4 mm subpleural nodule in the left lower lobe. Recommend attention on follow-up. 3. Subtle subpleural reticulation and some mild  peripheral ground-glass is nonspecific but unchanged, perhaps related to chronic changes and mild interstitial disease, suggest attention on follow-up. 4. Stable low-density lesions in the liver, likely small cysts. Small lesion at the periphery of the right hepatic lobe is slightly more dense than these other areas. Recommend attention on follow-up. 5. Scattered coronary artery calcifications. Aortic Atherosclerosis (ICD10-I70.0)."   2. Constipation - resolved  3. Radiation Mucositis - grade 2 -resolved  4. Nodule in LLL of lung- stable  5. 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 CBC unremarkable. CMP unremarkable. TSH 8.648 Free T4 0.60 Will start on low dose levothyroxine @ 52mg po daily to address mild hypothyroidism -Recommend pt continue to f/u with ENT regularly to r/o local recurrence of tumor.  -Schedule CT scan in 11 months -RTC in 12 months with labs  FOLLOW UP: Ct chest in 11 months RTC with Dr KIrene Limbowith labs in 12 months  .The total time spent in the appointment was 20 minutes* .  All of the patient's questions were answered with apparent satisfaction. The patient knows to call the clinic with any problems, questions or concerns.   GSullivan LoneMD MS AAHIVMS SFresno [facility] And Surgical HospitalCTexas [facility] RockwallHematology/Oncology Physician [facility]Se[facility] .*Total Encounter Time as defined by the Centers for Medicare and Medicaid Services includes, in addition to the face-to-face time of a patient visit (documented in the note above) non-face-to-face time: obtaining and reviewing outside history, ordering and reviewing medications, tests or procedures, care coordination (communications with other health care professionals or caregivers) and documentation in the medical record.   I, GMelene Muller am acting as scribe for Dr. GSuzan Slick  Irene Limbo, MD. .I have reviewed the above documentation for accuracy and completeness, and I agree with the  above. Brunetta Genera MD

## 2022-01-11 ENCOUNTER — Other Ambulatory Visit: Payer: Self-pay | Admitting: Hematology

## 2022-01-11 ENCOUNTER — Encounter: Payer: Self-pay | Admitting: Hematology

## 2022-01-11 MED ORDER — LEVOTHYROXINE SODIUM 25 MCG PO TABS: 25.0000 ug | ORAL_TABLET | Freq: Every day | ORAL | 5 refills | Status: DC

## 2022-01-11 NOTE — Addendum Note (Signed)
Addended by: Sullivan Lone on: 01/11/2022 11:44 PM   Modules accepted: Orders

## 2022-01-12 ENCOUNTER — Telehealth: Payer: Self-pay

## 2022-01-12 NOTE — Telephone Encounter (Signed)
Contacted pt per Dr Irene Limbo: let patient know he has developed hypothyroidism-- Dr Irene Limbo has prescribed low dse synthyroid. Pt should f/u with PCP for rpt labs in 1-2 months and adjust thyroid replacement dose accordingly.  Pt verbalized understanding and acknowledged.

## 2022-02-22 ENCOUNTER — Telehealth: Payer: Self-pay | Admitting: Hematology

## 2022-02-22 NOTE — Telephone Encounter (Signed)
Left message with rescheduled upcoming appointment due to provider's PAL. 

## 2022-06-20 ENCOUNTER — Other Ambulatory Visit: Payer: Self-pay

## 2022-06-20 DIAGNOSIS — C090 Malignant neoplasm of tonsillar fossa: Secondary | ICD-10-CM

## 2022-06-21 ENCOUNTER — Other Ambulatory Visit: Payer: Self-pay

## 2022-06-21 ENCOUNTER — Inpatient Hospital Stay: Payer: Managed Care, Other (non HMO) | Attending: Hematology

## 2022-06-21 ENCOUNTER — Inpatient Hospital Stay: Payer: Managed Care, Other (non HMO) | Admitting: Hematology

## 2022-06-21 VITALS — BP 160/99 | HR 93 | Temp 97.9°F | Resp 19 | Ht 74.0 in | Wt 259.1 lb

## 2022-06-21 DIAGNOSIS — Z923 Personal history of irradiation: Secondary | ICD-10-CM | POA: Diagnosis not present

## 2022-06-21 DIAGNOSIS — C090 Malignant neoplasm of tonsillar fossa: Secondary | ICD-10-CM

## 2022-06-21 DIAGNOSIS — Z85818 Personal history of malignant neoplasm of other sites of lip, oral cavity, and pharynx: Secondary | ICD-10-CM | POA: Diagnosis not present

## 2022-06-21 DIAGNOSIS — E039 Hypothyroidism, unspecified: Secondary | ICD-10-CM | POA: Diagnosis not present

## 2022-06-21 DIAGNOSIS — R911 Solitary pulmonary nodule: Secondary | ICD-10-CM | POA: Insufficient documentation

## 2022-06-21 LAB — CBC WITH DIFFERENTIAL (CANCER CENTER ONLY)
Abs Immature Granulocytes: 0.02 10*3/uL (ref 0.00–0.07)
Basophils Absolute: 0 10*3/uL (ref 0.0–0.1)
Basophils Relative: 1 %
Eosinophils Absolute: 0.2 10*3/uL (ref 0.0–0.5)
Eosinophils Relative: 4 %
HCT: 45.5 % (ref 39.0–52.0)
Hemoglobin: 15.7 g/dL (ref 13.0–17.0)
Immature Granulocytes: 1 %
Lymphocytes Relative: 19 %
Lymphs Abs: 0.8 10*3/uL (ref 0.7–4.0)
MCH: 28.8 pg (ref 26.0–34.0)
MCHC: 34.5 g/dL (ref 30.0–36.0)
MCV: 83.5 fL (ref 80.0–100.0)
Monocytes Absolute: 0.4 10*3/uL (ref 0.1–1.0)
Monocytes Relative: 8 %
Neutro Abs: 3 10*3/uL (ref 1.7–7.7)
Neutrophils Relative %: 67 %
Platelet Count: 171 10*3/uL (ref 150–400)
RBC: 5.45 MIL/uL (ref 4.22–5.81)
RDW: 13.2 % (ref 11.5–15.5)
WBC Count: 4.4 10*3/uL (ref 4.0–10.5)
nRBC: 0 % (ref 0.0–0.2)

## 2022-06-21 LAB — CMP (CANCER CENTER ONLY)
ALT: 20 U/L (ref 0–44)
AST: 14 U/L — ABNORMAL LOW (ref 15–41)
Albumin: 4.1 g/dL (ref 3.5–5.0)
Alkaline Phosphatase: 74 U/L (ref 38–126)
Anion gap: 9 (ref 5–15)
BUN: 9 mg/dL (ref 6–20)
CO2: 25 mmol/L (ref 22–32)
Calcium: 9.3 mg/dL (ref 8.9–10.3)
Chloride: 105 mmol/L (ref 98–111)
Creatinine: 0.92 mg/dL (ref 0.61–1.24)
GFR, Estimated: >60 mL/min (ref >60)
Glucose, Bld: 163 mg/dL — ABNORMAL HIGH (ref 70–99)
Potassium: 3.3 mmol/L — ABNORMAL LOW (ref 3.5–5.1)
Sodium: 139 mmol/L (ref 135–145)
Total Bilirubin: 0.4 mg/dL (ref 0.3–1.2)
Total Protein: 6.7 g/dL (ref 6.5–8.1)

## 2022-06-21 LAB — TSH: TSH: 4.291 u[IU]/mL (ref 0.350–4.500)

## 2022-06-21 LAB — T4, FREE: Free T4: 0.75 ng/dL (ref 0.61–1.12)

## 2022-06-22 ENCOUNTER — Other Ambulatory Visit: Payer: Managed Care, Other (non HMO)

## 2022-06-22 ENCOUNTER — Encounter: Payer: Self-pay | Admitting: *Deleted

## 2022-06-22 ENCOUNTER — Ambulatory Visit: Payer: Managed Care, Other (non HMO) | Admitting: Hematology

## 2022-06-27 ENCOUNTER — Encounter: Payer: Self-pay | Admitting: Hematology

## 2022-06-27 NOTE — Progress Notes (Signed)
HEMATOLOGY/ONCOLOGY CLINIC NOTE  Date of Service: 06/21/2022   Patient Care Team: Celene Squibb, MD as PCP - General (Internal Medicine) Rozetta Nunnery, MD (Inactive) as Consulting Physician (Otolaryngology) Eppie Gibson, MD as Attending Physician (Radiation Oncology) Brunetta Genera, MD as Consulting Physician (Hematology) Karie Mainland, RD as Dietitian (Nutrition) Valentino Saxon [place], CCC-SLP as Speech Language Pathologist (Speech Pathology) Wynelle Beckmann, Melodie Bouillon, PT as Physical Therapist (Physical Therapy) Kennith Center, LCSW as Social Worker  CHIEF COMPLAINTS/PURPOSE OF CONSULTATION:  Follow-up for tonsillar carcinoma  HISTORY OF PRESENTING ILLNESS:  Please see previous note for details on initial presentation  Interval History:   Travis Mcdonald is a 56 y.o. male is here for follow-up with his wife for continued valuation management of his tonsillar carcinoma. He continues to have issues with dry mouth but has been eating well and notes that his weight has been stable. 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 No new lumps or bumps in the neck.  No new swallowing issues.  No new chest pain or shortness of breath.  Labs done today were discussed in detail with the patient.  MEDICAL HISTORY:  Past Medical History:  Diagnosis Date   History of radiation therapy 02/05/18- 03/28/18   Left tonsil and neck bilaterally, 2 Gy X 35 fractions for a total dose of 70 Gy.    Hypertension     SURGICAL HISTORY: Past Surgical History:  Procedure Laterality Date   [place]   ruptured lower back disc surgery. L4 & L5 Lumber disc   IR GASTROSTOMY TUBE MOD SED  02/02/2018   IR GASTROSTOMY TUBE REMOVAL  07/27/2018   IR IMAGING GUIDED PORT INSERTION  02/02/2018   IR REMOVAL TUN ACCESS W/ PORT W/O FL MOD SED  07/27/2018    SOCIAL HISTORY: Social History   Socioeconomic History   Marital status: Married     Spouse name: Not on file   Number of children: Not on file   Years of education: Not on file   Highest education level: Not on file  Occupational History   Not on file  Tobacco Use   Smoking status: Never   Smokeless tobacco: Never  Vaping Use   Vaping Use: Never used  Substance and Sexual Activity   Alcohol use: No    Comment: no alcohol for 30 years.    Drug use: No   Sexual activity: Not on file  Other Topics Concern   Not on file  Social History Narrative   Not on file   Social Determinants of Health   Financial Resource Strain: Not on file  Food Insecurity: Not on file  Transportation Needs: No Transportation Needs (07/17/2018)   PRAPARE - Hydrologist (Medical): No    Lack of Transportation (Non-Medical): No  Physical Activity: Not on file  Stress: Not on file  Social Connections: Not on file  Intimate Partner Violence: Not At Risk (04/18/2018)   Humiliation, Afraid, Rape, and Kick questionnaire    Fear of Current or Ex-Partner: No    Emotionally Abused: No    Physically Abused: No    Sexually Abused: No    FAMILY HISTORY: No family history on file.  ALLERGIES:  is allergic to other.  MEDICATIONS:  Current Outpatient Medications  Medication Sig Dispense Refill   levothyroxine (SYNTHROID) 25 MCG tablet Take 1 tablet (25 mcg total) by mouth daily before breakfast. 30 tablet  5   No current facility-administered medications for this visit.    REVIEW OF SYSTEMS:   10 Point review of Systems was done is negative except as noted above.   PHYSICAL EXAMINATION: ECOG PERFORMANCE STATUS: 1 - Symptomatic but completely ambulatory  .BP (!) 160/99 (BP Location: Left Arm, Patient Position: Sitting) Comment: nurse notified  Pulse 93   Temp 97.9 F (36.6 C) (Temporal)   Resp 19   Ht '6\' 2"'$  (1.88 m)   Wt 259 lb 1.6 oz (117.5 kg)   SpO2 98%   BMI 33.27 kg/m   NAD GENERAL:alert, in no acute distress and comfortable SKIN: no acute  rashes, no significant lesions EYES: conjunctiva are pink and non-injected, sclera anicteric OROPHARYNX: MMM, no exudates, no oropharyngeal erythema or ulceration NECK: supple, no JVD LYMPH:  no palpable lymphadenopathy in the cervical, axillary or inguinal regions LUNGS: clear to auscultation b/l with normal respiratory effort HEART: regular rate & rhythm ABDOMEN:  normoactive bowel sounds , non tender, not distended. Extremity: no pedal edema PSYCH: alert & oriented x 3 with fluent speech NEURO: no focal motor/sensory deficits    LABORATORY DATA:  I have review    ed the data as listed  .    Latest Ref Rng & Units 06/21/2022    1:40 PM 01/05/2022    9:28 AM 01/05/2021   11:14 AM  CBC  WBC 4.0 - 10.5 K/uL 4.4  4.4  4.4   Hemoglobin 13.0 - 17.0 g/dL 15.7  16.6  16.2   Hematocrit 39.0 - 52.0 % 45.5  47.4  46.4   Platelets 150 - 400 K/uL 171  195  187    . CBC    Component Value Date/Time   WBC 4.4 06/21/2022 1340   WBC 4.4 01/05/2021 1114   RBC 5.45 06/21/2022 1340   HGB 15.7 06/21/2022 1340   HCT 45.5 06/21/2022 1340   PLT 171 06/21/2022 1340   MCV 83.5 06/21/2022 1340   MCH 28.8 06/21/2022 1340   MCHC 34.5 06/21/2022 1340   RDW 13.2 06/21/2022 1340   LYMPHSABS 0.8 06/21/2022 1340   MONOABS 0.4 06/21/2022 1340   EOSABS 0.2 06/21/2022 1340   BASOSABS 0.0 06/21/2022 1340    .    Latest Ref Rng & Units 06/21/2022    1:40 PM 01/05/2022    9:28 AM 01/05/2021   11:14 AM  CMP  Glucose 70 - 99 mg/dL 163  106  92   BUN 6 - 20 mg/dL '9  13  10   '$ Creatinine 0.61 - 1.24 mg/dL 0.92  0.94  0.83   Sodium 135 - 145 mmol/L 139  141  141   Potassium 3.5 - 5.1 mmol/L 3.3  4.3  4.2   Chloride 98 - 111 mmol/L 105  106  107   CO2 22 - 32 mmol/L '25  30  26   '$ Calcium 8.9 - 10.3 mg/dL 9.3  9.5  9.3   Total Protein 6.5 - 8.1 g/dL 6.7  7.1  7.0   Total Bilirubin 0.3 - 1.2 mg/dL 0.4  0.5  0.4   Alkaline Phos 38 - 126 U/L 74  75  86   AST 15 - 41 U/L '14  13  15   '$ ALT 0 - 44 U/L '20   17  23    '$ .No results found for: "IRON", "TIBC", "IRONPCTSAT" (Iron and TIBC)  No results found for: "FERRITIN"   11/10/17 Comprehensive Hearing Test:    12/27/17 Surgical Pathology:  RADIOGRAPHIC STUDIES: I have personally reviewed the radiological images as listed and agreed with the findings in the report. No results found.  ASSESSMENT & PLAN:   56 y.o. 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 Malignancy cannot be excluded. Consider follow-up CT chest without contrast in 3-4 weeks in further initial evaluation, as clinically indicated. 2. Mild focal hypermetabolism anterior to the left aspect of the hyoid bone, without a definite CT correlate. 3. Tiny left renal stone.   08/17/18 CT Chest which revealed Interval decrease in overall volume of subpleural consolidation within the medial left lower lobe. Findings are likely the sequelae of inflammatory or infectious process. Consider repeat CT of the chest in 3 months to ensure complete resolution. 2. Unchanged appearance of small subpleural nodules within the right lower lobe and left upper lobe. 3.  Aortic Atherosclerosis 4. Coronary artery atherosclerotic calcifications.  07/04/2019 CT Neck and Chest ([id number]) ([id number]) revealed "1. Presumed left lower lobe scarring with resolution of medial lung base opacity now with only linear, bandlike area remaining. 2. Stable 4 mm subpleural nodule in the left lower lobe. Recommend attention on follow-up. 3. Subtle subpleural  reticulation and some mild peripheral ground-glass is nonspecific but unchanged, perhaps related to chronic changes and mild interstitial disease, suggest attention on follow-up. 4. Stable low-density lesions in the liver, likely small cysts. Small lesion at the periphery of the right hepatic lobe is slightly more dense than these other areas. Recommend attention on follow-up. 5. Scattered coronary artery calcifications. Aortic Atherosclerosis (ICD10-I70.0)."   2. Constipation - resolved  3. Radiation Mucositis - grade 2 -resolved  4. Nodule in LLL of lung- stable  5. 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 TSH and free T4 within normal limits.  Continue current dose of levothyroxine at 25 mcg p.o. daily Continue follow-up with ENT for local examination Patient has no lab or clinical evidence of progression/recurrence of his head and neck cancer.  Follow-up F/u as per scheduled appointments in 6 months   The total time spent in the appointment was 20 minutes*.  All of the patient's questions were answered with apparent satisfaction. The patient knows to call the clinic with any problems, questions or concerns.   Sullivan Lone MD MS AAHIVMS [facility] Hematology/Oncology Physician [facility]  .*Total Encounter Time as defined by the Centers for Medicare and Medicaid Services includes, in addition to the face-to-face time of a patient visit (documented in the note above) non-face-to-face time: obtaining and reviewing outside history, ordering and reviewing medications, tests or procedures, care coordination (communications with other health care professionals or caregivers) and documentation in the medical record.

## 2022-08-07 ENCOUNTER — Other Ambulatory Visit: Payer: Self-pay | Admitting: Hematology

## 2022-10-04 ENCOUNTER — Other Ambulatory Visit (HOSPITAL_COMMUNITY): Payer: Self-pay | Admitting: Internal Medicine

## 2022-10-04 DIAGNOSIS — E782 Mixed hyperlipidemia: Secondary | ICD-10-CM

## 2022-10-07 ENCOUNTER — Encounter: Payer: Self-pay | Admitting: Hematology

## 2022-11-02 ENCOUNTER — Telehealth: Payer: Self-pay | Admitting: Hematology

## 2022-11-02 NOTE — Telephone Encounter (Signed)
Called patient per provider PAL to reschedule 5/24 appointments. Patient rescheduled and notified.

## 2022-11-22 ENCOUNTER — Ambulatory Visit (HOSPITAL_COMMUNITY)
Admission: RE | Admit: 2022-11-22 | Discharge: 2022-11-22 | Disposition: A | Discharge status: Home / Self Care | Payer: Managed Care, Other (non HMO) | Source: Ambulatory Visit | Attending: Internal Medicine | Admitting: Internal Medicine

## 2022-11-22 DIAGNOSIS — E782 Mixed hyperlipidemia: Secondary | ICD-10-CM | POA: Insufficient documentation

## 2022-12-13 ENCOUNTER — Ambulatory Visit (HOSPITAL_COMMUNITY): Payer: Managed Care, Other (non HMO)

## 2022-12-13 ENCOUNTER — Other Ambulatory Visit: Payer: Self-pay

## 2022-12-13 DIAGNOSIS — C090 Malignant neoplasm of tonsillar fossa: Secondary | ICD-10-CM

## 2022-12-14 ENCOUNTER — Other Ambulatory Visit: Payer: Self-pay

## 2022-12-14 ENCOUNTER — Inpatient Hospital Stay (HOSPITAL_BASED_OUTPATIENT_CLINIC_OR_DEPARTMENT_OTHER): Payer: Managed Care, Other (non HMO) | Admitting: Hematology

## 2022-12-14 ENCOUNTER — Inpatient Hospital Stay: Payer: Managed Care, Other (non HMO) | Attending: Nurse Practitioner

## 2022-12-14 VITALS — BP 135/81 | HR 67 | Temp 97.8°F | Resp 18 | Wt 249.1 lb

## 2022-12-14 DIAGNOSIS — E039 Hypothyroidism, unspecified: Secondary | ICD-10-CM | POA: Diagnosis not present

## 2022-12-14 DIAGNOSIS — C090 Malignant neoplasm of tonsillar fossa: Secondary | ICD-10-CM

## 2022-12-14 DIAGNOSIS — R911 Solitary pulmonary nodule: Secondary | ICD-10-CM | POA: Insufficient documentation

## 2022-12-14 DIAGNOSIS — Z79899 Other long term (current) drug therapy: Secondary | ICD-10-CM | POA: Diagnosis not present

## 2022-12-14 DIAGNOSIS — Z85818 Personal history of malignant neoplasm of other sites of lip, oral cavity, and pharynx: Secondary | ICD-10-CM | POA: Diagnosis present

## 2022-12-14 DIAGNOSIS — Z923 Personal history of irradiation: Secondary | ICD-10-CM | POA: Diagnosis not present

## 2022-12-14 LAB — CBC WITH DIFFERENTIAL (CANCER CENTER ONLY)
Abs Immature Granulocytes: 0.03 10*3/uL (ref 0.00–0.07)
Basophils Absolute: 0.1 10*3/uL (ref 0.0–0.1)
Basophils Relative: 1 %
Eosinophils Absolute: 0.3 10*3/uL (ref 0.0–0.5)
Eosinophils Relative: 6 %
HCT: 47.2 % (ref 39.0–52.0)
Hemoglobin: 16.2 g/dL (ref 13.0–17.0)
Immature Granulocytes: 1 %
Lymphocytes Relative: 29 %
Lymphs Abs: 1.5 10*3/uL (ref 0.7–4.0)
MCH: 29 pg (ref 26.0–34.0)
MCHC: 34.3 g/dL (ref 30.0–36.0)
MCV: 84.4 fL (ref 80.0–100.0)
Monocytes Absolute: 0.4 10*3/uL (ref 0.1–1.0)
Monocytes Relative: 8 %
Neutro Abs: 2.9 10*3/uL (ref 1.7–7.7)
Neutrophils Relative %: 55 %
Platelet Count: 200 10*3/uL (ref 150–400)
RBC: 5.59 MIL/uL (ref 4.22–5.81)
RDW: 13.1 % (ref 11.5–15.5)
WBC Count: 5.1 10*3/uL (ref 4.0–10.5)
nRBC: 0 % (ref 0.0–0.2)

## 2022-12-14 LAB — CMP (CANCER CENTER ONLY)
ALT: 15 U/L (ref 0–44)
AST: 13 U/L — ABNORMAL LOW (ref 15–41)
Albumin: 4.4 g/dL (ref 3.5–5.0)
Alkaline Phosphatase: 78 U/L (ref 38–126)
Anion gap: 4 — ABNORMAL LOW (ref 5–15)
BUN: 18 mg/dL (ref 6–20)
CO2: 33 mmol/L — ABNORMAL HIGH (ref 22–32)
Calcium: 9.5 mg/dL (ref 8.9–10.3)
Chloride: 104 mmol/L (ref 98–111)
Creatinine: 1.04 mg/dL (ref 0.61–1.24)
GFR, Estimated: >60 mL/min (ref >60)
Glucose, Bld: 84 mg/dL (ref 70–99)
Potassium: 4.3 mmol/L (ref 3.5–5.1)
Sodium: 141 mmol/L (ref 135–145)
Total Bilirubin: 0.4 mg/dL (ref 0.3–1.2)
Total Protein: 7 g/dL (ref 6.5–8.1)

## 2022-12-14 LAB — TSH: TSH: 5.344 u[IU]/mL — ABNORMAL HIGH (ref 0.350–4.500)

## 2022-12-14 LAB — T4, FREE: Free T4: 0.75 ng/dL (ref 0.61–1.12)

## 2022-12-14 NOTE — Progress Notes (Signed)
HEMATOLOGY/ONCOLOGY CLINIC NOTE  Date of Service: 12/14/22   Patient Care Team: Benita Stabile, MD as PCP - General (Internal Medicine) Drema Halon, MD (Inactive) as Consulting Physician (Otolaryngology) Lonie Peak, MD as Attending Physician (Radiation Oncology) Johney Maine, MD as Consulting Physician (Hematology) Anabel Bene, RD as Dietitian (Nutrition) Sundra Aland Karie Georges, CCC-SLP as Speech Language Pathologist (Speech Pathology) Jeanella Craze, Remi Deter, PT as Physical Therapist (Physical Therapy) Ernestene Mention, LCSW as Social Worker  CHIEF COMPLAINTS/PURPOSE OF CONSULTATION:  Follow-up for tonsillar carcinoma  HISTORY OF PRESENTING ILLNESS:  Please see previous note for details on initial presentation  Interval History:   Travis Mcdonald is a 57 y.o. male is here for continued evaluation management of his tonsillar carcinoma. Patient was last seen by me on 06/21/2022 and reported dry mouth, but was otherwise doing well overall with no new symptoms,  Today, he reports that he has been feeling well overall. He reports that his thyroid medication has been increased. He notes that his weight has fluctuated approximately 10-12 pounds recently.  He denies any new swallowing issues, new throat issues, new lumps/bumps, abdominal pain, chest pain or SOB on exertion.    He does report a previous itchy rash in his upper right thigh. He reports a current dry rash on his left neck. He does report regular sun exposure when mowing the lawn.  MEDICAL HISTORY:  Past Medical History:  Diagnosis Date   History of radiation therapy 02/05/18- 03/28/18   Left tonsil and neck bilaterally, 2 Gy X 35 fractions for a total dose of 70 Gy.    Hypertension     SURGICAL HISTORY: Past Surgical History:  Procedure Laterality Date   BACK SURGERY  1998, 1999   ruptured lower back disc surgery. L4 & L5 Lumber disc   IR GASTROSTOMY TUBE MOD SED  02/02/2018   IR GASTROSTOMY TUBE  REMOVAL  07/27/2018   IR IMAGING GUIDED PORT INSERTION  02/02/2018   IR REMOVAL TUN ACCESS W/ PORT W/O FL MOD SED  07/27/2018    SOCIAL HISTORY: Social History   Socioeconomic History   Marital status: Married    Spouse name: Not on file   Number of children: Not on file   Years of education: Not on file   Highest education level: Not on file  Occupational History   Not on file  Tobacco Use   Smoking status: Never   Smokeless tobacco: Never  Vaping Use   Vaping Use: Never used  Substance and Sexual Activity   Alcohol use: No    Comment: no alcohol for 30 years.    Drug use: No   Sexual activity: Not on file  Other Topics Concern   Not on file  Social History Narrative   Not on file   Social Determinants of Health   Financial Resource Strain: Not on file  Food Insecurity: Not on file  Transportation Needs: No Transportation Needs (07/17/2018)   PRAPARE - Administrator, Civil Service (Medical): No    Lack of Transportation (Non-Medical): No  Physical Activity: Not on file  Stress: Not on file  Social Connections: Not on file  Intimate Partner Violence: Not At Risk (04/18/2018)   Humiliation, Afraid, Rape, and Kick questionnaire    Fear of Current or Ex-Partner: No    Emotionally Abused: No    Physically Abused: No    Sexually Abused: No    FAMILY HISTORY: No family history on file.  ALLERGIES:  is allergic to other.  MEDICATIONS:  Current Outpatient Medications  Medication Sig Dispense Refill   levothyroxine (SYNTHROID) 25 MCG tablet TAKE 1 TABLET(25 MCG) BY MOUTH DAILY BEFORE BREAKFAST 30 tablet 5   No current facility-administered medications for this visit.    REVIEW OF SYSTEMS:    10 Point review of Systems was done is negative except as noted above.   PHYSICAL EXAMINATION: ECOG PERFORMANCE STATUS: 1 - Symptomatic but completely ambulatory  .BP 135/81   Pulse 67   Temp 97.8 F (36.6 C)   Resp 18   Wt 249 lb 1.6 oz (113 kg)   SpO2  100%   BMI 31.98 kg/m     GENERAL:alert, in no acute distress and comfortable SKIN: no acute rashes, no significant lesions EYES: conjunctiva are pink and non-injected, sclera anicteric OROPHARYNX: MMM, no exudates, no oropharyngeal erythema or ulceration NECK: supple, no JVD LYMPH:  no palpable lymphadenopathy in the cervical, axillary or inguinal regions LUNGS: clear to auscultation b/l with normal respiratory effort HEART: regular rate & rhythm ABDOMEN:  normoactive bowel sounds , non tender, not distended. Extremity: no pedal edema PSYCH: alert & oriented x 3 with fluent speech NEURO: no focal motor/sensory deficits    LABORATORY DATA:  I have reviewed the data as listed  .    Latest Ref Rng & Units 12/14/2022    1:25 PM 06/21/2022    1:40 PM 01/05/2022    9:28 AM  CBC  WBC 4.0 - 10.5 K/uL 5.1  4.4  4.4   Hemoglobin 13.0 - 17.0 g/dL 40.9  81.1  91.4   Hematocrit 39.0 - 52.0 % 47.2  45.5  47.4   Platelets 150 - 400 K/uL 200  171  195    . CBC    Component Value Date/Time   WBC 5.1 12/14/2022 1325   WBC 4.4 01/05/2021 1114   RBC 5.59 12/14/2022 1325   HGB 16.2 12/14/2022 1325   HCT 47.2 12/14/2022 1325   PLT 200 12/14/2022 1325   MCV 84.4 12/14/2022 1325   MCH 29.0 12/14/2022 1325   MCHC 34.3 12/14/2022 1325   RDW 13.1 12/14/2022 1325   LYMPHSABS 1.5 12/14/2022 1325   MONOABS 0.4 12/14/2022 1325   EOSABS 0.3 12/14/2022 1325   BASOSABS 0.1 12/14/2022 1325    .    Latest Ref Rng & Units 12/14/2022    1:25 PM 06/21/2022    1:40 PM 01/05/2022    9:28 AM  CMP  Glucose 70 - 99 mg/dL 84  782  956   BUN 6 - 20 mg/dL 18  9  13    Creatinine 0.61 - 1.24 mg/dL 2.13  0.86  5.78   Sodium 135 - 145 mmol/L 141  139  141   Potassium 3.5 - 5.1 mmol/L 4.3  3.3  4.3   Chloride 98 - 111 mmol/L 104  105  106   CO2 22 - 32 mmol/L 33  25  30   Calcium 8.9 - 10.3 mg/dL 9.5  9.3  9.5   Total Protein 6.5 - 8.1 g/dL 7.0  6.7  7.1   Total Bilirubin 0.3 - 1.2 mg/dL 0.4  0.4  0.5    Alkaline Phos 38 - 126 U/L 78  74  75   AST 15 - 41 U/L 13  14  13    ALT 0 - 44 U/L 15  20  17     .No results found for: "IRON", "TIBC", "IRONPCTSAT" (Iron and TIBC)  No results found for: "FERRITIN"  12/12/2022 CT Chest:    11/10/17 Comprehensive Hearing Test:    12/27/17 Surgical Pathology:    RADIOGRAPHIC STUDIES: I have personally reviewed the radiological images as listed and agreed with the findings in the report. CT CARDIAC SCORING (SELF PAY ONLY)  Addendum Date: 11/23/2022   ADDENDUM REPORT: 11/23/2022 01:22 EXAM: OVER-READ INTERPRETATION  CT CHEST The following report is an over-read performed by radiologist Dr. Alcide Clever of [facility] on 11/23/2022. This over-read does not include interpretation of cardiac or coronary anatomy or pathology. The coronary calcium score/coronary CTA interpretation by the cardiologist is attached. COMPARISON:  06/18/2021 FINDINGS: Cardiovascular: Dilatation of the ascending aorta to 4 cm is noted. No significant atherosclerotic calcifications are noted. Mediastinum/Nodes: There are no enlarged lymph nodes within the visualized mediastinum. Lungs/Pleura: There is no pleural effusion. The visualized lungs appear clear. Upper abdomen: No significant findings in the visualized upper abdomen. Musculoskeletal/Chest wall: No chest wall mass or suspicious osseous findings within the visualized chest. 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 Recommend annual imaging followup by CTA or MRA. This recommendation follows 2010 ACCF/AHA/AATS/ACR/ASA/SCA/SCAI/SIR/STS/SVM Guidelines for the Diagnosis and Management of Patients with Thoracic Aortic Disease. Circulation. 2010; 121: Z610-R604. Aortic aneurysm NOS (ICD10-I71.9) Electronically Signed   By: Alcide Clever M.D.   On: 11/23/2022 01:22   Result Date: 11/23/2022 CLINICAL DATA:  Risk stratification: 57 Year-old Male EXAM: Coronary Calcium Score TECHNIQUE: The patient was scanned on a  [employer]. Axial non-contrast 3 mm slices were carried out through the heart. The data set was analyzed on a dedicated work station and scored using the Agatson method. FINDINGS: Non-cardiac: See separate report from [facility]. Ascending Aorta: Aortic atherosclerosis. Evidence of mild ascending aortic dilation, 40 mm, on non-contrasted study. Aortic Valve Calcium score: 25 Mitral annular calcification: None Pericardium: Normal. Coronary arteries: Normal origins. Coronary Calcium Score: Left main: 56 Left anterior descending artery: 186 Left circumflex artery: 0 Right coronary artery: 14 Total: 256 Percentile: 88th for age, sex, and race matched control. IMPRESSION: 1. Coronary calcium score of 256. This was 88th percentile for age, gender, and race matched controls. 2. Aortic atherosclerosis. 3. Evidence of mild ascending aortic dilation, 40 mm, on non-contrasted study. Consider secondary imaging modality (echocardiogram, CTA Aorta Protocol, MRA Aorta Protocol) if clinically indicated. RECOMMENDATIONS: The proposed cut-off value of 1,651 AU yielded a 93 % sensitivity and 75 % specificity in grading AS severity in patients with classical low-flow, low-gradient AS. Proposed different cut-off values to define severe AS for men and women as 2,065 AU and 1,274 AU, respectively. The joint European and American recommendations for the assessment of AS consider the aortic valve calcium score as a continuum - a very high calcium score suggests severe AS and a low calcium score suggests severe AS is unlikely. Sunday Shams, et al. 2017 ESC/EACTS Guidelines for the management of valvular heart disease. Eur Heart J 2017;38:2739-91. Coronary artery calcium (CAC) score is a strong predictor of incident coronary heart disease (CHD) and provides predictive information beyond traditional risk factors. CAC scoring is reasonable to use in the decision to withhold, postpone, or initiate statin  therapy in intermediate-risk or selected borderline-risk asymptomatic adults (age 23-75 years and LDL-C >=70 to <190 mg/dL) who do not have diabetes or established atherosclerotic cardiovascular disease (ASCVD).* In intermediate-risk (10-year ASCVD risk >=7.5% to <20%) adults or selected borderline-risk (10-year ASCVD risk >=5% to <7.5%) adults in whom a CAC score  is measured for the purpose of making a treatment decision the following recommendations have been made: If CAC = 0, it is reasonable to withhold statin therapy and reassess in 5 to 10 years, as long as higher risk conditions are absent (diabetes mellitus, family history of premature CHD in first degree relatives (males <55 years; females <65 years), cigarette smoking, LDL >=190 mg/dL or other independent risk factors). If CAC is 1 to 99, it is reasonable to initiate statin therapy for patients >=62 years of age. If CAC is >=100 or >=75th percentile, it is reasonable to initiate statin therapy at any age. Cardiology referral should be considered for patients with CAC scores =400 or >=75th percentile. *2018 AHA/ACC/AACVPR/AAPA/ABC/ACPM/ADA/AGS/APhA/ASPC/NLA/PCNA Guideline on the Management of Blood Cholesterol: A Report of the American College of Cardiology/American Heart Association Task Force on Clinical Practice Guidelines. J Am Coll Cardiol. 2019;73(24):3168-3209. Riley Lam, MD Electronically Signed: By: Riley Lam M.D. On: 11/22/2022 10:59    ASSESSMENT & PLAN:   57 y.o. 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 Malignancy cannot be excluded. Consider follow-up CT chest without contrast in 3-4 weeks in further initial evaluation, as clinically indicated. 2. Mild focal hypermetabolism anterior to the left aspect of the hyoid bone, without a definite CT correlate. 3. Tiny left renal stone.   08/17/18 CT Chest which revealed Interval decrease in overall volume of subpleural consolidation within the medial left lower lobe. Findings are likely the sequelae of inflammatory or infectious process. Consider repeat CT of the chest in 3 months to ensure complete resolution. 2. Unchanged appearance of small subpleural nodules within the right lower lobe and left upper lobe. 3.  Aortic Atherosclerosis 4. Coronary artery atherosclerotic calcifications.  07/04/2019 CT Neck and Chest ([id number]) ([id number]) revealed "1. Presumed left lower lobe scarring with resolution of medial lung base opacity now with only linear, bandlike area remaining. 2. Stable 4 mm subpleural nodule in the left lower lobe. Recommend attention on follow-up. 3. Subtle subpleural reticulation and some mild peripheral ground-glass is nonspecific but unchanged, perhaps related to chronic changes and mild interstitial disease, suggest attention on follow-up. 4. Stable low-density lesions in the liver, likely small cysts. Small lesion at the periphery of the right hepatic lobe is slightly more dense than these other areas. Recommend attention on follow-up. 5. Scattered coronary artery calcifications. Aortic Atherosclerosis (ICD10-I70.0)."   2. Constipation - resolved  3. Radiation Mucositis - grade 2 -resolved  4. Nodule in LLL of lung  5. 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 CBC normal, showed WBC of 5.1K, hemoglobin of 16.2, and platelets of 200K. -CMP normal -Discussed CT 12/12/2022 which did not reveal any  lung nodules -Discussed 11/22/2022 CT which revealed no chest wall masses or lung nodules noted -Continue current dose of Levothyroxine p.o. daily  -will order topical antifungal to improve left neck rash, may consider oral antifungal if symptoms are persistent -informed patient that optimizing thyroid levels would help to manage cholesterol levels -recommend patient to regularly use SPF 50+ while outdoors -Patient has  no lab or clinical evidence of progression/recurrence of his head and neck cancer -Patient continues to be in remission for 5 years and is discharged from  Oncology at this time -FU will PCP regularly for continued monitoring -Patient shall continue to follow-up with ENT as needed for local examination  FOLLOW-UP: RTC with PCP  The total time spent in the appointment was 25 minutes* .  All of the patient's questions were answered with apparent satisfaction. The patient knows to call the clinic with any problems, questions or concerns.   Wyvonnia Lora MD MS AAHIVMS [facility] Hematology/Oncology Physician [facility]  .*Total Encounter Time as defined by the Centers for Medicare and Medicaid Services includes, in addition to the face-to-face time of a patient visit (documented in the note above) non-face-to-face time: obtaining and reviewing outside history, ordering and reviewing medications, tests or procedures, care coordination (communications with other health care professionals or caregivers) and documentation in the medical record.    I,Mitra Faeizi,acting as a Neurosurgeon for Wyvonnia Lora, MD.,have documented all relevant documentation on the behalf of Wyvonnia Lora, MD,as directed by  Wyvonnia Lora, MD while in the presence of Wyvonnia Lora, MD.  .I have reviewed the above documentation for accuracy and completeness, and I agree with the above. Johney Maine MD

## 2022-12-20 ENCOUNTER — Encounter: Payer: Self-pay | Admitting: *Deleted

## 2022-12-20 ENCOUNTER — Encounter: Payer: Self-pay | Admitting: Hematology

## 2022-12-20 MED ORDER — CLOTRIMAZOLE 1 % EX CREA: 1.0000 | TOPICAL_CREAM | Freq: Two times a day (BID) | CUTANEOUS | 0 refills | Status: DC

## 2022-12-21 MED ORDER — CLOTRIMAZOLE 1 % EX CREA: 1.0000 | TOPICAL_CREAM | Freq: Two times a day (BID) | CUTANEOUS | 0 refills | Status: DC

## 2022-12-21 NOTE — Addendum Note (Signed)
Addended by: Wyvonnia Lora on: 12/21/2022 12:33 AM   Modules accepted: Orders

## 2023-01-06 ENCOUNTER — Other Ambulatory Visit: Payer: Managed Care, Other (non HMO)

## 2023-01-06 ENCOUNTER — Ambulatory Visit: Payer: Managed Care, Other (non HMO) | Admitting: Hematology

## 2023-04-06 ENCOUNTER — Encounter: Payer: Self-pay | Admitting: *Deleted

## 2023-04-24 ENCOUNTER — Encounter: Payer: Self-pay | Admitting: Orthopedic Surgery

## 2023-04-24 ENCOUNTER — Other Ambulatory Visit (INDEPENDENT_AMBULATORY_CARE_PROVIDER_SITE_OTHER): Payer: Managed Care, Other (non HMO)

## 2023-04-24 ENCOUNTER — Ambulatory Visit: Payer: Managed Care, Other (non HMO) | Admitting: Orthopedic Surgery

## 2023-04-24 VITALS — BP 172/133 | HR 99 | Ht 74.0 in | Wt 258.0 lb

## 2023-04-24 DIAGNOSIS — M7712 Lateral epicondylitis, left elbow: Secondary | ICD-10-CM | POA: Diagnosis not present

## 2023-04-24 DIAGNOSIS — M25522 Pain in left elbow: Secondary | ICD-10-CM

## 2023-04-24 MED ORDER — METHYLPREDNISOLONE ACETATE 40 MG/ML IJ SUSP
40.0000 mg | Freq: Once | INTRAMUSCULAR | Status: AC
  Administered 2023-04-24: 40 mg via INTRA_ARTICULAR

## 2023-04-24 NOTE — Progress Notes (Signed)
Patient: Travis Mcdonald           Date of Birth: Oct 18, 1965           MRN: [id number] Visit Date: 04/24/2023 Requested by: Benita Stabile, MD [address] [place],  [place] [address] PCP: Benita Stabile, MD   Chief Complaint  Patient presents with   Elbow Pain    Left around lateral epicondyle, into forearm    Encounter Diagnosis  Name Primary?   Left tennis elbow Yes    Plan:  Recommend tennis elbow program  Patient education Cryotherapy Anti-inflammatory Home exercises Cortisone injection  Follow-up as needed  Procedure note injection for left tennis elbow  Diagnosis left tennis elbow  Anesthesia ethyl chloride was used Alcohol use is clean the skin  After we obtained verbal consent and timeout a 25-gauge needle was used to inject 40 mg of Depo-Medrol and 3 cc of 1% lidocaine just distal to the insertion of the ECRB  There were no complications and a sterile bandage was applied.   Chief Complaint  Patient presents with   Elbow Pain    Left around lateral epicondyle, into forearm     56 year old male fell about a month ago he is also been doing some work around his house complains of lateral elbow pain    Body mass index is 33.13 kg/m.   Problem list, medical hx, medications and allergies reviewed   Review of Systems  All other systems reviewed and are negative.    Allergies  Allergen Reactions   Other Itching and Rash    Red and itchy with disposable BP cuff    BP (!) 172/133   Pulse 99   Ht 6\' 2"  (1.88 m)   Wt 258 lb (117 kg)   BMI 33.13 kg/m    Physical exam: Physical Exam Vitals and nursing note reviewed.  Constitutional:      General: He is not in acute distress.    Appearance: Normal appearance. He is not ill-appearing, toxic-appearing or diaphoretic.  HENT:     Head: Normocephalic and atraumatic.     Nose: Nose normal. No congestion or rhinorrhea.  Eyes:     General: No scleral icterus.       Right eye: No discharge.         Left eye: No discharge.     Extraocular Movements: Extraocular movements intact.     Conjunctiva/sclera: Conjunctivae normal.     Pupils: Pupils are equal, round, and reactive to light.  Cardiovascular:     Pulses: Normal pulses.  Pulmonary:     Effort: Pulmonary effort is normal.     Breath sounds: No wheezing.  Skin:    General: Skin is warm and dry.     Capillary Refill: Capillary refill takes less than 2 seconds.     Coloration: Skin is not jaundiced.     Findings: No erythema.  Neurological:     General: No focal deficit present.     Mental Status: He is alert and oriented to person, place, and time.  Psychiatric:        Mood and Affect: Mood normal.        Behavior: Behavior normal.        Thought Content: Thought content normal.        Judgment: Judgment normal.     Left Elbow Exam   Tenderness  The patient is experiencing tenderness in the lateral epicondyle.   Range of Motion  Extension:  abnormal  Flexion:  normal   Muscle Strength  The patient has normal left elbow strength.  Other  Erythema: absent Scars: absent Sensation: normal Pulse: present  Comments:  Provocative testing for tennis elbow  Wrist extension pain gets resistance Long finger elevation pain against resistance       Image(s) reviewed with personal interpretation:  X-ray negative for acute fracture mild spur around the humeral  and radial head  Assessment and plan:  Encounter Diagnosis  Name Primary?   Left tennis elbow Yes       Meds ordered this encounter  Medications   methylPREDNISolone acetate (DEPO-MEDROL) injection 40 mg    Procedures:   Inject left elbow

## 2023-05-11 ENCOUNTER — Encounter: Payer: Self-pay | Admitting: Orthopedic Surgery

## 2023-05-11 ENCOUNTER — Ambulatory Visit: Payer: Managed Care, Other (non HMO) | Admitting: Orthopedic Surgery

## 2023-05-11 ENCOUNTER — Other Ambulatory Visit (INDEPENDENT_AMBULATORY_CARE_PROVIDER_SITE_OTHER): Payer: Managed Care, Other (non HMO)

## 2023-05-11 VITALS — BP 167/11 | HR 69 | Ht 74.0 in | Wt 256.0 lb

## 2023-05-11 DIAGNOSIS — M25561 Pain in right knee: Secondary | ICD-10-CM

## 2023-05-11 DIAGNOSIS — M1711 Unilateral primary osteoarthritis, right knee: Secondary | ICD-10-CM

## 2023-05-11 DIAGNOSIS — M23321 Other meniscus derangements, posterior horn of medial meniscus, right knee: Secondary | ICD-10-CM

## 2023-05-11 MED ORDER — MELOXICAM 15 MG PO TABS: 15.0000 mg | ORAL_TABLET | Freq: Every day | ORAL | 0 refills | Status: AC

## 2023-05-11 NOTE — Progress Notes (Signed)
Office Visit Note   Patient: Travis Mcdonald           Date of Birth: 02-02-1966           MRN: [id number] Visit Date: 05/11/2023 Requested by: Benita Stabile, MD [address] [place],  [place] [address] PCP: Benita Stabile, MD   Assessment & Plan:   Encounter Diagnoses  Name Primary?  . Acute pain of right knee Yes  . Derangement of posterior horn of medial meniscus of right knee     No orders of the defined types were placed in this encounter.   Acute knee pain possible medial meniscus tear  57 year old male had some intermittent trouble with his knee over the years stepped off a truck injured the knee came in 1 day later with acute knee pain which she iced immediately so he has no swelling.  He is limping.  We put him in a brace I will start him on some anti-inflammatories per [facility] protocol and I will see him in 4 weeks to see if we need an MRI   Subjective: Chief Complaint  Patient presents with  . Knee Pain    R knee felt a pop and immediate pain after stepping off a truck DOI 05/10/23    HPI: 57 year old male acute knee pain after stepping off of a truck bumper complains of medial knee pain and swelling and a limp.  Preinjury history of intermittent issues with the knee nothing too serious              ROS: No chest pain or shortness of breath   Images personally read and my interpretation : Grade 1 OA on x-ray without effusion no fracture  Visit Diagnoses:  1. Acute pain of right knee   2. Derangement of posterior horn of medial meniscus of right knee      Follow-Up Instructions: Return in about 4 weeks (around 06/08/2023) for right knee pain , FOLLOW UP, RIGHT, KNEE.    Objective: Vital Signs: BP (!) 167/11   Pulse 69   Ht 6\' 2"  (1.88 m)   Wt 256 lb (116.1 kg)   BMI 32.87 kg/m   Physical Exam Vitals and nursing note reviewed.  Constitutional:      Appearance: Normal appearance.  HENT:     Head: Normocephalic and atraumatic.  Eyes:      General: No scleral icterus.       Right eye: No discharge.        Left eye: No discharge.     Extraocular Movements: Extraocular movements intact.     Conjunctiva/sclera: Conjunctivae normal.     Pupils: Pupils are equal, round, and reactive to light.  Cardiovascular:     Rate and Rhythm: Normal rate.     Pulses: Normal pulses.  Musculoskeletal:     Right knee: No effusion.     Instability Tests: Medial McMurray test positive.  Skin:    General: Skin is warm and dry.     Capillary Refill: Capillary refill takes less than 2 seconds.  Neurological:     General: No focal deficit present.     Mental Status: He is alert and oriented to person, place, and time.  Psychiatric:        Mood and Affect: Mood normal.        Behavior: Behavior normal.        Thought Content: Thought content normal.        Judgment: Judgment normal.  Right Knee Exam   Muscle Strength  The patient has normal right knee strength.  Tenderness  The patient is experiencing tenderness in the medial joint line.  Range of Motion  Extension:  abnormal Right knee extension: Extension caused pain. Flexion:  normal   Tests  McMurray:  Medial - positive  Varus: negative Valgus: negative Lachman:  Anterior - negative    Posterior - negative Drawer:  Anterior - negative    Posterior - negative  Other  Erythema: absent Scars: absent Sensation: normal Pulse: present Swelling: none Effusion: no effusion present  Comments:  Range of motion hip normal   Left Knee Exam  Left knee exam is normal.     Specialty Comments:  No specialty comments available.  Imaging: DG Knee AP/LAT W/Sunrise Right  Result Date: 05/11/2023 DG Knee AP/LAT W/Sunrise Right Right knee pain after slipping off of a truck pop felt X-ray of the right knee shows 3 to 4 degrees of valgus mild narrowing of the medial compartment without subchondral bone sclerosis over osteophyte formation no cysts are seen.  No effusion Impression  grade 1 OA right knee     PMFS History: Patient Active Problem List   Diagnosis Date Noted  . Port-A-Cath in place 02/26/2018  . Counseling regarding advance care planning and goals of care 02/18/2018  . Metastatic squamous cell carcinoma 01/29/2018  . Carcinoma of tonsillar fossa (HCC) 01/15/2018  . Cervical lymphadenopathy 12/26/2017  . Mass of left side of neck 11/10/2017  . Recurrent vertigo 11/10/2017  . Retrocalcaneal bursitis (back of heel) 09/30/2015   Past Medical History:  Diagnosis Date  . History of radiation therapy 02/05/18- 03/28/18   Left tonsil and neck bilaterally, 2 Gy X 35 fractions for a total dose of 70 Gy.   Marland Kitchen Hypertension     No family history on file.  Past Surgical History:  Procedure Laterality Date  . BACK SURGERY  1998, 1999   ruptured lower back disc surgery. L4 & L5 Lumber disc  . IR GASTROSTOMY TUBE MOD SED  02/02/2018  . IR GASTROSTOMY TUBE REMOVAL  07/27/2018  . IR IMAGING GUIDED PORT INSERTION  02/02/2018  . IR REMOVAL TUN ACCESS W/ PORT W/O FL MOD SED  07/27/2018   Social History   Occupational History  . Not on file  Tobacco Use  . Smoking status: Never  . Smokeless tobacco: Never  Vaping Use  . Vaping status: Never Used  Substance and Sexual Activity  . Alcohol use: No    Comment: no alcohol for 30 years.   . Drug use: No  . Sexual activity: Not on file

## 2023-06-19 ENCOUNTER — Encounter: Payer: Self-pay | Admitting: Internal Medicine

## 2023-06-19 ENCOUNTER — Ambulatory Visit: Payer: Managed Care, Other (non HMO) | Attending: Internal Medicine | Admitting: Internal Medicine

## 2023-06-19 ENCOUNTER — Ambulatory Visit: Payer: Managed Care, Other (non HMO) | Admitting: Internal Medicine

## 2023-06-19 VITALS — BP 156/80 | HR 78 | Ht 74.0 in | Wt 263.2 lb

## 2023-06-19 DIAGNOSIS — R931 Abnormal findings on diagnostic imaging of heart and coronary circulation: Secondary | ICD-10-CM | POA: Diagnosis not present

## 2023-06-19 DIAGNOSIS — I10 Essential (primary) hypertension: Secondary | ICD-10-CM

## 2023-06-19 DIAGNOSIS — Z7189 Other specified counseling: Secondary | ICD-10-CM | POA: Diagnosis not present

## 2023-06-19 DIAGNOSIS — E039 Hypothyroidism, unspecified: Secondary | ICD-10-CM

## 2023-06-19 DIAGNOSIS — T466X5D Adverse effect of antihyperlipidemic and antiarteriosclerotic drugs, subsequent encounter: Secondary | ICD-10-CM

## 2023-06-19 DIAGNOSIS — Z8249 Family history of ischemic heart disease and other diseases of the circulatory system: Secondary | ICD-10-CM | POA: Diagnosis not present

## 2023-06-19 DIAGNOSIS — E785 Hyperlipidemia, unspecified: Secondary | ICD-10-CM | POA: Insufficient documentation

## 2023-06-19 DIAGNOSIS — E782 Mixed hyperlipidemia: Secondary | ICD-10-CM

## 2023-06-19 DIAGNOSIS — M791 Myalgia, unspecified site: Secondary | ICD-10-CM

## 2023-06-19 DIAGNOSIS — I77810 Thoracic aortic ectasia: Secondary | ICD-10-CM

## 2023-06-19 MED ORDER — AMLODIPINE BESYLATE 5 MG PO TABS: 5.0000 mg | ORAL_TABLET | Freq: Every day | ORAL | 3 refills | Status: DC

## 2023-06-19 NOTE — Patient Instructions (Signed)
Medication Instructions:  Your physician has recommended you make the following change in your medication:   -Start Amlodipine 5 mg tablet once daily.   *If you need a refill on your cardiac medications before your next appointment, please call your pharmacy*   Lab Work: LP(a)  If you have labs (blood work) drawn today and your tests are completely normal, you will receive your results only by: MyChart Message (if you have MyChart) OR A paper copy in the mail If you have any lab test that is abnormal or we need to change your treatment, we will call you to review the results.   Testing/Procedures: None   Follow-Up: At [facility], you and your health needs are our priority.  As part of our continuing mission to provide you with exceptional heart care, we have created designated Provider Care Teams.  These Care Teams include your primary Cardiologist (physician) and Advanced Practice Providers (APPs -  Physician Assistants and Nurse Practitioners) who all work together to provide you with the care you need, when you need it.  We recommend signing up for the patient portal called "MyChart".  Sign up information is provided on this After Visit Summary.  MyChart is used to connect with patients for Virtual Visits (Telemedicine).  Patients are able to view lab/test results, encounter notes, upcoming appointments, etc.  Non-urgent messages can be sent to your provider as well.   To learn more about what you can do with MyChart, go to [email or web address].    Your next appointment:   3 month(s)  Provider:   You may see Luane School, MD or one of the following Advanced Practice Providers on your designated Care Team:   [place], PA-C  Jacolyn Reedy, [place]     Other Instructions

## 2023-06-19 NOTE — Progress Notes (Signed)
Cardiology Office Note  Date: 06/19/2023   ID: Travis Mcdonald, DOB 1966/04/26, MRN [id number]  PCP:  Benita Stabile, MD  Cardiologist:  None Electrophysiologist:  None   History of Present Illness: Travis Mcdonald is a 57 y.o. male known to have HTN, hypothyroidism, HLD, was referred to cardiology clinic for management of HLD.  CT cardiac scoring showed coronary calcium score of 256 which is 88 percentile for age and sex matched control. There was also evidence of mild ascending aortic dilatation, 40 mm on noncontrasted study. I reviewed the heart care referral records from the PCP that showed LDL 182, TG 163, total cholesterol 250 and HDL 38 from 03/2023. TSH levels were also elevated due to which his levothyroxine dose was increased from 50 to 75 mcg daily around 2 months ago.  Patient is here as new patient visit.  He has a family history major CAD, his mother had PCI in her late 28s.  She is alive and in her 22s.  Patient has no symptoms of angina, DOE, orthopnea, PND, syncope, lightheadedness, palpitations or leg swelling.  He does have vertigo, sporadic in nature.  He was taking rosuvastatin 20 mg in the past with severe myalgias and currently on rosuvastatin 5 mg nightly and tolerating very well.  Past Medical History:  Diagnosis Date   History of radiation therapy 02/05/18- 03/28/18   Left tonsil and neck bilaterally, 2 Gy X 35 fractions for a total dose of 70 Gy.    Hypertension     Past Surgical History:  Procedure Laterality Date   BACK SURGERY  1998, 1999   ruptured lower back disc surgery. L4 & L5 Lumber disc   IR GASTROSTOMY TUBE MOD SED  02/02/2018   IR GASTROSTOMY TUBE REMOVAL  07/27/2018   IR IMAGING GUIDED PORT INSERTION  02/02/2018   IR REMOVAL TUN ACCESS W/ PORT W/O FL MOD SED  07/27/2018    Current Outpatient Medications  Medication Sig Dispense Refill   levothyroxine (SYNTHROID) 25 MCG tablet TAKE 1 TABLET(25 MCG) BY MOUTH DAILY BEFORE BREAKFAST 30 tablet 5    losartan (COZAAR) 50 MG tablet Take 50 mg by mouth daily.     meloxicam (MOBIC) 15 MG tablet Take 1 tablet (15 mg total) by mouth daily. (Patient taking differently: Take 15 mg by mouth daily as needed for pain.) 60 tablet 0   rosuvastatin (CRESTOR) 5 MG tablet Take 5 mg by mouth daily.     clotrimazole (LOTRIMIN) 1 % cream Apply 1 Application topically 2 (two) times daily. (Patient not taking: Reported on 06/19/2023) 60 g 0   No current facility-administered medications for this visit.   Allergies:  Other   Social History: The patient  reports that he has never smoked. He has never used smokeless tobacco. He reports that he does not drink alcohol and does not use drugs.   Family History: The patient's family history is not on file.   ROS:  Please see the history of present illness. Otherwise, complete review of systems is positive for none.  All other systems are reviewed and negative.   Physical Exam: VS:  BP (!) 164/98 (BP Location: Left Arm)   Pulse 78   Ht 6\' 2"  (1.88 m)   Wt 263 lb 3.2 oz (119.4 kg)   SpO2 98%   BMI 33.79 kg/m , BMI Body mass index is 33.79 kg/m.  Wt Readings from Last 3 Encounters:  06/19/23 263 lb 3.2 oz (119.4 kg)  05/11/23 256  lb (116.1 kg)  04/24/23 258 lb (117 kg)    General: Patient appears comfortable at rest. HEENT: Conjunctiva and lids normal, oropharynx clear with moist mucosa. Neck: Supple, no elevated JVP or carotid bruits, no thyromegaly. Lungs: Clear to auscultation, nonlabored breathing at rest. Cardiac: Regular rate and rhythm, no S3 or significant systolic murmur, no pericardial rub. Abdomen: Soft, nontender, no hepatomegaly, bowel sounds present, no guarding or rebound. Extremities: No pitting edema, distal pulses 2+. Skin: Warm and dry. Musculoskeletal: No kyphosis. Neuropsychiatric: Alert and oriented x3, affect grossly appropriate.  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  However due to family history premature CAD (mother had PCI in her late 36s), goal LDL will need to be less than 70.  Obtain lipoprotein a levels.  He tried rosuvastatin 20 mg nightly in the past with severe myalgias and currently tolerating rosuvastatin 5 mg with no side effects.  His TSH levels were recently elevated and levothyroxine dose was increased from 50 to 75 mcg around 2 months ago, will need to repeat lipid panel after his TSH levels are normalized as hypothyroidism can elevate cholesterol/LDL levels.  I will continue rosuvastatin 5 mg nightly for now and have lab reports (including TSH and lipid panel) requested from his PCP in January 2025.  Diet and exercise counseling strongly encouraged, 30 minutes daily exercise for 5 days a day. -His home BPs are also elevated, 160 mmHg SBP, losartan dose was recently increased from 50 mg to 100 mg once daily.  Despite the increase, home BPs continue to be elevated.  Will start amlodipine 5 mg once daily. -Needs annual surveillance for ascending aorta dilatation.   I spent a total duration of 45 minutes during the PCPs records, labs, notes, face-to-face discussion/counseling of his medical condition, pathophysiology, evaluation, management, ordering labs and documenting the findings in the note.    Medication Adjustments/Labs and Tests  Ordered: Current medicines are reviewed at length with the patient today.  Concerns regarding medicines are outlined above.    Disposition:  Follow up 3 months  Signed, Genean Adamski Verne Spurr, MD, 06/19/2023 8:46 AM    [facility] at [facility] [address] [place], [place] [address]

## 2023-06-20 ENCOUNTER — Encounter: Payer: Self-pay | Admitting: Internal Medicine

## 2023-07-18 ENCOUNTER — Encounter: Payer: Self-pay | Admitting: Internal Medicine

## 2023-07-28 ENCOUNTER — Other Ambulatory Visit: Payer: Self-pay

## 2023-07-28 DIAGNOSIS — Z8249 Family history of ischemic heart disease and other diseases of the circulatory system: Secondary | ICD-10-CM

## 2023-07-28 DIAGNOSIS — E782 Mixed hyperlipidemia: Secondary | ICD-10-CM

## 2023-07-28 DIAGNOSIS — R931 Abnormal findings on diagnostic imaging of heart and coronary circulation: Secondary | ICD-10-CM

## 2023-10-04 ENCOUNTER — Ambulatory Visit: Payer: Managed Care, Other (non HMO) | Admitting: Student

## 2023-10-09 ENCOUNTER — Encounter (INDEPENDENT_AMBULATORY_CARE_PROVIDER_SITE_OTHER): Payer: Self-pay | Admitting: *Deleted

## 2023-10-16 ENCOUNTER — Other Ambulatory Visit: Payer: Self-pay | Admitting: Hematology

## 2023-11-03 ENCOUNTER — Other Ambulatory Visit (HOSPITAL_COMMUNITY): Payer: Self-pay | Admitting: Internal Medicine

## 2023-11-03 DIAGNOSIS — I77810 Thoracic aortic ectasia: Secondary | ICD-10-CM

## 2023-11-07 ENCOUNTER — Encounter: Payer: Self-pay | Admitting: *Deleted

## 2023-11-28 NOTE — Progress Notes (Unsigned)
 Cardiology Office Note:  .   Date:  11/29/2023  ID:  Caryn Clause, DOB [birth year], MRN [id number] PCP: Omie Bickers, MD  [facility] Providers Cardiologist:  Lasalle Pointer, MD {    History of Present Illness: .   Travis Mcdonald is a 58 y.o. male with pmhx of Elevated coronary calcium score (4/24: 256), HLD, HTN, hypothyroidism for 3 m/o follow up.   Last seen on 06/19/2023 in [facility] with Dr. Mallipeddi for new patient visit in regards to HLD mgmt. Continued on Rosuvastatin 5 mg with hx of myalgia on 20 mg. Also, reportedly home SBP elevated to 160's. Continued on Losartan 100 mg daily with new addition of Amlodipine 5 mg daily. Most recent Lipid panel on 10/30/2023 revealed LDL 108, HDL 42, TCHOL 174.   On interview, patient reported chronic intermittent dizziness that has been unchanged related to vertigo. Denies any CP, SOB, palpations, edema, orthopnea. He appears very active at baseline. 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 He endorses a healthy diet. Denies any etoh, tobacco, or drugs.   Studies Reviewed: .       CT Cardiac Scoring 11/2022 IMPRESSION: 1. Coronary calcium score of 256. This was 88th percentile for age, gender, and race matched controls. 2. Aortic atherosclerosis. 3. Evidence of mild ascending aortic dilation, 40 mm, on non-contrasted study. Consider secondary imaging modality (echocardiogram, CTA Aorta Protocol, MRA Aorta Protocol) if clinically indicated.  Risk Assessment/Calculations:     HYPERTENSION CONTROL Vitals:   11/29/23 1340 11/29/23 1411  BP: (!) 140/96 (!) 136/90    The patient's blood pressure is elevated above target today.  In order to address the patient's elevated BP: A current anti-hypertensive medication was adjusted today.          Physical Exam:   VS:  BP (!) 136/90   Pulse 74   Ht 6\' 2"  (1.88 m)   Wt 266 lb (120.7 kg)   SpO2 96%   BMI 34.15 kg/m    Wt Readings from Last 3  Encounters:  11/29/23 266 lb (120.7 kg)  06/19/23 263 lb 3.2 oz (119.4 kg)  05/11/23 256 lb (116.1 kg)    GEN: Sitting in chair, in no acute distress NECK: No JVD; No carotid bruits CARDIAC: RRR, no murmurs, rubs, gallops RESPIRATORY:  Clear to auscultation without rales, wheezing or rhonchi  ABDOMEN: Soft, non-tender, non-distended EXTREMITIES:  No edema; No deformity   ASSESSMENT AND PLAN: .    HLD, goal < 70  Statin Intolerant - 03/2023: LDL 182, HDL 38, TG 163, TCHOL 250 - 10/30/2023: LDL 108, HDL 42, TG 137, TCHOL 174  - Intolerant to high intensity statin in past with severe myalgia  - Continue on Rosuvastatin 5 mg  - Discussed either increase to 10 mg or add Zetia 10 mg. Patient would prefer remaining at current dose with lifestyle modifications. Recommended healthy diet, exercise and red yeast rice.   HTN   - BP in office: 140/96, repeat BP 136/90, Home Bps: 140's/80's, goal < 130/80 - 10/31/23: Cr 1.83, K 4.9  - Increased Amlodipine to 10 mg daily. Not sure if taking Losartan 50 or 100 mg. Will call with update on correct dosage.   Elevated coronary calcium score - 4/24: 256, 88th percentile for age and sex matched control - continue statin as above  Family history of premature CAD - Mother had PCI in late 72s - continue statin as above  Ascending aorta  dilatation  -  4/24 CT: 40 mm -  Already scheduled for annual follow up on 12/14/23  Hypothyroidism - 10/31/23 Thyroid Panel  WNL  - followed by PCP  Dispo: 1 year  Signed, Metta Actis, PA-C

## 2023-11-29 ENCOUNTER — Encounter: Payer: Self-pay | Admitting: Physician Assistant

## 2023-11-29 ENCOUNTER — Ambulatory Visit: Payer: Managed Care, Other (non HMO) | Attending: Student | Admitting: Physician Assistant

## 2023-11-29 VITALS — BP 136/90 | HR 74 | Ht 74.0 in | Wt 266.0 lb

## 2023-11-29 DIAGNOSIS — E782 Mixed hyperlipidemia: Secondary | ICD-10-CM

## 2023-11-29 DIAGNOSIS — M791 Myalgia, unspecified site: Secondary | ICD-10-CM | POA: Diagnosis not present

## 2023-11-29 DIAGNOSIS — Z8249 Family history of ischemic heart disease and other diseases of the circulatory system: Secondary | ICD-10-CM

## 2023-11-29 DIAGNOSIS — R931 Abnormal findings on diagnostic imaging of heart and coronary circulation: Secondary | ICD-10-CM | POA: Diagnosis not present

## 2023-11-29 DIAGNOSIS — I10 Essential (primary) hypertension: Secondary | ICD-10-CM

## 2023-11-29 DIAGNOSIS — I77810 Thoracic aortic ectasia: Secondary | ICD-10-CM

## 2023-11-29 DIAGNOSIS — E039 Hypothyroidism, unspecified: Secondary | ICD-10-CM

## 2023-11-29 DIAGNOSIS — T466X5D Adverse effect of antihyperlipidemic and antiarteriosclerotic drugs, subsequent encounter: Secondary | ICD-10-CM

## 2023-11-29 MED ORDER — AMLODIPINE BESYLATE 10 MG PO TABS: 10.0000 mg | ORAL_TABLET | Freq: Every day | ORAL | 3 refills | Status: AC

## 2023-11-29 NOTE — Patient Instructions (Signed)
 Medication Instructions:   Increase Norvasc to 10 mg  Daily   Please call the office and let us  know if your are taking Losartan 50 mg or Losartan 100 mg daily   *If you need a refill on your cardiac medications before your next appointment, please call your pharmacy*  Lab Work: NONE   If you have labs (blood work) drawn today and your tests are completely normal, you will receive your results only by: MyChart Message (if you have MyChart) OR A paper copy in the mail If you have any lab test that is abnormal or we need to change your treatment, we will call you to review the results.  Testing/Procedures: NONE   Follow-Up: At [facility], you and your health needs are our priority.  As part of our continuing mission to provide you with exceptional heart care, our providers are all part of one team.  This team includes your primary Cardiologist (physician) and Advanced Practice Providers or APPs (Physician Assistants and Nurse Practitioners) who all work together to provide you with the care you need, when you need it.  Your next appointment:   1 year(s)  Provider:   You may see Vishnu P Mallipeddi, MD or one of the following Advanced Practice Providers on your designated Care Team:   [place], PA-C  [place], [place] Theotis Flake, [place]     We recommend signing up for the patient portal called "MyChart".  Sign up information is provided on this After Visit Summary.  MyChart is used to connect with patients for Virtual Visits (Telemedicine).  Patients are able to view lab/test results, encounter notes, upcoming appointments, etc.  Non-urgent messages can be sent to your provider as well.   To learn more about what you can do with MyChart, go to [email or web address].   Other Instructions Thank you for choosing [facility]!

## 2023-12-14 ENCOUNTER — Ambulatory Visit (HOSPITAL_COMMUNITY)
Admission: RE | Admit: 2023-12-14 | Discharge: 2023-12-14 | Disposition: A | Discharge status: Home / Self Care | Source: Ambulatory Visit | Attending: Internal Medicine | Admitting: Internal Medicine

## 2023-12-14 DIAGNOSIS — I77810 Thoracic aortic ectasia: Secondary | ICD-10-CM | POA: Diagnosis present

## 2023-12-14 MED ORDER — IOHEXOL 350 MG/ML SOLN
75.0000 mL | Freq: Once | INTRAVENOUS | Status: AC | PRN
  Administered 2023-12-14: 75 mL via INTRAVENOUS

## 2024-01-18 ENCOUNTER — Telehealth: Payer: Self-pay

## 2024-01-18 NOTE — Telephone Encounter (Signed)
 Who is your primary care physician: DR.HALL  Reasons for the colonoscopy: SCREENING  Have you had a colonoscopy before?  NO  Do you have family history of colon cancer? NO  Previous colonoscopy with polyps removed? NO  Do you have a history colorectal cancer?   NO  Are you diabetic? If yes, Type 1 or Type 2?    NO  Do you have a prosthetic or mechanical heart valve? NO  Do you have a pacemaker/defibrillator?   NO  Have you had endocarditis/atrial fibrillation? NO  Have you had joint replacement within the last 12 months?  NO  Do you tend to be constipated or have to use laxatives? NO  Do you have any history of drugs or alchohol?  NO  Do you use supplemental oxygen?  NO  Have you had a stroke or heart attack within the last 6 months? NO  Do you take weight loss medication?  NO      Do you take any blood-thinning medications such as: (aspirin, warfarin, Plavix, Aggrenox)  NO  If yes we need the name, milligram, dosage and who is prescribing doctor  Current Outpatient Medications on File Prior to Visit  Medication Sig Dispense Refill   amLODipine  (NORVASC ) 10 MG tablet Take 1 tablet (10 mg total) by mouth daily. 180 tablet 3   ezetimibe (ZETIA) 10 MG tablet Take 10 mg by mouth daily.     levothyroxine  (SYNTHROID ) 25 MCG tablet TAKE 1 TABLET(25 MCG) BY MOUTH DAILY BEFORE BREAKFAST 30 tablet 5   losartan (COZAAR) 50 MG tablet Take 50 mg by mouth daily.     meloxicam  (MOBIC ) 15 MG tablet Take 1 tablet (15 mg total) by mouth daily. (Patient taking differently: Take 15 mg by mouth daily as needed for pain.) 60 tablet 0   rosuvastatin (CRESTOR) 5 MG tablet Take 5 mg by mouth daily.     No current facility-administered medications on file prior to visit.    Allergies  Allergen Reactions   Other Itching and Rash    Red and itchy with disposable BP cuff     Pharmacy: [facility]  Primary Insurance Name: [employer] [id number]  Best number where you  can be reached: [phone]

## 2024-03-19 NOTE — Telephone Encounter (Signed)
 ASA 2, appropriate

## 2024-03-20 NOTE — Telephone Encounter (Signed)
 LMOVM to call back

## 2024-03-28 ENCOUNTER — Encounter (INDEPENDENT_AMBULATORY_CARE_PROVIDER_SITE_OTHER): Payer: Self-pay | Admitting: *Deleted

## 2024-03-28 MED ORDER — NA SULFATE-K SULFATE-MG SULF 17.5-3.13-1.6 GM/177ML PO SOLN
1.0000 | Freq: Once | ORAL | 0 refills | Status: AC
Start: 2024-03-28 — End: 2024-03-28

## 2024-03-28 NOTE — Addendum Note (Signed)
 Addended by: JEANELL GRAEME RAMAN on: 03/28/2024 01:09 PM   Modules accepted: Orders

## 2024-03-28 NOTE — Telephone Encounter (Signed)
 Referral completed, [facility] apt letter sent to PCP

## 2024-03-28 NOTE — Telephone Encounter (Signed)
 Spoke with pt. Scheduled with Dr. Cindie 9/25. Aware will send instructions to him. Rx for prep sent to pharmacy.

## 2024-05-09 ENCOUNTER — Encounter (HOSPITAL_COMMUNITY): Payer: Self-pay | Admitting: Internal Medicine

## 2024-05-09 ENCOUNTER — Other Ambulatory Visit: Payer: Self-pay

## 2024-05-09 ENCOUNTER — Ambulatory Visit (HOSPITAL_COMMUNITY)
Admission: RE | Admit: 2024-05-09 | Discharge: 2024-05-09 | Disposition: A | Discharge status: Home / Self Care | Attending: Internal Medicine | Admitting: Internal Medicine

## 2024-05-09 ENCOUNTER — Encounter (HOSPITAL_COMMUNITY)
Admission: RE | Disposition: A | Discharge status: Home / Self Care | Payer: Self-pay | Source: Home / Self Care | Attending: Internal Medicine

## 2024-05-09 ENCOUNTER — Ambulatory Visit (HOSPITAL_COMMUNITY): Admitting: Anesthesiology

## 2024-05-09 DIAGNOSIS — D123 Benign neoplasm of transverse colon: Secondary | ICD-10-CM | POA: Diagnosis not present

## 2024-05-09 DIAGNOSIS — K648 Other hemorrhoids: Secondary | ICD-10-CM

## 2024-05-09 DIAGNOSIS — Z1211 Encounter for screening for malignant neoplasm of colon: Secondary | ICD-10-CM

## 2024-05-09 DIAGNOSIS — E039 Hypothyroidism, unspecified: Secondary | ICD-10-CM | POA: Diagnosis not present

## 2024-05-09 DIAGNOSIS — D124 Benign neoplasm of descending colon: Secondary | ICD-10-CM | POA: Diagnosis not present

## 2024-05-09 DIAGNOSIS — I2510 Atherosclerotic heart disease of native coronary artery without angina pectoris: Secondary | ICD-10-CM | POA: Diagnosis not present

## 2024-05-09 DIAGNOSIS — Z79899 Other long term (current) drug therapy: Secondary | ICD-10-CM | POA: Insufficient documentation

## 2024-05-09 DIAGNOSIS — Z791 Long term (current) use of non-steroidal anti-inflammatories (NSAID): Secondary | ICD-10-CM | POA: Insufficient documentation

## 2024-05-09 DIAGNOSIS — I10 Essential (primary) hypertension: Secondary | ICD-10-CM | POA: Insufficient documentation

## 2024-05-09 DIAGNOSIS — K635 Polyp of colon: Secondary | ICD-10-CM | POA: Diagnosis not present

## 2024-05-09 DIAGNOSIS — Z79890 Hormone replacement therapy (postmenopausal): Secondary | ICD-10-CM | POA: Diagnosis not present

## 2024-05-09 HISTORY — PX: COLONOSCOPY: SHX5424

## 2024-05-09 HISTORY — DX: Malignant (primary) neoplasm, unspecified: C80.1

## 2024-05-09 SURGERY — COLONOSCOPY
Anesthesia: General

## 2024-05-09 MED ORDER — LIDOCAINE HCL (CARDIAC) PF 100 MG/5ML IV SOSY
PREFILLED_SYRINGE | INTRAVENOUS | Status: DC | PRN
  Administered 2024-05-09: 50 mg via INTRAVENOUS

## 2024-05-09 MED ORDER — PROPOFOL 10 MG/ML IV BOLUS
INTRAVENOUS | Status: DC | PRN
  Administered 2024-05-09 (×3): 50 mg via INTRAVENOUS
  Administered 2024-05-09: 100 mg via INTRAVENOUS
  Administered 2024-05-09: 50 mg via INTRAVENOUS

## 2024-05-09 MED ORDER — LACTATED RINGERS IV SOLN: INTRAVENOUS | Status: DC | PRN

## 2024-05-09 NOTE — Anesthesia Preprocedure Evaluation (Signed)
 Anesthesia Evaluation  Patient identified by MRN, date of birth, ID band Patient awake    Reviewed: Allergy & Precautions, H&P , NPO status , Patient's Chart, lab work & pertinent test results, reviewed documented beta blocker date and time   Airway Mallampati: II  TM Distance: >3 FB Neck ROM: full    Dental no notable dental hx.    Pulmonary neg pulmonary ROS   Pulmonary exam normal breath sounds clear to auscultation       Cardiovascular Exercise Tolerance: Good hypertension, + CAD   Rhythm:regular Rate:Normal     Neuro/Psych negative neurological ROS  negative psych ROS   GI/Hepatic negative GI ROS, Neg liver ROS,,,  Endo/Other  Hypothyroidism    Renal/GU negative Renal ROS  negative genitourinary   Musculoskeletal   Abdominal   Peds  Hematology negative hematology ROS (+)   Anesthesia Other Findings   Reproductive/Obstetrics negative OB ROS                              Anesthesia Physical Anesthesia Plan  ASA: 2  Anesthesia Plan: General   Post-op Pain Management:    Induction:   PONV Risk Score and Plan: Propofol  infusion  Airway Management Planned:   Additional Equipment:   Intra-op Plan:   Post-operative Plan:   Informed Consent: I have reviewed the patients History and Physical, chart, labs and discussed the procedure including the risks, benefits and alternatives for the proposed anesthesia with the patient or authorized representative who has indicated his/her understanding and acceptance.     Dental Advisory Given  Plan Discussed with: CRNA  Anesthesia Plan Comments:         Anesthesia Quick Evaluation

## 2024-05-09 NOTE — Discharge Instructions (Addendum)
  Colonoscopy Discharge Instructions  Read the instructions outlined below and refer to this sheet in the next few weeks. These discharge instructions provide you with general information on caring for yourself after you leave the hospital. Your doctor may also give you specific instructions. While your treatment has been planned according to the most current medical practices available, unavoidable complications occasionally occur.   ACTIVITY You may resume your regular activity, but move at a slower pace for the next 24 hours.  Take frequent rest periods for the next 24 hours.  Walking will help get rid of the air and reduce the bloated feeling in your belly (abdomen).  No driving for 24 hours (because of the medicine (anesthesia) used during the test).   Do not sign any important legal documents or operate any machinery for 24 hours (because of the anesthesia used during the test).  NUTRITION Drink plenty of fluids.  You may resume your normal diet as instructed by your doctor.  Begin with a light meal and progress to your normal diet. Heavy or fried foods are harder to digest and may make you feel sick to your stomach (nauseated).  Avoid alcoholic beverages for 24 hours or as instructed.  MEDICATIONS You may resume your normal medications unless your doctor tells you otherwise.  WHAT YOU CAN EXPECT TODAY Some feelings of bloating in the abdomen.  Passage of more gas than usual.  Spotting of blood in your stool or on the toilet paper.  IF YOU HAD POLYPS REMOVED DURING THE COLONOSCOPY: No aspirin products for 7 days or as instructed.  No alcohol for 7 days or as instructed.  Eat a soft diet for the next 24 hours.  FINDING OUT THE RESULTS OF YOUR TEST Not all test results are available during your visit. If your test results are not back during the visit, make an appointment with your caregiver to find out the results. Do not assume everything is normal if you have not heard from your  caregiver or the medical facility. It is important for you to follow up on all of your test results.  SEEK IMMEDIATE MEDICAL ATTENTION IF: You have more than a spotting of blood in your stool.  Your belly is swollen (abdominal distention).  You are nauseated or vomiting.  You have a temperature over 101.  You have abdominal pain or discomfort that is severe or gets worse throughout the day.   Your colonoscopy revealed 3 polyp(s) which I removed successfully. Await pathology results, my office will contact you. I recommend repeating colonoscopy in 5-7 years for surveillance purposes depending on pathology results.   Otherwise follow up as needed.   I hope you have a great rest of your week!  Carlin POUR. Cindie, D.O. Gastroenterology and Hepatology [facility]

## 2024-05-09 NOTE — Transfer of Care (Signed)
 Immediate Anesthesia Transfer of Care Note  Patient: Travis Mcdonald  Procedure(s) Performed: COLONOSCOPY  Patient Location: Endoscopy Unit  Anesthesia Type:General  Level of Consciousness: drowsy, patient cooperative, and responds to stimulation  Airway & Oxygen Therapy: Patient Spontanous Breathing  Post-op Assessment: Report given to RN and Post -op Vital signs reviewed and stable  Post vital signs: Reviewed and stable  Last Vitals:  Vitals Value Taken Time  BP 111/57 05/09/24 09:09  Temp 36.5 C 05/09/24 09:09  Pulse 61 05/09/24 09:09  Resp 16 05/09/24 09:09  SpO2 96 % 05/09/24 09:09    Last Pain:  Vitals:   05/09/24 0909  TempSrc: Oral  PainSc:       Patients Stated Pain Goal: 10 (05/09/24 0741)  Complications: No notable events documented.

## 2024-05-09 NOTE — H&P (Signed)
 Primary Care Physician:  Shona Norleen PEDLAR, MD Primary Gastroenterologist:  Dr. Cindie  Pre-Procedure History & Physical: HPI:  Travis Mcdonald is a 58 y.o. male is here for a colonoscopy for colon cancer screening purposes.  Patient denies any family history of colorectal cancer.    Past Medical History:  Diagnosis Date   Cancer [facility])    tonsil cancer   History of radiation therapy 02/05/18- 03/28/18   Left tonsil and neck bilaterally, 2 Gy X 35 fractions for a total dose of 70 Gy.    Hypertension     Past Surgical History:  Procedure Laterality Date   BACK SURGERY  1998, 1999   ruptured lower back disc surgery. L4 & L5 Lumber disc   IR GASTROSTOMY TUBE MOD SED  02/02/2018   IR GASTROSTOMY TUBE REMOVAL  07/27/2018   IR IMAGING GUIDED PORT INSERTION  02/02/2018   IR REMOVAL TUN ACCESS W/ PORT W/O FL MOD SED  07/27/2018    Prior to Admission medications   Medication Sig Start Date End Date Taking? Authorizing Provider  ezetimibe (ZETIA) 10 MG tablet Take 10 mg by mouth daily. 11/03/23  Yes [provider]  levothyroxine  (SYNTHROID ) 25 MCG tablet TAKE 1 TABLET(25 MCG) BY MOUTH DAILY BEFORE BREAKFAST 08/09/22  Yes Onesimo Emaline Brink, MD  losartan (COZAAR) 50 MG tablet Take 50 mg by mouth daily. 11/30/22  Yes [provider]  meloxicam  (MOBIC ) 15 MG tablet Take 1 tablet (15 mg total) by mouth daily. Patient taking differently: Take 15 mg by mouth daily as needed for pain. 05/11/23  Yes Margrette Taft BRAVO, MD  rosuvastatin (CRESTOR) 5 MG tablet Take 5 mg by mouth daily. 04/04/23  Yes [provider]  amLODipine  (NORVASC ) 10 MG tablet Take 1 tablet (10 mg total) by mouth daily. 11/29/23 02/27/24  Sheron Lorette GRADE, PA-C  Red Yeast Rice Extract (RED YEAST RICE PO) Take by mouth. Patient not taking: Reported on 05/09/2024    [provider]    Allergies as of 03/28/2024 - Review Complete 01/18/2024  Allergen Reaction Noted   Other Itching and Rash 07/27/2018     History reviewed. No pertinent family history.  Social History   Socioeconomic History   Marital status: Married    Spouse name: Not on file   Number of children: Not on file   Years of education: Not on file   Highest education level: Not on file  Occupational History   Not on file  Tobacco Use   Smoking status: Never   Smokeless tobacco: Never  Vaping Use   Vaping status: Never Used  Substance and Sexual Activity   Alcohol use: Yes    Comment: no alcohol for 30 years.; occassional   Drug use: No   Sexual activity: Not on file  Other Topics Concern   Not on file  Social History Narrative   Not on file   Social Drivers of Health   Financial Resource Strain: Not on file  Food Insecurity: Not on file  Transportation Needs: No Transportation Needs (07/17/2018)   PRAPARE - Administrator, Civil Service (Medical): No    Lack of Transportation (Non-Medical): No  Physical Activity: Not on file  Stress: Not on file  Social Connections: Unknown (11/25/2022)   Received from [facility]   Social Network    Social Network: Not on file  Intimate Partner Violence: Unknown (11/25/2022)   Received from [facility]   HITS    Physically Hurt: Not  on file    Insult or Talk Down To: Not on file    Threaten Physical Harm: Not on file    Scream or Curse: Not on file    Review of Systems: See HPI, otherwise negative ROS  Physical Exam: Vital signs in last 24 hours: Temp:  [98.1 F (36.7 C)] 98.1 F (36.7 C) (09/25 0753) Pulse Rate:  [56] 56 (09/25 0753) Resp:  [18] 18 (09/25 0753) BP: (141)/(74) 141/74 (09/25 0753) SpO2:  [97 %] 97 % (09/25 0753) Weight:  [117.9 kg] 117.9 kg (09/25 0741)   General:   Alert,  Well-developed, well-nourished, pleasant and cooperative in NAD Head:  Normocephalic and atraumatic. Eyes:  Sclera clear, no icterus.   Conjunctiva pink. Ears:  Normal auditory acuity. Nose:  No deformity, discharge,  or lesions. Msk:  Symmetrical  without gross deformities. Normal posture. Extremities:  Without clubbing or edema. Neurologic:  Alert and  oriented x4;  grossly normal neurologically. Skin:  Intact without significant lesions or rashes. Psych:  Alert and cooperative. Normal mood and affect.  Impression/Plan: Travis Mcdonald is here for a colonoscopy to be performed for colon cancer screening purposes.  The risks of the procedure including infection, bleed, or perforation as well as benefits, limitations, alternatives and imponderables have been reviewed with the patient. Questions have been answered. All parties agreeable.

## 2024-05-09 NOTE — Op Note (Signed)
 [facility] Patient Name: Travis Mcdonald Procedure Date: 05/09/2024 8:44 AM MRN: [id number] Date of Birth: 1965-09-30 Attending MD: Carlin POUR. Cindie , [place], [id number] CSN: [id number] Age: 58 Admit Type: Outpatient Procedure:                Colonoscopy Indications:              Screening for colorectal malignant neoplasm Providers:                Carlin POUR. Cindie, DO, Leandrew Edelman RN, RN, Jon Loge Referring MD:              Medicines:                See the Anesthesia note for documentation of the                            administered medications Complications:            No immediate complications. Estimated Blood Loss:     Estimated blood loss was minimal. Procedure:                Pre-Anesthesia Assessment:                           - The anesthesia plan was to use monitored                            anesthesia care (MAC).                           After obtaining informed consent, the colonoscope                            was passed under direct vision. Throughout the                            procedure, the patient's blood pressure, pulse, and                            oxygen saturations were monitored continuously. The                            PCF-HQ190L ([id number]) Peds Colon was introduced                            through the anus and advanced to the the cecum,                            identified by appendiceal orifice and ileocecal                            valve. The colonoscopy was performed without                            difficulty. The patient tolerated the procedure  well. The quality of the bowel preparation was                            evaluated using the BBPS [facility] Bowel Preparation                            Scale) with scores of: Right Colon = 3, Transverse                            Colon = 3 and Left Colon = 3 (entire mucosa seen                            well with no residual  staining, small fragments of                            stool or opaque liquid). The total BBPS score                            equals 9. Scope In: 8:54:30 AM Scope Out: 9:06:37 AM Scope Withdrawal Time: 0 hours 9 minutes 54 seconds  Total Procedure Duration: 0 hours 12 minutes 7 seconds  Findings:      Non-bleeding internal hemorrhoids were found.      Three pedunculated and sessile polyps were found in the descending colon       and transverse colon. The polyps were 5 to 8 mm in size. These polyps       were removed with a cold snare. Resection and retrieval were complete.      The exam was otherwise without abnormality. Impression:               - Non-bleeding internal hemorrhoids.                           - Three 5 to 8 mm polyps in the descending colon                            and in the transverse colon, removed with a cold                            snare. Resected and retrieved.                           - The examination was otherwise normal. Moderate Sedation:      Per Anesthesia Care Recommendation:           - Patient has a contact number available for                            emergencies. The signs and symptoms of potential                            delayed complications were discussed with the                            patient. Return to  normal activities tomorrow.                            Written discharge instructions were provided to the                            patient.                           - Resume previous diet.                           - Continue present medications.                           - Await pathology results.                           - Repeat colonoscopy in 5-7 years depending on                            pathology results for surveillance.                           - Return to GI clinic PRN. Procedure Code(s):        --- Professional ---                           [phone], Colonoscopy, flexible; with removal of                             tumor(s), polyp(s), or other lesion(s) by snare                            technique Diagnosis Code(s):        --- Professional ---                           Z12.11, Encounter for screening for malignant                            neoplasm of colon                           D12.4, Benign neoplasm of descending colon                           D12.3, Benign neoplasm of transverse colon (hepatic                            flexure or splenic flexure)                           K64.8, Other hemorrhoids CPT copyright 2022 American [employer]. All rights reserved. The codes documented in this report are preliminary and upon coder review may  be revised to meet current compliance requirements. Carlin POUR. Cindie, DO Carlin POUR. Halen Mossbarger, DO 05/09/2024 9:11:08 AM This  report has been signed electronically. Number of Addenda: 0

## 2024-05-10 ENCOUNTER — Encounter (HOSPITAL_COMMUNITY): Payer: Self-pay | Admitting: Internal Medicine

## 2024-05-10 LAB — SURGICAL PATHOLOGY

## 2024-05-10 NOTE — Anesthesia Postprocedure Evaluation (Signed)
 Anesthesia Post Note  Patient: Travis Mcdonald  Procedure(s) Performed: COLONOSCOPY  Patient location during evaluation: Phase II Anesthesia Type: General Level of consciousness: awake Pain management: pain level controlled Vital Signs Assessment: post-procedure vital signs reviewed and stable Respiratory status: spontaneous breathing and respiratory function stable Cardiovascular status: blood pressure returned to baseline and stable Postop Assessment: no headache and no apparent nausea or vomiting Anesthetic complications: no Comments: Late entry   No notable events documented.   Last Vitals:  Vitals:   05/09/24 0753 05/09/24 0909  BP: (!) 141/74 (!) 111/57  Pulse: (!) 56 61  Resp: 18 16  Temp: 36.7 C 36.5 C  SpO2: 97% 96%    Last Pain:  Vitals:   05/09/24 0914  TempSrc:   PainSc: 0-No pain                 Yvonna JINNY Bosworth

## 2024-06-07 ENCOUNTER — Ambulatory Visit: Payer: Managed Care, Other (non HMO) | Admitting: Orthopedic Surgery
